# Patient Record
Sex: Female | Born: 1940 | Race: White | Hispanic: No | Smoking: Never smoker
Health system: Southern US, Community
[De-identification: ages and names within clinical notes are randomized; demographics above are authoritative.]

## PROBLEM LIST (undated history)

## (undated) DIAGNOSIS — I499 Cardiac arrhythmia, unspecified: Secondary | ICD-10-CM

## (undated) DIAGNOSIS — E079 Disorder of thyroid, unspecified: Secondary | ICD-10-CM

## (undated) DIAGNOSIS — J45909 Unspecified asthma, uncomplicated: Secondary | ICD-10-CM

## (undated) DIAGNOSIS — R002 Palpitations: Secondary | ICD-10-CM

## (undated) DIAGNOSIS — I10 Essential (primary) hypertension: Secondary | ICD-10-CM

## (undated) DIAGNOSIS — G4733 Obstructive sleep apnea (adult) (pediatric): Secondary | ICD-10-CM

## (undated) DIAGNOSIS — R7302 Impaired glucose tolerance (oral): Secondary | ICD-10-CM

## (undated) DIAGNOSIS — R011 Cardiac murmur, unspecified: Secondary | ICD-10-CM

## (undated) DIAGNOSIS — T7840XA Allergy, unspecified, initial encounter: Secondary | ICD-10-CM

## (undated) DIAGNOSIS — M8580 Other specified disorders of bone density and structure, unspecified site: Secondary | ICD-10-CM

## (undated) DIAGNOSIS — J439 Emphysema, unspecified: Secondary | ICD-10-CM

## (undated) DIAGNOSIS — R42 Dizziness and giddiness: Secondary | ICD-10-CM

## (undated) DIAGNOSIS — Z9889 Other specified postprocedural states: Secondary | ICD-10-CM

## (undated) DIAGNOSIS — G250 Essential tremor: Secondary | ICD-10-CM

## (undated) DIAGNOSIS — J449 Chronic obstructive pulmonary disease, unspecified: Secondary | ICD-10-CM

## (undated) DIAGNOSIS — Z90710 Acquired absence of both cervix and uterus: Secondary | ICD-10-CM

## (undated) HISTORY — DX: Emphysema, unspecified: J43.9

## (undated) HISTORY — DX: Acquired absence of both cervix and uterus: Z90.710

## (undated) HISTORY — DX: Cardiac arrhythmia, unspecified: I49.9

## (undated) HISTORY — DX: Unspecified asthma, uncomplicated: J45.909

## (undated) HISTORY — DX: Dizziness and giddiness: R42

## (undated) HISTORY — DX: Cardiac murmur, unspecified: R01.1

## (undated) HISTORY — DX: Obstructive sleep apnea (adult) (pediatric): G47.33

## (undated) HISTORY — DX: Disorder of thyroid, unspecified: E07.9

## (undated) HISTORY — DX: Allergy, unspecified, initial encounter: T78.40XA

## (undated) HISTORY — DX: Essential tremor: G25.0

## (undated) HISTORY — DX: Chronic obstructive pulmonary disease, unspecified: J44.9

## (undated) HISTORY — DX: Palpitations: R00.2

## (undated) HISTORY — PX: TRIGGER FINGER RELEASE: SHX641

## (undated) HISTORY — DX: Impaired glucose tolerance (oral): R73.02

## (undated) HISTORY — DX: Essential (primary) hypertension: I10

## (undated) HISTORY — PX: OTHER SURGICAL HISTORY: SHX169

## (undated) HISTORY — PX: CATARACT EXTRACTION: SUR2

## (undated) HISTORY — DX: Other specified disorders of bone density and structure, unspecified site: M85.80

---

## 2005-10-29 ENCOUNTER — Emergency Department (HOSPITAL_COMMUNITY): Admission: EM | Admit: 2005-10-29 | Discharge: 2005-10-29 | Payer: Self-pay | Admitting: Emergency Medicine

## 2005-10-30 ENCOUNTER — Ambulatory Visit (HOSPITAL_COMMUNITY): Admission: RE | Admit: 2005-10-30 | Discharge: 2005-10-30 | Payer: Self-pay | Admitting: Internal Medicine

## 2005-11-09 ENCOUNTER — Ambulatory Visit: Payer: Self-pay | Admitting: Gastroenterology

## 2005-11-09 ENCOUNTER — Encounter: Admission: RE | Admit: 2005-11-09 | Discharge: 2005-11-09 | Payer: Self-pay | Admitting: Internal Medicine

## 2005-11-25 ENCOUNTER — Ambulatory Visit (HOSPITAL_COMMUNITY): Admission: RE | Admit: 2005-11-25 | Discharge: 2005-11-25 | Payer: Self-pay | Admitting: Gastroenterology

## 2006-01-21 ENCOUNTER — Ambulatory Visit: Payer: Self-pay | Admitting: Gastroenterology

## 2006-02-01 ENCOUNTER — Ambulatory Visit: Payer: Self-pay | Admitting: Cardiovascular Disease

## 2006-02-04 ENCOUNTER — Ambulatory Visit: Payer: Self-pay | Admitting: Gastroenterology

## 2006-03-21 ENCOUNTER — Ambulatory Visit: Payer: Self-pay | Admitting: Gastroenterology

## 2006-04-29 ENCOUNTER — Ambulatory Visit (HOSPITAL_COMMUNITY): Admission: RE | Admit: 2006-04-29 | Discharge: 2006-04-29 | Payer: Self-pay | Admitting: Gastroenterology

## 2009-04-21 DIAGNOSIS — R7309 Other abnormal glucose: Secondary | ICD-10-CM | POA: Insufficient documentation

## 2009-04-21 DIAGNOSIS — E039 Hypothyroidism, unspecified: Secondary | ICD-10-CM | POA: Insufficient documentation

## 2009-04-21 DIAGNOSIS — H9319 Tinnitus, unspecified ear: Secondary | ICD-10-CM | POA: Insufficient documentation

## 2009-04-21 DIAGNOSIS — G250 Essential tremor: Secondary | ICD-10-CM | POA: Insufficient documentation

## 2009-05-29 DIAGNOSIS — E559 Vitamin D deficiency, unspecified: Secondary | ICD-10-CM | POA: Insufficient documentation

## 2010-06-11 DIAGNOSIS — E785 Hyperlipidemia, unspecified: Secondary | ICD-10-CM | POA: Insufficient documentation

## 2010-11-12 ENCOUNTER — Encounter: Payer: Self-pay | Admitting: Internal Medicine

## 2010-12-02 ENCOUNTER — Encounter: Payer: Self-pay | Admitting: Internal Medicine

## 2010-12-09 ENCOUNTER — Encounter: Payer: Self-pay | Admitting: Internal Medicine

## 2010-12-24 ENCOUNTER — Encounter: Payer: Self-pay | Admitting: Internal Medicine

## 2011-01-21 ENCOUNTER — Ambulatory Visit (INDEPENDENT_AMBULATORY_CARE_PROVIDER_SITE_OTHER): Payer: Medicare Other | Admitting: Internal Medicine

## 2011-01-21 ENCOUNTER — Encounter: Payer: Self-pay | Admitting: Internal Medicine

## 2011-01-21 DIAGNOSIS — R002 Palpitations: Secondary | ICD-10-CM

## 2011-01-21 DIAGNOSIS — I4949 Other premature depolarization: Secondary | ICD-10-CM

## 2011-01-21 DIAGNOSIS — I10 Essential (primary) hypertension: Secondary | ICD-10-CM

## 2011-01-21 DIAGNOSIS — I493 Ventricular premature depolarization: Secondary | ICD-10-CM

## 2011-01-21 MED ORDER — FLECAINIDE ACETATE 50 MG PO TABS: 50.0000 mg | ORAL_TABLET | Freq: Two times a day (BID) | ORAL | Status: DC

## 2011-01-21 NOTE — Patient Instructions (Addendum)
Your physician has requested that you have an exercise tolerance test. For further information please visit [email or web address] Please also follow instruction sheet, as given.--3-4 weeks with DrTaylor       Your physician has recommended you make the following change in your medication:  1) Start Flecainide 50mg  one twice daily 2) Decrease Metoprolol to 50mg  daily (1/2 of a 100mg  tablet)

## 2011-01-21 NOTE — Assessment & Plan Note (Signed)
The patient's symptoms appear to be related to her PVCs. We will try to suppress these.

## 2011-01-21 NOTE — Progress Notes (Signed)
HPI Kirsten [name] is referred by Dr. Clelia Croft and [facility] for evaluation of palpitations and documented PVCs and PACs. The patient is a very pleasant 70 year old woman with a history of hypertension. She has had palpitations for several months. She has never had syncope. She recently worked cardiac monitor which demonstrated symptomatic PACs and PVCs. The patient denies chest pain or shortness of breath. No Known Allergies   Current Outpatient Prescriptions  Medication Sig Dispense Refill  . amLODipine-benazepril (LOTREL) 5-20 MG per capsule Take 1 capsule by mouth daily.        Marland Kitchen aspirin 81 MG tablet Take 81 mg by mouth daily.        . Boswellia-Glucosamine-Vit D (OSTEO BI-FLEX/5-LOXIN ADVANCED) TABS Take 1 tablet by mouth daily.        Marland Kitchen CINNAMON PO Take 1,000 mg by mouth daily.        . cycloSPORINE (RESTASIS) 0.05 % ophthalmic emulsion Place 1 drop into both eyes 2 (two) times daily. Every 2 days       . estrogens, conjugated, (PREMARIN) 0.9 MG tablet Take 0.9 mg by mouth daily. Take daily for 21 days then do not take for 7 days.       . hydrochlorothiazide (MICROZIDE) 12.5 MG capsule Take 12.5 mg by mouth daily.        Marland Kitchen levothyroxine (SYNTHROID, LEVOTHROID) 50 MCG tablet Take 50 mcg by mouth daily.        . metoprolol (LOPRESSOR) 100 MG tablet Take 0.5 tablets (50 mg total) by mouth daily.      . Vitamin D, Ergocalciferol, (DRISDOL) 50000 UNITS CAPS Take 50,000 Units by mouth. Twice a month       . zolpidem (AMBIEN) 10 MG tablet Take 10 mg by mouth at bedtime as needed. 1 tablet a week       . flecainide (TAMBOCOR) 50 MG tablet Take 1 tablet (50 mg total) by mouth 2 (two) times daily.  60 tablet  11  . DISCONTD: flecainide (TAMBOCOR) 50 MG tablet Take 1 tablet (50 mg total) by mouth 2 (two) times daily.  60 tablet  11  . DISCONTD: flecainide (TAMBOCOR) 50 MG tablet Take 1 tablet (50 mg total) by mouth 2 (two) times daily.  60 tablet  11     Past Medical History  Diagnosis Date  . HTN  (hypertension)   . H/O: hysterectomy   . IGT (impaired glucose tolerance)   . Osteopenia   . Palpitations   . Heart murmur   . Tinnitus   . Vertigo   . HTN (hypertension)   . Tremor, essential     ROS:   All systems reviewed and negative except as noted in the HPI.   Past Surgical History  Procedure Date  . Orthopedic procedure      No family history on file.   History   Social History  . Marital Status: Divorced    Spouse Name: N/A    Number of Children: 0  . Years of Education: N/A   Occupational History  . retired Armed forces logistics/support/administrative officer for [employer]    Social History Main Topics  . Smoking status: Never Smoker   . Smokeless tobacco: Not on file  . Alcohol Use: No  . Drug Use: No  . Sexually Active: Not on file   Other Topics Concern  . Not on file   Social History Narrative  . No narrative on file     BP 106/50  Pulse 48  Ht 5' (  1.524 m)  Wt 55.339 kg (122 lb)  BMI 23.83 kg/m2  Physical Exam:  Well appearing woman, NAD HEENT: Unremarkable Neck:  No JVD, no thyromegally Lymphatics:  No adenopathy Back:  No CVA tenderness Lungs:  Clear with no wheezes, rales, or rhonchi. HEART:  Regular rate rhythm, no murmurs, no rubs, no clicks Abd:  soft, positive bowel sounds, no organomegally, no rebound, no guarding Ext:  2 plus pulses, no edema, no cyanosis, no clubbing Skin:  No rashes no nodules Neuro:  CN II through XII intact, motor grossly intact  EKG Sinus bradycardia.  Assess/Plan:

## 2011-01-21 NOTE — Assessment & Plan Note (Signed)
The patient has continued to have symptoms despite medical therapy with beta blockers. These initially helped but over the last few months she has had increasingly frequent palpitations which are quite bothersome to her. After discussing the treatment options with the patient and reviewing her EKG and cardiac monitor I have recommended that we start low dose flecainide. She will reduce her metoprolol from 100 mg daily to 50 mg daily. She will start flecainide 50 mg twice daily. I will have her back for an exercise treadmill test to rule out exercise induced proarrhythmia on flecainide.

## 2011-02-04 ENCOUNTER — Telehealth: Payer: Self-pay | Admitting: Internal Medicine

## 2011-02-04 DIAGNOSIS — I493 Ventricular premature depolarization: Secondary | ICD-10-CM

## 2011-02-04 MED ORDER — FLECAINIDE ACETATE 50 MG PO TABS: 50.0000 mg | ORAL_TABLET | Freq: Two times a day (BID) | ORAL | Status: DC

## 2011-02-04 NOTE — Telephone Encounter (Signed)
New message:  Patient needs a new pres called [facility] for a one year supply of Flecainide. Pt is changing pharmacy.  Please call pt if any questions. [phone]  Cell [phone]

## 2011-03-03 ENCOUNTER — Ambulatory Visit (INDEPENDENT_AMBULATORY_CARE_PROVIDER_SITE_OTHER): Payer: Medicare Other | Admitting: Internal Medicine

## 2011-03-03 DIAGNOSIS — I4949 Other premature depolarization: Secondary | ICD-10-CM | POA: Diagnosis not present

## 2011-03-03 DIAGNOSIS — I493 Ventricular premature depolarization: Secondary | ICD-10-CM

## 2011-03-03 DIAGNOSIS — R002 Palpitations: Secondary | ICD-10-CM

## 2011-03-03 NOTE — Progress Notes (Signed)
Exercise Treadmill Test  Pre-Exercise Testing Evaluation Rhythm: sinus bradycardia  Rate: 45   PR:  18 QRS:  .09  QT:  51 QTc: 44     Test  Exercise Tolerance Test Ordering MD: Lewayne Bunting, MD  Interpreting MD:  Lewayne Bunting, MD  Unique Test No: 1  Treadmill:  1  Indication for ETT: Flecainide  Contraindication to ETT: No   Stress Modality: exercise - treadmill  Cardiac Imaging Performed: non   Protocol: sub max Bruce Max BP:  121/56  Max MPHR (bpm):  151 85% MPR (bpm):  128  MPHR obtained (bpm):  77 % MPHR obtained:  n/a  Reached 85% MPHR (min:sec):  n/a Total Exercise Time (min-sec): 12:00  Workload in METS:  7.0 Borg Scale: 15  Reason ETT Terminated:  patient's desire to stop    ST Segment Analysis At Rest: normal ST segments - no evidence of significant ST depression With Exercise: no evidence of significant ST depression  Other Information Arrhythmia:  No Angina during ETT:  absent (0) Quality of ETT:  diagnostic  ETT Interpretation:  normal - no evidence of ischemia by ST analysis  Comments: Bradycardic heart rate response to exercise.  Recommendations: Continue current meds.

## 2011-06-17 ENCOUNTER — Encounter: Payer: Self-pay | Admitting: Gastroenterology

## 2011-06-17 DIAGNOSIS — R7301 Impaired fasting glucose: Secondary | ICD-10-CM | POA: Diagnosis not present

## 2011-06-17 DIAGNOSIS — M899 Disorder of bone, unspecified: Secondary | ICD-10-CM | POA: Diagnosis not present

## 2011-06-17 DIAGNOSIS — I10 Essential (primary) hypertension: Secondary | ICD-10-CM | POA: Diagnosis not present

## 2011-06-17 DIAGNOSIS — E559 Vitamin D deficiency, unspecified: Secondary | ICD-10-CM | POA: Diagnosis not present

## 2011-06-17 DIAGNOSIS — E785 Hyperlipidemia, unspecified: Secondary | ICD-10-CM | POA: Diagnosis not present

## 2011-06-17 DIAGNOSIS — E039 Hypothyroidism, unspecified: Secondary | ICD-10-CM | POA: Diagnosis not present

## 2011-06-17 DIAGNOSIS — M949 Disorder of cartilage, unspecified: Secondary | ICD-10-CM | POA: Diagnosis not present

## 2011-06-24 DIAGNOSIS — I10 Essential (primary) hypertension: Secondary | ICD-10-CM | POA: Diagnosis not present

## 2011-06-24 DIAGNOSIS — I499 Cardiac arrhythmia, unspecified: Secondary | ICD-10-CM | POA: Insufficient documentation

## 2011-06-24 DIAGNOSIS — E785 Hyperlipidemia, unspecified: Secondary | ICD-10-CM | POA: Diagnosis not present

## 2011-06-24 DIAGNOSIS — Z0000 Encounter for general adult medical examination without abnormal findings: Secondary | ICD-10-CM | POA: Diagnosis not present

## 2011-06-24 DIAGNOSIS — E039 Hypothyroidism, unspecified: Secondary | ICD-10-CM | POA: Diagnosis not present

## 2011-06-25 DIAGNOSIS — Z1212 Encounter for screening for malignant neoplasm of rectum: Secondary | ICD-10-CM | POA: Diagnosis not present

## 2011-07-14 DIAGNOSIS — N72 Inflammatory disease of cervix uteri: Secondary | ICD-10-CM | POA: Diagnosis not present

## 2011-07-26 ENCOUNTER — Ambulatory Visit (INDEPENDENT_AMBULATORY_CARE_PROVIDER_SITE_OTHER): Payer: Medicare Other | Admitting: Gastroenterology

## 2011-07-26 ENCOUNTER — Encounter: Payer: Self-pay | Admitting: Gastroenterology

## 2011-07-26 VITALS — BP 108/60 | HR 56 | Ht 60.0 in | Wt 119.8 lb

## 2011-07-26 DIAGNOSIS — Z1211 Encounter for screening for malignant neoplasm of colon: Secondary | ICD-10-CM

## 2011-07-26 NOTE — Progress Notes (Signed)
HPI: This is a   very pleasant 71 year old woman whom I last saw about 6 years ago for pancreatic imaging abnormality. I performed an endoscopic ultrasound, there is nothing significant appearing on that examination. She had a followup MRI of her pancreas in 2008 and it was completely normal. I suggested no further surveillance.  She had a colonoscopy about 8 years ago, she was told to have this again in 7 years.  It was "perfect."  No polyps or cancer.  No FH of colon cancer.  No changes in bowels no blood in her stool.  Overall she has lost 35 pounds, intentionally with dieting.    Review of systems: Pertinent positive and negative review of systems were noted in the above HPI section. Complete review of systems was performed and was otherwise normal.    Past Medical History  Diagnosis Date  . HTN (hypertension)   . H/O: hysterectomy   . IGT (impaired glucose tolerance)   . Osteopenia   . Palpitations   . Heart murmur   . Tinnitus   . Vertigo   . HTN (hypertension)   . Tremor, essential     Past Surgical History  Procedure Date  . Orthopedic procedure     Current Outpatient Prescriptions  Medication Sig Dispense Refill  . amLODipine-benazepril (LOTREL) 5-20 MG per capsule Take 1 capsule by mouth daily.        Marland Kitchen aspirin 81 MG tablet Take 81 mg by mouth daily.        . Boswellia-Glucosamine-Vit D (OSTEO BI-FLEX/5-LOXIN ADVANCED) TABS Take 1 tablet by mouth daily.        Marland Kitchen CINNAMON PO Take 1,000 mg by mouth daily.        . cycloSPORINE (RESTASIS) 0.05 % ophthalmic emulsion Place 1 drop into both eyes 2 (two) times daily. Every 2 days       . estrogens, conjugated, (PREMARIN) 0.9 MG tablet Take 0.9 mg by mouth daily. Take daily for 21 days then do not take for 7 days.       . flecainide (TAMBOCOR) 50 MG tablet Take 1 tablet (50 mg total) by mouth 2 (two) times daily.  180 tablet  3  . hydrochlorothiazide (MICROZIDE) 12.5 MG capsule Take 12.5 mg by mouth daily.        Marland Kitchen  levothyroxine (SYNTHROID, LEVOTHROID) 50 MCG tablet Take 50 mcg by mouth daily.        . metoprolol (LOPRESSOR) 100 MG tablet Take 0.5 tablets (50 mg total) by mouth daily.      . Vitamin D, Ergocalciferol, (DRISDOL) 50000 UNITS CAPS Take 50,000 Units by mouth. Twice a month       . zolpidem (AMBIEN) 10 MG tablet Take 10 mg by mouth at bedtime as needed. 1 tablet a week         Allergies as of 07/26/2011  . (No Known Allergies)    No family history on file.  History   Social History  . Marital Status: Divorced    Spouse Name: N/A    Number of Children: 0  . Years of Education: N/A   Occupational History  . retired Armed forces logistics/support/administrative officer for [employer]    Social History Main Topics  . Smoking status: Never Smoker   . Smokeless tobacco: Never Used  . Alcohol Use: No  . Drug Use: No  . Sexually Active: Not on file   Other Topics Concern  . Not on file   Social History Narrative  . No  narrative on file       Physical Exam: BP 108/60  Pulse 56  Ht 5' (1.524 m)  Wt 119 lb 12.8 oz (54.341 kg)  BMI 23.40 kg/m2 Constitutional: generally well-appearing Psychiatric: alert and oriented x3 Eyes: extraocular movements intact Mouth: oral pharynx moist, no lesions Neck: supple no lymphadenopathy Cardiovascular: heart regular rate and rhythm Lungs: clear to auscultation bilaterally Abdomen: soft, nontender, nondistended, no obvious ascites, no peritoneal signs, normal bowel sounds Extremities: no lower extremity edema bilaterally Skin: no lesions on visible extremities    Assessment and plan: 71 y.o. female with  routine risk for colon cancer, no new GI symptoms  The last colonoscopy was about 8 years ago, she was told that a repeat 7 year interval. Those national consensus guidelines has since changed and she is probably safe for another 2 years before repeating colonoscopy. She does not require annual fecal occult testing in the interim. This assumes she has no  significant GI symptoms and she does not. We will therefore put her in our reminder system for repeat colonoscopy in the fall of 2015. She does not remember who did her previous colonoscopy or where it was done exactly but she is very certain about the date and the findings it was completely normal.

## 2011-07-26 NOTE — Patient Instructions (Signed)
Recall colonoscopy for routine risk September 2015 as long as you have no symptoms. You do not need annual fecal occult testing for colon cancer screening. A copy of this information will be made available to Dr. Clelia Croft.

## 2011-10-21 DIAGNOSIS — Z1231 Encounter for screening mammogram for malignant neoplasm of breast: Secondary | ICD-10-CM | POA: Diagnosis not present

## 2011-10-27 ENCOUNTER — Ambulatory Visit: Payer: Medicare Other | Admitting: Internal Medicine

## 2011-11-22 ENCOUNTER — Ambulatory Visit (INDEPENDENT_AMBULATORY_CARE_PROVIDER_SITE_OTHER): Payer: Medicare Other | Admitting: Internal Medicine

## 2011-11-22 ENCOUNTER — Encounter: Payer: Self-pay | Admitting: Internal Medicine

## 2011-11-22 VITALS — BP 118/57 | HR 42 | Ht 60.0 in | Wt 119.8 lb

## 2011-11-22 DIAGNOSIS — I493 Ventricular premature depolarization: Secondary | ICD-10-CM

## 2011-11-22 DIAGNOSIS — I4949 Other premature depolarization: Secondary | ICD-10-CM

## 2011-11-22 DIAGNOSIS — R002 Palpitations: Secondary | ICD-10-CM

## 2011-11-22 DIAGNOSIS — I10 Essential (primary) hypertension: Secondary | ICD-10-CM | POA: Diagnosis not present

## 2011-11-22 MED ORDER — FLECAINIDE ACETATE 50 MG PO TABS: 50.0000 mg | ORAL_TABLET | Freq: Two times a day (BID) | ORAL | Status: DC

## 2011-11-22 NOTE — Progress Notes (Signed)
HPI Kirsten [name] returns today for followup. She is a very pleasant 71 year old woman with symptomatic palpitations and documented PACs and PVCs. When I saw her back almost a year ago, I recommended that she start taking flecainide. Her symptoms have markedly improved. She denies chest pain or shortness of breath. She still has an occasional palpitation but nothing like she had previously. No syncope. She does note occasional bizarre dreams. No Known Allergies   Current Outpatient Prescriptions  Medication Sig Dispense Refill  . amLODipine-benazepril (LOTREL) 5-20 MG per capsule Take 1 capsule by mouth daily.        Marland Kitchen aspirin 81 MG tablet Take 81 mg by mouth daily.        . benazepril (LOTENSIN) 20 MG tablet Take 20 mg by mouth daily.      . Boswellia-Glucosamine-Vit D (OSTEO BI-FLEX/5-LOXIN ADVANCED) TABS Take 1 tablet by mouth daily.        Marland Kitchen CINNAMON PO Take 1,000 mg by mouth daily.        . cycloSPORINE (RESTASIS) 0.05 % ophthalmic emulsion Place 1 drop into both eyes 2 (two) times daily. Every 2 days       . estrogens, conjugated, (PREMARIN) 0.9 MG tablet Take 0.9 mg by mouth daily. Take daily for 21 days then do not take for 7 days.       . flecainide (TAMBOCOR) 50 MG tablet Take 1 tablet (50 mg total) by mouth 2 (two) times daily.  180 tablet  3  . hydrochlorothiazide (MICROZIDE) 12.5 MG capsule Take 12.5 mg by mouth daily.        Marland Kitchen levothyroxine (SYNTHROID, LEVOTHROID) 50 MCG tablet Take 50 mcg by mouth daily.        . metoprolol (LOPRESSOR) 100 MG tablet Take 0.5 tablets (50 mg total) by mouth daily.      . Vitamin D, Ergocalciferol, (DRISDOL) 50000 UNITS CAPS Take 50,000 Units by mouth. Twice a month       . zolpidem (AMBIEN) 10 MG tablet Take 10 mg by mouth at bedtime as needed. 1 tablet a week          Past Medical History  Diagnosis Date  . HTN (hypertension)   . H/O: hysterectomy   . IGT (impaired glucose tolerance)   . Osteopenia   . Palpitations   . Heart murmur   .  Tinnitus   . Vertigo   . HTN (hypertension)   . Tremor, essential     ROS:   All systems reviewed and negative except as noted in the HPI.   Past Surgical History  Procedure Date  . Orthopedic procedure      No family history on file.   History   Social History  . Marital Status: Divorced    Spouse Name: N/A    Number of Children: 0  . Years of Education: N/A   Occupational History  . retired Armed forces logistics/support/administrative officer for [employer]    Social History Main Topics  . Smoking status: Never Smoker   . Smokeless tobacco: Never Used  . Alcohol Use: No  . Drug Use: No  . Sexually Active: Not on file   Other Topics Concern  . Not on file   Social History Narrative  . No narrative on file     BP 118/57  Pulse 42  Ht 5' (1.524 m)  Wt 119 lb 12.8 oz (54.341 kg)  BMI 23.40 kg/m2  Physical Exam:  Well appearing 71 year old woman, NAD HEENT: Unremarkable Neck:  No JVD, no thyromegally Lungs:  Clear with no wheezes, rales, or rhonchi. HEART:  Regular bradycardic rhythm, no murmurs, no rubs, no clicks Abd:  soft, positive bowel sounds, no organomegally, no rebound, no guarding Ext:  2 plus pulses, no edema, no cyanosis, no clubbing Skin:  No rashes no nodules Neuro:  CN II through XII intact, motor grossly intact  EKG Sinus bradycardia  Assess/Plan:

## 2011-11-22 NOTE — Patient Instructions (Addendum)
SENT IN A ONE YEAR SUPPLY FOR YOUR FLECANIDE  Your physician wants you to follow-up in: ONE YEAR WITH DR. Ladona Ridgel. You will receive a reminder letter in the mail two months in advance. If you don't receive a letter, please call our office to schedule the follow-up appointment.   Your physician recommends that you continue on your current medications as directed. Please refer to the Current Medication list given to you today.

## 2011-11-22 NOTE — Assessment & Plan Note (Signed)
Her symptoms remain well-controlled. She will continue low-dose flecainide, and low-dose beta blocker. I plan to see her back in one year.

## 2011-11-22 NOTE — Assessment & Plan Note (Signed)
Her blood pressure is well controlled. She will continue her current medical therapy and maintain a low-sodium diet. 

## 2011-12-18 DIAGNOSIS — Z23 Encounter for immunization: Secondary | ICD-10-CM | POA: Diagnosis not present

## 2012-02-05 ENCOUNTER — Other Ambulatory Visit: Payer: Self-pay | Admitting: Internal Medicine

## 2012-06-30 ENCOUNTER — Encounter: Payer: Self-pay | Admitting: Internal Medicine

## 2012-06-30 DIAGNOSIS — R7301 Impaired fasting glucose: Secondary | ICD-10-CM | POA: Diagnosis not present

## 2012-06-30 DIAGNOSIS — E785 Hyperlipidemia, unspecified: Secondary | ICD-10-CM | POA: Diagnosis not present

## 2012-06-30 DIAGNOSIS — E039 Hypothyroidism, unspecified: Secondary | ICD-10-CM | POA: Diagnosis not present

## 2012-06-30 DIAGNOSIS — I10 Essential (primary) hypertension: Secondary | ICD-10-CM | POA: Diagnosis not present

## 2012-06-30 DIAGNOSIS — E559 Vitamin D deficiency, unspecified: Secondary | ICD-10-CM | POA: Diagnosis not present

## 2012-07-06 DIAGNOSIS — I10 Essential (primary) hypertension: Secondary | ICD-10-CM | POA: Diagnosis not present

## 2012-07-06 DIAGNOSIS — I498 Other specified cardiac arrhythmias: Secondary | ICD-10-CM | POA: Diagnosis not present

## 2012-07-06 DIAGNOSIS — Z1331 Encounter for screening for depression: Secondary | ICD-10-CM | POA: Diagnosis not present

## 2012-07-06 DIAGNOSIS — E039 Hypothyroidism, unspecified: Secondary | ICD-10-CM | POA: Diagnosis not present

## 2012-07-06 DIAGNOSIS — R7301 Impaired fasting glucose: Secondary | ICD-10-CM | POA: Diagnosis not present

## 2012-07-06 DIAGNOSIS — M899 Disorder of bone, unspecified: Secondary | ICD-10-CM | POA: Diagnosis not present

## 2012-07-06 DIAGNOSIS — M949 Disorder of cartilage, unspecified: Secondary | ICD-10-CM | POA: Diagnosis not present

## 2012-07-06 DIAGNOSIS — E785 Hyperlipidemia, unspecified: Secondary | ICD-10-CM | POA: Diagnosis not present

## 2012-07-06 DIAGNOSIS — Z0000 Encounter for general adult medical examination without abnormal findings: Secondary | ICD-10-CM | POA: Diagnosis not present

## 2012-07-06 DIAGNOSIS — E559 Vitamin D deficiency, unspecified: Secondary | ICD-10-CM | POA: Diagnosis not present

## 2012-07-12 DIAGNOSIS — Z1212 Encounter for screening for malignant neoplasm of rectum: Secondary | ICD-10-CM | POA: Diagnosis not present

## 2012-07-13 DIAGNOSIS — N8111 Cystocele, midline: Secondary | ICD-10-CM | POA: Diagnosis not present

## 2012-07-13 DIAGNOSIS — N760 Acute vaginitis: Secondary | ICD-10-CM | POA: Diagnosis not present

## 2012-07-13 DIAGNOSIS — Z01419 Encounter for gynecological examination (general) (routine) without abnormal findings: Secondary | ICD-10-CM | POA: Diagnosis not present

## 2012-07-13 DIAGNOSIS — I10 Essential (primary) hypertension: Secondary | ICD-10-CM | POA: Diagnosis not present

## 2012-07-14 ENCOUNTER — Other Ambulatory Visit (HOSPITAL_COMMUNITY): Payer: Self-pay | Admitting: Internal Medicine

## 2012-07-24 DIAGNOSIS — R0609 Other forms of dyspnea: Secondary | ICD-10-CM | POA: Diagnosis not present

## 2012-07-26 ENCOUNTER — Ambulatory Visit (HOSPITAL_COMMUNITY)
Admission: RE | Admit: 2012-07-26 | Discharge: 2012-07-26 | Disposition: A | Discharge status: Home / Self Care | Payer: Medicare Other | Source: Ambulatory Visit | Attending: Internal Medicine | Admitting: Internal Medicine

## 2012-07-26 DIAGNOSIS — R0609 Other forms of dyspnea: Secondary | ICD-10-CM | POA: Insufficient documentation

## 2012-07-26 DIAGNOSIS — R0989 Other specified symptoms and signs involving the circulatory and respiratory systems: Secondary | ICD-10-CM | POA: Insufficient documentation

## 2012-07-26 LAB — PULMONARY FUNCTION TEST

## 2012-07-26 MED ORDER — ALBUTEROL SULFATE (5 MG/ML) 0.5% IN NEBU
2.5000 mg | INHALATION_SOLUTION | Freq: Once | RESPIRATORY_TRACT | Status: AC
  Administered 2012-07-26: 2.5 mg via RESPIRATORY_TRACT

## 2012-08-14 DIAGNOSIS — R0989 Other specified symptoms and signs involving the circulatory and respiratory systems: Secondary | ICD-10-CM | POA: Diagnosis not present

## 2012-08-14 DIAGNOSIS — I498 Other specified cardiac arrhythmias: Secondary | ICD-10-CM | POA: Diagnosis not present

## 2012-08-14 DIAGNOSIS — IMO0002 Reserved for concepts with insufficient information to code with codable children: Secondary | ICD-10-CM | POA: Diagnosis not present

## 2012-08-14 DIAGNOSIS — J449 Chronic obstructive pulmonary disease, unspecified: Secondary | ICD-10-CM | POA: Diagnosis not present

## 2012-08-16 ENCOUNTER — Ambulatory Visit (INDEPENDENT_AMBULATORY_CARE_PROVIDER_SITE_OTHER): Payer: Medicare Other | Admitting: Pulmonary Disease

## 2012-08-16 ENCOUNTER — Institutional Professional Consult (permissible substitution): Payer: Medicare Other | Admitting: Pulmonary Disease

## 2012-08-16 ENCOUNTER — Encounter: Payer: Self-pay | Admitting: Pulmonary Disease

## 2012-08-16 VITALS — BP 98/62 | HR 63 | Temp 98.0°F | Ht 59.0 in | Wt 114.0 lb

## 2012-08-16 DIAGNOSIS — J449 Chronic obstructive pulmonary disease, unspecified: Secondary | ICD-10-CM

## 2012-08-16 DIAGNOSIS — J4489 Other specified chronic obstructive pulmonary disease: Secondary | ICD-10-CM

## 2012-08-16 MED ORDER — MOMETASONE FURO-FORMOTEROL FUM 100-5 MCG/ACT IN AERO: 2.0000 | INHALATION_SPRAY | Freq: Two times a day (BID) | RESPIRATORY_TRACT | Status: DC

## 2012-08-16 NOTE — Progress Notes (Deleted)
  Subjective:    Patient ID: Kirsten [name], female    DOB: 08-26-1940, 72 y.o.   MRN: [id number]  HPI    Review of Systems  Constitutional: Positive for unexpected weight change. Negative for fever, chills, diaphoresis, activity change, appetite change and fatigue.  HENT: Positive for congestion, sneezing and dental problem. Negative for hearing loss, ear pain, nosebleeds, sore throat, facial swelling, rhinorrhea, mouth sores, trouble swallowing, neck pain, neck stiffness, voice change, postnasal drip, sinus pressure, tinnitus and ear discharge.   Eyes: Negative for photophobia, discharge, itching and visual disturbance.  Respiratory: Positive for cough and shortness of breath. Negative for apnea, choking, chest tightness, wheezing and stridor.   Cardiovascular: Negative for chest pain, palpitations and leg swelling.  Gastrointestinal: Negative for nausea, vomiting, abdominal pain, constipation, blood in stool and abdominal distention.  Genitourinary: Negative for dysuria, urgency, frequency, hematuria, flank pain, decreased urine volume and difficulty urinating.  Musculoskeletal: Positive for joint swelling. Negative for myalgias, back pain, arthralgias and gait problem.  Skin: Negative for color change, pallor and rash.  Neurological: Negative for dizziness, tremors, seizures, syncope, speech difficulty, weakness, light-headedness, numbness and headaches.  Hematological: Negative for adenopathy. Does not bruise/bleed easily.  Psychiatric/Behavioral: Negative for confusion, sleep disturbance and agitation. The patient is not nervous/anxious.        Objective:   Physical Exam        Assessment & Plan:

## 2012-08-16 NOTE — Progress Notes (Signed)
Chief Complaint  Patient presents with  . Pulmonary Consult    referred by Dr. Clelia Croft for COPD    History of Present Illness: Kirsten [name] is a 72 y.o. female never smoker for evaluation of dyspnea.  She has noticed trouble with her breathing for some time.  Things have gotten progressively worse for the past one year.  She now feels like she has trouble breathing even at rest, and will feel like she has to double clutch her breath when talking.  She has been getting episodes of cough with yellow to green sputum.  She also has episodes of wheezing.  She denies hemoptysis, and there is no prior history of asthma or allergies.  She never smoked.  Her husband used to smoke 1 pack per day.  She used to work with [employer], and had frequent exposures.  She was required to wear a respirator at times while working.    She gets winded after walking about 150 feet.  Her heart feels like it is pounding when she exerts herself.  She denies leg swelling or skin rash.  There is no history of thrombo-embolic disease.  She denies history of pneumonia.  There is no history of animal exposures.  She has been on ACE inhibitor for several years, and her use of this medication predates her current symptoms by several years.    She recently had a chest xray which was normal.  She also had breathing tests, and was told she has COPD.  She was prescribed spiriva and albuterol, and referred to pulmonary.  She has not started inhaler therapy yet.  Tests: CXR 07/24/12 >> no acute process PFT 07/26/12 >> FEV1 1.38 (81%), post-BD FEV1% 79, TLC 5.72 (113%), RV 3.01 (158%), DLCO 60%, +BD response  Kirsten [name]  has a past medical history of HTN (hypertension); H/O: hysterectomy; IGT (impaired glucose tolerance); Osteopenia; Palpitations; Heart murmur; Tinnitus; Vertigo; HTN (hypertension); and Tremor, essential.  Kirsten [name]  has past surgical history that includes orthopedic procedure.  Prior to Admission medications    Medication Sig Start Date End Date Taking? Authorizing Provider  albuterol (PROAIR HFA) 108 (90 BASE) MCG/ACT inhaler Inhale 2 puffs into the lungs every 6 (six) hours as needed for wheezing.   Yes Historical Provider, MD  amLODipine-benazepril (LOTREL) 5-20 MG per capsule Take 1 capsule by mouth daily.   Yes Historical Provider, MD  aspirin 81 MG tablet Take 81 mg by mouth daily.     Yes Historical Provider, MD  Boswellia-Glucosamine-Vit D (OSTEO BI-FLEX/5-LOXIN ADVANCED) TABS Take 1 tablet by mouth daily.     Yes Historical Provider, MD  CINNAMON PO Take 1,000 mg by mouth daily.     Yes Historical Provider, MD  cycloSPORINE (RESTASIS) 0.05 % ophthalmic emulsion Place 1 drop into both eyes 2 (two) times daily. Every 2 days    Yes Historical Provider, MD  estrogens, conjugated, (PREMARIN) 0.9 MG tablet Take 0.9 mg by mouth daily. Take daily for 21 days then do not take for 7 days.    Yes Historical Provider, MD  flecainide (TAMBOCOR) 50 MG tablet TAKE 1 TABLET TWICE A DAY 02/05/12  Yes Marinus Maw, MD  hydrochlorothiazide (MICROZIDE) 12.5 MG capsule Take 12.5 mg by mouth daily.     Yes Historical Provider, MD  levothyroxine (SYNTHROID, LEVOTHROID) 50 MCG tablet Take 50 mcg by mouth daily.     Yes Historical Provider, MD  tiotropium (SPIRIVA) 18 MCG inhalation capsule Place 18 mcg into inhaler  and inhale daily.   Yes Historical Provider, MD  Vitamin D, Ergocalciferol, (DRISDOL) 50000 UNITS CAPS Take 50,000 Units by mouth. Twice a month    Yes Historical Provider, MD  zolpidem (AMBIEN) 10 MG tablet Take 10 mg by mouth at bedtime as needed. 1 tablet a week    Yes Historical Provider, MD  metoprolol (LOPRESSOR) 100 MG tablet Take 0.5 tablets (50 mg total) by mouth daily. 01/21/11   Marinus Maw, MD    No Known Allergies  Her family history includes Cirrhosis in her mother; Heart disease in her maternal grandmother; Kidney disease in her mother; and Lung cancer in her father.  She  reports  that she has never smoked. She has never used smokeless tobacco. She reports that she does not drink alcohol or use illicit drugs.  Review of Systems  Constitutional: Positive for unexpected weight change. Negative for fever, chills, diaphoresis, activity change, appetite change and fatigue.  HENT: Positive for congestion, sneezing and dental problem. Negative for hearing loss, ear pain, nosebleeds, sore throat, facial swelling, rhinorrhea, mouth sores, trouble swallowing, neck pain, neck stiffness, voice change, postnasal drip, sinus pressure, tinnitus and ear discharge.   Eyes: Negative for photophobia, discharge, itching and visual disturbance.  Respiratory: Positive for cough and shortness of breath. Negative for apnea, choking, chest tightness, wheezing and stridor.   Cardiovascular: Negative for chest pain, palpitations and leg swelling.  Gastrointestinal: Negative for nausea, vomiting, abdominal pain, constipation, blood in stool and abdominal distention.  Genitourinary: Negative for dysuria, urgency, frequency, hematuria, flank pain, decreased urine volume and difficulty urinating.  Musculoskeletal: Positive for joint swelling. Negative for myalgias, back pain, arthralgias and gait problem.  Skin: Negative for color change, pallor and rash.  Neurological: Negative for dizziness, tremors, seizures, syncope, speech difficulty, weakness, light-headedness, numbness and headaches.  Hematological: Negative for adenopathy. Does not bruise/bleed easily.  Psychiatric/Behavioral: Negative for confusion, sleep disturbance and agitation. The patient is not nervous/anxious.    Physical Exam:  General - No distress, thin ENT - No sinus tenderness, no oral exudate, no LAN, no thyromegaly, TM clear, pupils equal/reactive Cardiac - s1s2 regular, no murmur, pulses symmetric Chest - No wheeze/rales/dullness, good air entry, normal respiratory excursion Back - No focal tenderness Abd - Soft, non-tender,  no organomegaly, + bowel sounds Ext - No edema Neuro - Normal strength, cranial nerves intact Skin - No rashes Psych - Normal mood, and behavior   Assessment/Plan:  Kirsten Helling, MD [place] Pager:  [phone]

## 2012-08-16 NOTE — Patient Instructions (Signed)
Spiriva one puff daily Dulera two puffs twice per day, and rinse mouth after each use Proair two puffs as needed Follow up in 4 weeks

## 2012-08-16 NOTE — Assessment & Plan Note (Signed)
She has progressive symptoms of dyspnea.  She has prior history of second hand tobacco exposure, and possible occupational exposure.  PFT shows airflow obstruction, air trapping, response to bronchodilator therapy, and mild diffusion defect.  I have reviewed her test results with her.  I have advised her to start spiriva as prescribed by Dr. Clelia Croft.  Will also give her trial of dulera (samples give).  She can use proair as needed.  Reviewed proper indications and use of her inhalers.  Depending on her symptoms status will determine if further interventions are needed.

## 2012-08-29 ENCOUNTER — Encounter: Payer: Self-pay | Admitting: Internal Medicine

## 2012-08-29 ENCOUNTER — Encounter (INDEPENDENT_AMBULATORY_CARE_PROVIDER_SITE_OTHER): Payer: Medicare Other

## 2012-08-29 ENCOUNTER — Ambulatory Visit (INDEPENDENT_AMBULATORY_CARE_PROVIDER_SITE_OTHER): Payer: Medicare Other | Admitting: Internal Medicine

## 2012-08-29 ENCOUNTER — Encounter: Payer: Self-pay | Admitting: *Deleted

## 2012-08-29 VITALS — BP 116/67 | HR 69 | Ht 60.0 in | Wt 112.2 lb

## 2012-08-29 DIAGNOSIS — I10 Essential (primary) hypertension: Secondary | ICD-10-CM | POA: Diagnosis not present

## 2012-08-29 DIAGNOSIS — R002 Palpitations: Secondary | ICD-10-CM

## 2012-08-29 MED ORDER — VERAPAMIL HCL ER 120 MG PO TBCR: 120.0000 mg | EXTENDED_RELEASE_TABLET | Freq: Every day | ORAL | Status: DC

## 2012-08-29 NOTE — Assessment & Plan Note (Signed)
The etiology of her palpitations is unclear. I recommended that she wear a 48 hour cardiac monitor to better understand her mechanism. She will initiate verapamil therapy.

## 2012-08-29 NOTE — Patient Instructions (Addendum)
Your physician has recommended that you wear a holter monitor. Holter monitors are medical devices that record the heart's electrical activity. Doctors most often use these monitors to diagnose arrhythmias. Arrhythmias are problems with the speed or rhythm of the heartbeat. The monitor is a small, portable device. You can wear one while you do your normal daily activities. This is usually used to diagnose what is causing palpitations/syncope (passing out). You will wear for 48 hours.  Your physician has recommended you make the following change in your medication: stop taking Lotrel and start taking Verapamil 120 mg daily  Your physician recommends that you schedule a follow-up appointment in: 6 to 8 weeks

## 2012-08-29 NOTE — Assessment & Plan Note (Signed)
Her blood pressure is well controlled. I've asked the patient to discontinue taking Lotrel and start taking verapamil. We'll need to keep in on her blood pressure.

## 2012-08-29 NOTE — Progress Notes (Signed)
HPI Kirsten [name] returns today for followup. She is a history of paroxysmal atrial arrhythmias and has been on flecainide therapy. In addition, the patient has had worsening palpitations, which have increased with the initiation of bronchodilator therapy. In the interim, she denies syncope or chest pain. She does have daily palpitations of uncertain etiology. She has worsening shortness of breath. She was recently seen by a pulmonologist and diagnosed with COPD and asthma. Bronchodilators were recommended that she has had difficulty with the secondary to palpitations. No Known Allergies   Current Outpatient Prescriptions  Medication Sig Dispense Refill  . albuterol (PROAIR HFA) 108 (90 BASE) MCG/ACT inhaler Inhale 2 puffs into the lungs every 6 (six) hours as needed for wheezing.      Marland Kitchen aspirin 81 MG tablet Take 81 mg by mouth daily.        . Boswellia-Glucosamine-Vit D (OSTEO BI-FLEX/5-LOXIN ADVANCED) TABS Take 1 tablet by mouth daily.        Marland Kitchen CINNAMON PO Take 1,000 mg by mouth daily.        . cycloSPORINE (RESTASIS) 0.05 % ophthalmic emulsion Place 1 drop into both eyes 2 (two) times daily. Every 2 days       . estrogens, conjugated, (PREMARIN) 0.9 MG tablet Take 0.9 mg by mouth daily. Take daily for 21 days then do not take for 7 days.       . flecainide (TAMBOCOR) 50 MG tablet TAKE 1 TABLET TWICE A DAY  180 tablet  2  . hydrochlorothiazide (MICROZIDE) 12.5 MG capsule Take 12.5 mg by mouth daily.        Marland Kitchen levothyroxine (SYNTHROID, LEVOTHROID) 50 MCG tablet Take 50 mcg by mouth daily.        . mometasone-formoterol (DULERA) 100-5 MCG/ACT AERO Inhale 2 puffs into the lungs 2 (two) times daily.  1 Inhaler  5  . tiotropium (SPIRIVA) 18 MCG inhalation capsule Place 18 mcg into inhaler and inhale daily.      . Vitamin D, Ergocalciferol, (DRISDOL) 50000 UNITS CAPS Take 50,000 Units by mouth. Twice a month       . zolpidem (AMBIEN) 10 MG tablet Take 10 mg by mouth at bedtime as needed. 1 tablet a week        . verapamil (CALAN-SR) 120 MG CR tablet Take 1 tablet (120 mg total) by mouth at bedtime.  30 tablet  2   No current facility-administered medications for this visit.     Past Medical History  Diagnosis Date  . HTN (hypertension)   . H/O: hysterectomy   . IGT (impaired glucose tolerance)   . Osteopenia   . Palpitations   . Heart murmur   . Tinnitus   . Vertigo   . HTN (hypertension)   . Tremor, essential     ROS:   All systems reviewed and negative except as noted in the HPI.   Past Surgical History  Procedure Laterality Date  . Orthopedic procedure       Family History  Problem Relation Age of Onset  . Kidney disease Mother   . Cirrhosis Mother   . Lung cancer Father   . Heart disease Maternal Grandmother      History   Social History  . Marital Status: Divorced    Spouse Name: N/A    Number of Children: 0  . Years of Education: N/A   Occupational History  . retired Armed forces logistics/support/administrative officer for [employer]    Social History Main Topics  . Smoking status:  Never Smoker   . Smokeless tobacco: Never Used  . Alcohol Use: No  . Drug Use: No  . Sexually Active: Not on file   Other Topics Concern  . Not on file   Social History Narrative  . No narrative on file     BP 116/67  Pulse 69  Ht 5' (1.524 m)  Wt 112 lb 3.2 oz (50.894 kg)  BMI 21.91 kg/m2  Physical Exam:  Well appearing 72 year old woman, NAD HEENT: Unremarkable Neck:  7 cm JVD, no thyromegally Lungs:  Scattered wheezes bilaterally with no increased work of breathing. HEART:  Regular rate rhythm, no murmurs, no rubs, no clicks Abd:  soft, positive bowel sounds, no organomegally, no rebound, no guarding Ext:  2 plus pulses, no edema, no cyanosis, no clubbing Skin:  No rashes no nodules Neuro:  CN II through XII intact, motor grossly intact  EKG - Normal sinus rhythm   Assess/Plan:

## 2012-08-29 NOTE — Progress Notes (Signed)
Patient ID: Kirsten [name], female   DOB: [birth year], 72 y.o.   MRN: [id number] EVO 48 Hour Holter Monitor applied to patient.

## 2012-09-11 ENCOUNTER — Encounter: Payer: Self-pay | Admitting: Pulmonary Disease

## 2012-09-11 ENCOUNTER — Institutional Professional Consult (permissible substitution): Payer: Medicare Other | Admitting: Pulmonary Disease

## 2012-09-11 ENCOUNTER — Ambulatory Visit (INDEPENDENT_AMBULATORY_CARE_PROVIDER_SITE_OTHER): Payer: Medicare Other | Admitting: Pulmonary Disease

## 2012-09-11 VITALS — BP 140/80 | HR 69 | Temp 98.0°F | Ht 59.0 in | Wt 115.4 lb

## 2012-09-11 DIAGNOSIS — J449 Chronic obstructive pulmonary disease, unspecified: Secondary | ICD-10-CM

## 2012-09-11 MED ORDER — BECLOMETHASONE DIPROPIONATE 80 MCG/ACT IN AERS: 1.0000 | INHALATION_SPRAY | Freq: Two times a day (BID) | RESPIRATORY_TRACT | Status: DC

## 2012-09-11 NOTE — Assessment & Plan Note (Signed)
She has some improvement in symptoms with inhaler therapy.  Will continue spiriva for now.  Will switch from dulera to Qvar >> hopefully removal of LABA will improve her palpitations.  She is to continue prn proair >> discussed indications for using rescue inhaler.  May need to consider changing proair to xopenex if her palpitations persist.

## 2012-09-11 NOTE — Patient Instructions (Signed)
Stop dulera Qvar two puffs twice per day, and rinse mouth after each use Continue spiriva one puff daily Proair two puffs up to four times per day as needed Follow up in 2 months

## 2012-09-11 NOTE — Progress Notes (Signed)
Chief Complaint  Patient presents with  . Follow-up    Pt states she feels some improvement in SOB. Pt states SOB is worse when she walks up any kind if slope, but otherwise improved.     History of Present Illness: Kirsten [name] is a 72 y.o. 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  Since her last visit she was seen by cardiology for palpitations.  She feels this is related to use of dulera.  She was started on verapamil.  She feels her breathing has improved otherwise.  She uses spiriva in the morning, and dulera at mid-day and in the evening.  She has noticed more trouble with her sleep if she uses dulera too close to bed time.  She has noticed intermittent episodes of cough and wheezing >> these got better after using her dulera.  She was uncertain about when she needed to use proair, and has not used it.   TESTS: CXR 07/24/12 >> no acute process PFT 07/26/12 >> FEV1 1.38 (81%), post-BD FEV1% 79, TLC 5.72 (113%), RV 3.01 (158%), DLCO 60%, +BD response  Kirsten [name]  has a past medical history of HTN (hypertension); H/O: hysterectomy; IGT (impaired glucose tolerance); Osteopenia; Palpitations; Heart murmur; Tinnitus; Vertigo; HTN (hypertension); and Tremor, essential.  Kirsten [name]  has past surgical history that includes orthopedic procedure.  Prior to Admission medications   Medication Sig Start Date End Date Taking? Authorizing Provider  albuterol (PROAIR HFA) 108 (90 BASE) MCG/ACT inhaler Inhale 2 puffs into the lungs every 6 (six) hours as needed for wheezing.   Yes Historical Provider, MD  aspirin 81 MG tablet Take 81 mg by mouth daily.     Yes Historical Provider, MD  Boswellia-Glucosamine-Vit D (OSTEO BI-FLEX/5-LOXIN ADVANCED) TABS Take 1 tablet by mouth daily.     Yes Historical Provider, MD  CINNAMON PO Take 1,000 mg by mouth daily.     Yes Historical Provider, MD  cycloSPORINE (RESTASIS) 0.05 % ophthalmic emulsion Place 1 drop into both eyes 2 (two) times daily. Every 2 days    Yes  Historical Provider, MD  estrogens, conjugated, (PREMARIN) 0.9 MG tablet Take 0.9 mg by mouth daily. Take daily for 21 days then do not take for 7 days.    Yes Historical Provider, MD  flecainide (TAMBOCOR) 50 MG tablet TAKE 1 TABLET TWICE A DAY 02/05/12  Yes Kirsten Maw, MD  hydrochlorothiazide (MICROZIDE) 12.5 MG capsule Take 12.5 mg by mouth daily.     Yes Historical Provider, MD  levothyroxine (SYNTHROID, LEVOTHROID) 50 MCG tablet Take 50 mcg by mouth daily.     Yes Historical Provider, MD  mometasone-formoterol (DULERA) 100-5 MCG/ACT AERO Inhale 2 puffs into the lungs 2 (two) times daily. 08/16/12  Yes Kirsten Helling, MD  tiotropium (SPIRIVA) 18 MCG inhalation capsule Place 18 mcg into inhaler and inhale daily.   Yes Historical Provider, MD  verapamil (CALAN-SR) 120 MG CR tablet Take 1 tablet (120 mg total) by mouth at bedtime. 08/29/12  Yes Kirsten Maw, MD  Vitamin D, Ergocalciferol, (DRISDOL) 50000 UNITS CAPS Take 50,000 Units by mouth. Twice a month    Yes Historical Provider, MD  zolpidem (AMBIEN) 10 MG tablet Take 10 mg by mouth at bedtime as needed. 1 tablet a week    Yes Historical Provider, MD    No Known Allergies   Physical Exam:  General - No distress ENT - No sinus tenderness, no oral exudate, no LAN Cardiac - s1s2 regular, no murmur Chest - No  wheeze/rales/dullness Back - No focal tenderness Abd - Soft, non-tender Ext - No edema Neuro - Normal strength Skin - No rashes Psych - normal mood, and behavior   Assessment/Plan:  Kirsten Helling, MD [facility]/Sleep Pager:  [phone]

## 2012-09-13 ENCOUNTER — Encounter: Payer: Self-pay | Admitting: Internal Medicine

## 2012-09-29 ENCOUNTER — Telehealth: Payer: Self-pay | Admitting: Pulmonary Disease

## 2012-09-29 MED ORDER — TIOTROPIUM BROMIDE MONOHYDRATE 18 MCG IN CAPS: 18.0000 ug | ORAL_CAPSULE | Freq: Every day | RESPIRATORY_TRACT | Status: DC

## 2012-09-29 NOTE — Telephone Encounter (Signed)
Spoke with the pt She is asking if she is supposed to continue spiriva, and if so needs new rx I advised per last ov AVS, she is to continue taking med I have sent rx to her pharm for 90 day supply per her request and left sample as well with coupon  Nothing further needed per pt

## 2012-10-09 DIAGNOSIS — IMO0002 Reserved for concepts with insufficient information to code with codable children: Secondary | ICD-10-CM | POA: Diagnosis not present

## 2012-10-09 DIAGNOSIS — G250 Essential tremor: Secondary | ICD-10-CM | POA: Diagnosis not present

## 2012-10-09 DIAGNOSIS — J449 Chronic obstructive pulmonary disease, unspecified: Secondary | ICD-10-CM | POA: Diagnosis not present

## 2012-10-09 DIAGNOSIS — E785 Hyperlipidemia, unspecified: Secondary | ICD-10-CM | POA: Diagnosis not present

## 2012-10-09 DIAGNOSIS — R002 Palpitations: Secondary | ICD-10-CM | POA: Diagnosis not present

## 2012-10-09 DIAGNOSIS — I10 Essential (primary) hypertension: Secondary | ICD-10-CM | POA: Diagnosis not present

## 2012-10-11 ENCOUNTER — Encounter: Payer: Self-pay | Admitting: Internal Medicine

## 2012-10-11 ENCOUNTER — Ambulatory Visit (INDEPENDENT_AMBULATORY_CARE_PROVIDER_SITE_OTHER): Payer: Medicare Other | Admitting: Internal Medicine

## 2012-10-11 VITALS — BP 124/60 | HR 60 | Ht 60.0 in | Wt 111.1 lb

## 2012-10-11 DIAGNOSIS — I493 Ventricular premature depolarization: Secondary | ICD-10-CM

## 2012-10-11 DIAGNOSIS — I4949 Other premature depolarization: Secondary | ICD-10-CM | POA: Diagnosis not present

## 2012-10-11 DIAGNOSIS — I10 Essential (primary) hypertension: Secondary | ICD-10-CM | POA: Diagnosis not present

## 2012-10-11 MED ORDER — FLECAINIDE ACETATE 50 MG PO TABS: ORAL_TABLET | ORAL | Status: DC

## 2012-10-11 NOTE — Assessment & Plan Note (Signed)
Her blood pressure today is well controlled. She will continue her current medical therapy. 

## 2012-10-11 NOTE — Assessment & Plan Note (Signed)
Her symptoms are still not well-controlled. We discussed treatment options, and I recommended that the patient increase her dose of flecainide to 50 mg 3 times a day.

## 2012-10-11 NOTE — Patient Instructions (Signed)
Your physician wants you to follow-up in: 6 months with Dr Court Joy will receive a reminder letter in the mail two months in advance. If you don't receive a letter, please call our office to schedule the follow-up appointment.  Your physician has recommended you make the following change in your medication:  1) Increase Flecainide to three times daily as needed

## 2012-10-11 NOTE — Progress Notes (Signed)
HPI [name] returns today for followup. She is a very pleasant 72 year old woman with palpitations and COPD. She is very symptomatic and recently were cardiac monitor which demonstrated PACs which correlated with her symptoms. She has been on 50 mg twice daily of flecainide which initially improved her symptoms. She notes that her flecainide dosing has been stable and she denies noncompliance. She has had no syncope. Her dyspnea has improved with control of her COPD. No Known Allergies   Current Outpatient Prescriptions  Medication Sig Dispense Refill  . albuterol (PROAIR HFA) 108 (90 BASE) MCG/ACT inhaler Inhale 2 puffs into the lungs every 6 (six) hours as needed for wheezing.      Marland Kitchen aspirin 81 MG tablet Take 81 mg by mouth daily.        . beclomethasone (QVAR) 80 MCG/ACT inhaler Inhale 1 puff into the lungs 2 (two) times daily.  1 Inhaler  6  . beclomethasone (QVAR) 80 MCG/ACT inhaler Inhale 1 puff into the lungs 2 (two) times daily.  3 Inhaler  3  . Boswellia-Glucosamine-Vit D (OSTEO BI-FLEX/5-LOXIN ADVANCED) TABS Take 1 tablet by mouth daily.        Marland Kitchen CINNAMON PO Take 1,000 mg by mouth daily.        . cycloSPORINE (RESTASIS) 0.05 % ophthalmic emulsion Place 1 drop into both eyes 2 (two) times daily. Every 2 days       . Diphenhyd-Hydrocort-Nystatin (FIRST-DUKES MOUTHWASH) SUSP       . estrogens, conjugated, (PREMARIN) 0.9 MG tablet Take 0.9 mg by mouth daily. Take daily for 21 days then do not take for 7 days.       . flecainide (TAMBOCOR) 50 MG tablet Take three times daily as directed  270 tablet  3  . hydrochlorothiazide (MICROZIDE) 12.5 MG capsule Take 12.5 mg by mouth daily.        Marland Kitchen levothyroxine (SYNTHROID, LEVOTHROID) 50 MCG tablet Take 50 mcg by mouth daily.        Marland Kitchen tiotropium (SPIRIVA) 18 MCG inhalation capsule Place 1 capsule (18 mcg total) into inhaler and inhale daily.  20 capsule  0  . verapamil (CALAN-SR) 120 MG CR tablet Take 1 tablet (120 mg total) by mouth at bedtime.   30 tablet  2  . Vitamin D, Ergocalciferol, (DRISDOL) 50000 UNITS CAPS Take 50,000 Units by mouth. Twice a month       . zolpidem (AMBIEN) 10 MG tablet Take 10 mg by mouth at bedtime as needed. 1 tablet a week        No current facility-administered medications for this visit.     Past Medical History  Diagnosis Date  . HTN (hypertension)   . H/O: hysterectomy   . IGT (impaired glucose tolerance)   . Osteopenia   . Palpitations   . Heart murmur   . Tinnitus   . Vertigo   . HTN (hypertension)   . Tremor, essential     ROS:   All systems reviewed and negative except as noted in the HPI.   Past Surgical History  Procedure Laterality Date  . Orthopedic procedure       Family History  Problem Relation Age of Onset  . Kidney disease Mother   . Cirrhosis Mother   . Lung cancer Father   . Heart disease Maternal Grandmother      History   Social History  . Marital Status: Divorced    Spouse Name: N/A    Number of Children: 0  .  Years of Education: N/A   Occupational History  . retired Armed forces logistics/support/administrative officer for [employer]    Social History Main Topics  . Smoking status: Never Smoker   . Smokeless tobacco: Never Used  . Alcohol Use: No  . Drug Use: No  . Sexual Activity: Not on file   Other Topics Concern  . Not on file   Social History Narrative  . No narrative on file     BP 124/60  Pulse 60  Ht 5' (1.524 m)  Wt 111 lb 1.9 oz (50.404 kg)  BMI 21.7 kg/m2  SpO2 95%  Physical Exam:  Well appearing 72 year old woman, NAD HEENT: Unremarkable Neck:  6 cm JVD, no thyromegally Back:  No CVA tenderness Lungs:  Clear with no wheezes, rales, or rhonchi. HEART:  Regular rate rhythm, no murmurs, no rubs, no clicks Abd:  soft, positive bowel sounds, no organomegally, no rebound, no guarding Ext:  2 plus pulses, no edema, no cyanosis, no clubbing Skin:  No rashes no nodules Neuro:  CN II through XII intact, motor grossly intact  Assess/Plan:

## 2012-10-17 ENCOUNTER — Other Ambulatory Visit: Payer: Self-pay

## 2012-10-17 DIAGNOSIS — Z1231 Encounter for screening mammogram for malignant neoplasm of breast: Secondary | ICD-10-CM | POA: Diagnosis not present

## 2012-10-17 DIAGNOSIS — I10 Essential (primary) hypertension: Secondary | ICD-10-CM

## 2012-10-17 MED ORDER — VERAPAMIL HCL ER 120 MG PO TBCR: 120.0000 mg | EXTENDED_RELEASE_TABLET | Freq: Every day | ORAL | Status: DC

## 2012-10-19 ENCOUNTER — Telehealth: Payer: Self-pay | Admitting: Internal Medicine

## 2012-10-19 NOTE — Telephone Encounter (Signed)
New problem    Pt Flecainide was increase to 3 x daily and pt is having racing heart. Pt need to know what does she need to do. Please call pt to further advise.

## 2012-10-20 NOTE — Telephone Encounter (Signed)
Discussed with Dr Ladona Ridgel okay to decrease the Flecainide to 50mg  bid and call back with how she fels.  Tried to call the patient and had to leave a message

## 2012-10-25 NOTE — Telephone Encounter (Signed)
Spoke with patient and she is feeling much better on Flecainide twice daily.  She will call us back if needed

## 2012-11-01 ENCOUNTER — Ambulatory Visit (INDEPENDENT_AMBULATORY_CARE_PROVIDER_SITE_OTHER): Payer: Medicare Other | Admitting: Pulmonary Disease

## 2012-11-01 ENCOUNTER — Encounter: Payer: Self-pay | Admitting: Pulmonary Disease

## 2012-11-01 VITALS — BP 102/62 | HR 64 | Ht 59.0 in | Wt 113.0 lb

## 2012-11-01 DIAGNOSIS — J449 Chronic obstructive pulmonary disease, unspecified: Secondary | ICD-10-CM

## 2012-11-01 NOTE — Progress Notes (Signed)
Chief Complaint  Patient presents with  . COPD    Breathing is unchanged. Reports DOE, coughing. Denies chest tightness or wheezing at this time.    History of Present Illness: Kirsten [name] is a 72 y.o. 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  Her breathing has been doing okay.  She is using Qvar one puff bid.  She has not needed to use proair.  She gets occasional cough and wheeze, and her inhalers help.  She sometimes get winded with activity, but quickly recovers at rest.  She is not having fever, sputum, skin rash, or gland swelling.  TESTS: CXR 07/24/12 >> no acute process PFT 07/26/12 >> FEV1 1.38 (81%), post-BD FEV1% 79, TLC 5.72 (113%), RV 3.01 (158%), DLCO 60%, +BD response  Kambre A Quinonez  has a past medical history of HTN (hypertension); H/O: hysterectomy; IGT (impaired glucose tolerance); Osteopenia; Palpitations; Heart murmur; Tinnitus; Vertigo; HTN (hypertension); and Tremor, essential.  Samanthia A Hackleman  has past surgical history that includes orthopedic procedure.  Prior to Admission medications   Medication Sig Start Date End Date Taking? Authorizing Provider  albuterol (PROAIR HFA) 108 (90 BASE) MCG/ACT inhaler Inhale 2 puffs into the lungs every 6 (six) hours as needed for wheezing.   Yes Historical Provider, MD  aspirin 81 MG tablet Take 81 mg by mouth daily.     Yes Historical Provider, MD  Boswellia-Glucosamine-Vit D (OSTEO BI-FLEX/5-LOXIN ADVANCED) TABS Take 1 tablet by mouth daily.     Yes Historical Provider, MD  CINNAMON PO Take 1,000 mg by mouth daily.     Yes Historical Provider, MD  cycloSPORINE (RESTASIS) 0.05 % ophthalmic emulsion Place 1 drop into both eyes 2 (two) times daily. Every 2 days    Yes Historical Provider, MD  estrogens, conjugated, (PREMARIN) 0.9 MG tablet Take 0.9 mg by mouth daily. Take daily for 21 days then do not take for 7 days.    Yes Historical Provider, MD  flecainide (TAMBOCOR) 50 MG tablet TAKE 1 TABLET TWICE A DAY 02/05/12  Yes Marinus Maw, MD   hydrochlorothiazide (MICROZIDE) 12.5 MG capsule Take 12.5 mg by mouth daily.     Yes Historical Provider, MD  levothyroxine (SYNTHROID, LEVOTHROID) 50 MCG tablet Take 50 mcg by mouth daily.     Yes Historical Provider, MD  mometasone-formoterol (DULERA) 100-5 MCG/ACT AERO Inhale 2 puffs into the lungs 2 (two) times daily. 08/16/12  Yes [name], MD  tiotropium (SPIRIVA) 18 MCG inhalation capsule Place 18 mcg into inhaler and inhale daily.   Yes Historical Provider, MD  verapamil (CALAN-SR) 120 MG CR tablet Take 1 tablet (120 mg total) by mouth at bedtime. 08/29/12  Yes Marinus Maw, MD  Vitamin D, Ergocalciferol, (DRISDOL) 50000 UNITS CAPS Take 50,000 Units by mouth. Twice a month    Yes Historical Provider, MD  zolpidem (AMBIEN) 10 MG tablet Take 10 mg by mouth at bedtime as needed. 1 tablet a week    Yes Historical Provider, MD    No Known Allergies   Physical Exam:  General - No distress ENT - No sinus tenderness, no oral exudate, no LAN Cardiac - s1s2 regular, no murmur Chest - No wheeze/rales/dullness Back - No focal tenderness Abd - Soft, non-tender Ext - No edema Neuro - Normal strength Skin - No rashes Psych - normal mood, and behavior   Assessment/Plan:  [name], MD [facility]/Sleep Pager:  [phone]

## 2012-11-01 NOTE — Patient Instructions (Signed)
Flu shot today Can try decreasing frequency of using Qvar Follow up in 6 months

## 2012-11-01 NOTE — Assessment & Plan Note (Signed)
Stable on current regimen.  Advised she could try decreasing frequency of Qvar.    She got flu shot today.

## 2012-11-03 ENCOUNTER — Telehealth: Payer: Self-pay | Admitting: Pulmonary Disease

## 2012-11-03 NOTE — Telephone Encounter (Signed)
Called and spoke with pt and she stated that she accidentally swallowed a capsule of the spiriva.  Pt stated that she is very concerned about taking this medication.  Pt is aware that she will be ok, but wanted me to get the reassurance from one of the Doctors here in the office.  VS is out of the office today.  RA please advise. Thanks  No Known Allergies

## 2012-11-03 NOTE — Telephone Encounter (Signed)
Called and spoke with pt and she was given recs from RA.  Pt is aware that she should be ok and if any problems to seek medical help.  Pt voiced her understanding and nothing further is needed.

## 2012-11-03 NOTE — Telephone Encounter (Signed)
Should be ok

## 2012-11-23 ENCOUNTER — Other Ambulatory Visit: Payer: Self-pay | Admitting: *Deleted

## 2012-11-23 DIAGNOSIS — I10 Essential (primary) hypertension: Secondary | ICD-10-CM

## 2012-11-23 MED ORDER — VERAPAMIL HCL ER 120 MG PO TBCR: 120.0000 mg | EXTENDED_RELEASE_TABLET | Freq: Every day | ORAL | Status: DC

## 2013-02-01 DIAGNOSIS — I10 Essential (primary) hypertension: Secondary | ICD-10-CM | POA: Diagnosis not present

## 2013-02-01 DIAGNOSIS — M899 Disorder of bone, unspecified: Secondary | ICD-10-CM | POA: Diagnosis not present

## 2013-02-01 DIAGNOSIS — R7309 Other abnormal glucose: Secondary | ICD-10-CM | POA: Diagnosis not present

## 2013-02-01 DIAGNOSIS — Z23 Encounter for immunization: Secondary | ICD-10-CM | POA: Diagnosis not present

## 2013-02-01 DIAGNOSIS — E039 Hypothyroidism, unspecified: Secondary | ICD-10-CM | POA: Diagnosis not present

## 2013-02-01 DIAGNOSIS — E785 Hyperlipidemia, unspecified: Secondary | ICD-10-CM | POA: Diagnosis not present

## 2013-02-01 DIAGNOSIS — IMO0002 Reserved for concepts with insufficient information to code with codable children: Secondary | ICD-10-CM | POA: Diagnosis not present

## 2013-02-01 DIAGNOSIS — J449 Chronic obstructive pulmonary disease, unspecified: Secondary | ICD-10-CM | POA: Diagnosis not present

## 2013-02-08 DIAGNOSIS — H2589 Other age-related cataract: Secondary | ICD-10-CM | POA: Diagnosis not present

## 2013-02-27 ENCOUNTER — Other Ambulatory Visit: Payer: Self-pay | Admitting: *Deleted

## 2013-02-27 MED ORDER — BECLOMETHASONE DIPROPIONATE 80 MCG/ACT IN AERS: 1.0000 | INHALATION_SPRAY | Freq: Two times a day (BID) | RESPIRATORY_TRACT | Status: DC

## 2013-03-06 DIAGNOSIS — IMO0002 Reserved for concepts with insufficient information to code with codable children: Secondary | ICD-10-CM | POA: Diagnosis not present

## 2013-03-06 DIAGNOSIS — J449 Chronic obstructive pulmonary disease, unspecified: Secondary | ICD-10-CM | POA: Diagnosis not present

## 2013-03-06 DIAGNOSIS — E739 Lactose intolerance, unspecified: Secondary | ICD-10-CM | POA: Diagnosis not present

## 2013-03-06 DIAGNOSIS — I10 Essential (primary) hypertension: Secondary | ICD-10-CM | POA: Diagnosis not present

## 2013-03-06 DIAGNOSIS — I498 Other specified cardiac arrhythmias: Secondary | ICD-10-CM | POA: Diagnosis not present

## 2013-03-06 DIAGNOSIS — Z01818 Encounter for other preprocedural examination: Secondary | ICD-10-CM | POA: Diagnosis not present

## 2013-03-06 DIAGNOSIS — E785 Hyperlipidemia, unspecified: Secondary | ICD-10-CM | POA: Diagnosis not present

## 2013-03-21 DIAGNOSIS — H2589 Other age-related cataract: Secondary | ICD-10-CM | POA: Diagnosis not present

## 2013-03-26 DIAGNOSIS — H2510 Age-related nuclear cataract, unspecified eye: Secondary | ICD-10-CM | POA: Diagnosis not present

## 2013-03-26 DIAGNOSIS — J449 Chronic obstructive pulmonary disease, unspecified: Secondary | ICD-10-CM | POA: Diagnosis not present

## 2013-03-26 DIAGNOSIS — I10 Essential (primary) hypertension: Secondary | ICD-10-CM | POA: Diagnosis not present

## 2013-03-26 DIAGNOSIS — H259 Unspecified age-related cataract: Secondary | ICD-10-CM | POA: Diagnosis not present

## 2013-03-26 DIAGNOSIS — H269 Unspecified cataract: Secondary | ICD-10-CM | POA: Diagnosis not present

## 2013-04-04 DIAGNOSIS — H259 Unspecified age-related cataract: Secondary | ICD-10-CM | POA: Diagnosis not present

## 2013-04-04 DIAGNOSIS — I10 Essential (primary) hypertension: Secondary | ICD-10-CM | POA: Diagnosis not present

## 2013-04-04 DIAGNOSIS — I498 Other specified cardiac arrhythmias: Secondary | ICD-10-CM | POA: Diagnosis not present

## 2013-04-04 DIAGNOSIS — H269 Unspecified cataract: Secondary | ICD-10-CM | POA: Diagnosis not present

## 2013-04-04 DIAGNOSIS — J449 Chronic obstructive pulmonary disease, unspecified: Secondary | ICD-10-CM | POA: Diagnosis not present

## 2013-04-04 DIAGNOSIS — H2510 Age-related nuclear cataract, unspecified eye: Secondary | ICD-10-CM | POA: Diagnosis not present

## 2013-04-10 ENCOUNTER — Ambulatory Visit: Payer: Medicare Other | Admitting: Internal Medicine

## 2013-04-20 ENCOUNTER — Other Ambulatory Visit: Payer: Self-pay | Admitting: Pulmonary Disease

## 2013-04-20 ENCOUNTER — Telehealth: Payer: Self-pay | Admitting: Pulmonary Disease

## 2013-04-20 MED ORDER — TIOTROPIUM BROMIDE MONOHYDRATE 18 MCG IN CAPS: 18.0000 ug | ORAL_CAPSULE | Freq: Every day | RESPIRATORY_TRACT | Status: DC

## 2013-04-20 NOTE — Telephone Encounter (Signed)
PT aware rx has been sent to [facility]. Nothing further needed

## 2013-05-04 ENCOUNTER — Ambulatory Visit: Payer: Medicare Other | Admitting: Pulmonary Disease

## 2013-05-11 ENCOUNTER — Other Ambulatory Visit: Payer: Self-pay | Admitting: Internal Medicine

## 2013-05-24 ENCOUNTER — Encounter: Payer: Self-pay | Admitting: Internal Medicine

## 2013-05-24 ENCOUNTER — Ambulatory Visit (INDEPENDENT_AMBULATORY_CARE_PROVIDER_SITE_OTHER): Payer: Medicare Other | Admitting: Internal Medicine

## 2013-05-24 VITALS — BP 134/64 | HR 59 | Ht 60.0 in | Wt 109.0 lb

## 2013-05-24 DIAGNOSIS — I493 Ventricular premature depolarization: Secondary | ICD-10-CM

## 2013-05-24 DIAGNOSIS — I10 Essential (primary) hypertension: Secondary | ICD-10-CM

## 2013-05-24 DIAGNOSIS — I4949 Other premature depolarization: Secondary | ICD-10-CM

## 2013-05-24 DIAGNOSIS — R002 Palpitations: Secondary | ICD-10-CM

## 2013-05-24 MED ORDER — VERAPAMIL HCL ER 120 MG PO TBCR: EXTENDED_RELEASE_TABLET | ORAL | Status: DC

## 2013-05-24 MED ORDER — FLECAINIDE ACETATE 50 MG PO TABS: ORAL_TABLET | ORAL | Status: DC

## 2013-05-24 NOTE — Assessment & Plan Note (Signed)
Her symptoms are well controlled. She will continue her current meds. Including flecainide.

## 2013-05-24 NOTE — Progress Notes (Signed)
HPI Kirsten [name] returns today for followup. She is a very pleasant 73 year old woman with palpitations and COPD. She is very symptomatic and recently were cardiac monitor which demonstrated PACs which correlated with her symptoms. She has been on 50 mg twice daily of flecainide which initially improved her symptoms. She notes that her flecainide dosing has been stable and she denies noncompliance. She has had no syncope. Her dyspnea has improved with control of her COPD. No Known Allergies   Current Outpatient Prescriptions  Medication Sig Dispense Refill  . albuterol (PROAIR HFA) 108 (90 BASE) MCG/ACT inhaler Inhale 2 puffs into the lungs every 6 (six) hours as needed for wheezing.      Marland Kitchen aspirin 81 MG tablet Take 81 mg by mouth daily.        . beclomethasone (QVAR) 80 MCG/ACT inhaler Inhale 1 puff into the lungs 2 (two) times daily.  3 Inhaler  3  . Biotin 5000 MCG TABS Take by mouth. 10,000 mcg      . Boswellia-Glucosamine-Vit D (OSTEO BI-FLEX/5-LOXIN ADVANCED) TABS Take 1 tablet by mouth daily.        Marland Kitchen CINNAMON PO Take 1,000 mg by mouth daily.        . Diphenhyd-Hydrocort-Nystatin (FIRST-DUKES MOUTHWASH) SUSP       . estrogens, conjugated, (PREMARIN) 0.9 MG tablet Take 0.9 mg by mouth daily. Take daily for 21 days then do not take for 7 days.       . flecainide (TAMBOCOR) 50 MG tablet Take 100 mg by mouth daily.      . hydrochlorothiazide (MICROZIDE) 12.5 MG capsule Take 12.5 mg by mouth daily.        Marland Kitchen levothyroxine (SYNTHROID, LEVOTHROID) 50 MCG tablet Take 50 mcg by mouth daily. 0.05 mg      . Misc Natural Products (OSTEO BI-FLEX JOINT SHIELD PO) Take by mouth.      . SPIRIVA HANDIHALER 18 MCG inhalation capsule PLACE 1 CAPSULE INTO INHALER AND INHALE DAILY.  90 capsule  1  . tiotropium (SPIRIVA) 18 MCG inhalation capsule Place 1 capsule (18 mcg total) into inhaler and inhale daily.  90 capsule  0  . verapamil (CALAN-SR) 120 MG CR tablet TAKE 1 TABLET (120 MG TOTAL) BY MOUTH AT BEDTIME.   90 tablet  0  . Vitamin D, Ergocalciferol, (DRISDOL) 50000 UNITS CAPS Take 50,000 Units by mouth. Twice a month       . zolpidem (AMBIEN) 10 MG tablet Take 10 mg by mouth at bedtime as needed. 1 tablet a week        No current facility-administered medications for this visit.     Past Medical History  Diagnosis Date  . HTN (hypertension)   . H/O: hysterectomy   . IGT (impaired glucose tolerance)   . Osteopenia   . Palpitations   . Heart murmur   . Tinnitus   . Vertigo   . HTN (hypertension)   . Tremor, essential     ROS:   All systems reviewed and negative except as noted in the HPI.   Past Surgical History  Procedure Laterality Date  . Orthopedic procedure       Family History  Problem Relation Age of Onset  . Kidney disease Mother   . Cirrhosis Mother   . Lung cancer Father   . Heart disease Maternal Grandmother      History   Social History  . Marital Status: Divorced    Spouse Name: N/A    Number of  Children: 0  . Years of Education: N/A   Occupational History  . 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 Smoking status: Never Smoker   . Smokeless tobacco: Never Used  . Alcohol Use: No  . Drug Use: No  . Sexual Activity: Not on file   Other Topics Concern  . Not on file   Social History Narrative  . No narrative on file     BP 134/64  Pulse 59  Ht 5' (1.524 m)  Wt 109 lb (49.442 kg)  BMI 21.29 kg/m2  Physical Exam:  Well appearing 73 year old woman, NAD HEENT: Unremarkable Neck:  6 cm JVD, no thyromegally Back:  No CVA tenderness Lungs:  Clear with no wheezes, rales, or rhonchi. HEART:  Regular rate rhythm, no murmurs, no rubs, no clicks Abd:  soft, positive bowel sounds, no organomegally, no rebound, no guarding Ext:  2 plus pulses, no edema, no cyanosis, no clubbing Skin:  No rashes no nodules Neuro:  CN II through XII intact, motor grossly intact  Assess/Plan:

## 2013-05-24 NOTE — Assessment & Plan Note (Signed)
Her blood pressure is well controlled. Continue current meds and maintain a low sodium diet.

## 2013-05-24 NOTE — Patient Instructions (Signed)
Your physician wants you to follow-up in: 12 months with Dr. Taylor. You will receive a reminder letter in the mail two months in advance. If you don't receive a letter, please call our office to schedule the follow-up appointment.    

## 2013-05-31 ENCOUNTER — Encounter: Payer: Self-pay | Admitting: Pulmonary Disease

## 2013-05-31 ENCOUNTER — Ambulatory Visit (INDEPENDENT_AMBULATORY_CARE_PROVIDER_SITE_OTHER): Payer: Medicare Other | Admitting: Pulmonary Disease

## 2013-05-31 VITALS — BP 114/66 | HR 62 | Ht 59.75 in | Wt 111.0 lb

## 2013-05-31 DIAGNOSIS — J4489 Other specified chronic obstructive pulmonary disease: Secondary | ICD-10-CM

## 2013-05-31 DIAGNOSIS — J449 Chronic obstructive pulmonary disease, unspecified: Secondary | ICD-10-CM

## 2013-05-31 NOTE — Patient Instructions (Signed)
Continue spiriva one puff daily Try using Qvar twice per day  Follow up in 6 months

## 2013-05-31 NOTE — Progress Notes (Signed)
Chief Complaint  Patient presents with  . Follow-up    6 month rov.  Pt c/o occasional SOB with exertion, prod cough with yellow/gray mucous.  Denies any wheezing, chest tightness.  CAT score 18    History of Present Illness: Kirsten [name] is a 73 y.o. 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  She still has cough on daily basis, and brings up yellow to green sputum.  She gets wheezing episodes at night.  She denies fever, hemoptysis, sinus congestion, post-nasal drip, or GERD.  She had an asthma attack in October 2014 >> treated herself with albuterol; none since.    She does okay with activity on level ground, but gets winded when going up stairs and hills.  She is only using Qvar one puff in the morning.  She uses spiriva every day.  She feels albuterol helps when she uses it.  TESTS: CXR 07/24/12 >> no acute process PFT 07/26/12 >> FEV1 1.38 (81%), post-BD FEV1% 79, TLC 5.72 (113%), RV 3.01 (158%), DLCO 60%, +BD response  Kirsten [name]  has a past medical history of HTN (hypertension); H/O: hysterectomy; IGT (impaired glucose tolerance); Osteopenia; Palpitations; Heart murmur; Tinnitus; Vertigo; HTN (hypertension); and Tremor, essential.  Kirsten [name]  has past surgical history that includes orthopedic procedure.  Prior to Admission medications   Medication Sig Start Date End Date Taking? Authorizing Provider  albuterol (PROAIR HFA) 108 (90 BASE) MCG/ACT inhaler Inhale 2 puffs into the lungs every 6 (six) hours as needed for wheezing.   Yes Historical Provider, MD  aspirin 81 MG tablet Take 81 mg by mouth daily.     Yes Historical Provider, MD  Boswellia-Glucosamine-Vit D (OSTEO BI-FLEX/5-LOXIN ADVANCED) TABS Take 1 tablet by mouth daily.     Yes Historical Provider, MD  CINNAMON PO Take 1,000 mg by mouth daily.     Yes Historical Provider, MD  cycloSPORINE (RESTASIS) 0.05 % ophthalmic emulsion Place 1 drop into both eyes 2 (two) times daily. Every 2 days    Yes Historical Provider, MD  estrogens,  conjugated, (PREMARIN) 0.9 MG tablet Take 0.9 mg by mouth daily. Take daily for 21 days then do not take for 7 days.    Yes Historical Provider, MD  flecainide (TAMBOCOR) 50 MG tablet TAKE 1 TABLET TWICE A DAY 02/05/12  Yes Evans Lance, MD  hydrochlorothiazide (MICROZIDE) 12.5 MG capsule Take 12.5 mg by mouth daily.     Yes Historical Provider, MD  levothyroxine (SYNTHROID, LEVOTHROID) 50 MCG tablet Take 50 mcg by mouth daily.     Yes Historical Provider, MD  mometasone-formoterol (DULERA) 100-5 MCG/ACT AERO Inhale 2 puffs into the lungs 2 (two) times daily. 08/16/12  Yes [name], MD  tiotropium (SPIRIVA) 18 MCG inhalation capsule Place 18 mcg into inhaler and inhale daily.   Yes Historical Provider, MD  verapamil (CALAN-SR) 120 MG CR tablet Take 1 tablet (120 mg total) by mouth at bedtime. 08/29/12  Yes Evans Lance, MD  Vitamin D, Ergocalciferol, (DRISDOL) 50000 UNITS CAPS Take 50,000 Units by mouth. Twice a month    Yes Historical Provider, MD  zolpidem (AMBIEN) 10 MG tablet Take 10 mg by mouth at bedtime as needed. 1 tablet a week    Yes Historical Provider, MD    No Known Allergies   Physical Exam:  General - No distress ENT - No sinus tenderness, no oral exudate, no LAN Cardiac - s1s2 regular, no murmur Chest - No wheeze/rales/dullness Back - No focal tenderness Abd - Soft,  non-tender Ext - No edema Neuro - Normal strength Skin - No rashes Psych - normal mood, and behavior   Assessment/Plan:  [name], MD [facility]Critical [facility]/Sleep Pager:  [phone]

## 2013-06-01 NOTE — Assessment & Plan Note (Signed)
Advised her to increase use of Qvar to bid.  She is to continue spiriva daily for now >> she is concerned about expense of her medications >> advised she could try doing spiriva every other day after her symptoms get better with increased use of Qvar.  She is to continue prn proair.

## 2013-06-27 DIAGNOSIS — R21 Rash and other nonspecific skin eruption: Secondary | ICD-10-CM | POA: Diagnosis not present

## 2013-07-17 DIAGNOSIS — M899 Disorder of bone, unspecified: Secondary | ICD-10-CM | POA: Diagnosis not present

## 2013-07-17 DIAGNOSIS — E559 Vitamin D deficiency, unspecified: Secondary | ICD-10-CM | POA: Diagnosis not present

## 2013-07-17 DIAGNOSIS — R7301 Impaired fasting glucose: Secondary | ICD-10-CM | POA: Diagnosis not present

## 2013-07-17 DIAGNOSIS — E039 Hypothyroidism, unspecified: Secondary | ICD-10-CM | POA: Diagnosis not present

## 2013-07-17 DIAGNOSIS — M949 Disorder of cartilage, unspecified: Secondary | ICD-10-CM | POA: Diagnosis not present

## 2013-07-17 DIAGNOSIS — I10 Essential (primary) hypertension: Secondary | ICD-10-CM | POA: Diagnosis not present

## 2013-07-17 DIAGNOSIS — E785 Hyperlipidemia, unspecified: Secondary | ICD-10-CM | POA: Diagnosis not present

## 2013-07-24 DIAGNOSIS — E039 Hypothyroidism, unspecified: Secondary | ICD-10-CM | POA: Diagnosis not present

## 2013-07-24 DIAGNOSIS — I10 Essential (primary) hypertension: Secondary | ICD-10-CM | POA: Diagnosis not present

## 2013-07-24 DIAGNOSIS — J449 Chronic obstructive pulmonary disease, unspecified: Secondary | ICD-10-CM | POA: Diagnosis not present

## 2013-07-24 DIAGNOSIS — M949 Disorder of cartilage, unspecified: Secondary | ICD-10-CM | POA: Diagnosis not present

## 2013-07-24 DIAGNOSIS — M899 Disorder of bone, unspecified: Secondary | ICD-10-CM | POA: Diagnosis not present

## 2013-07-24 DIAGNOSIS — I498 Other specified cardiac arrhythmias: Secondary | ICD-10-CM | POA: Diagnosis not present

## 2013-07-24 DIAGNOSIS — E785 Hyperlipidemia, unspecified: Secondary | ICD-10-CM | POA: Diagnosis not present

## 2013-07-24 DIAGNOSIS — E739 Lactose intolerance, unspecified: Secondary | ICD-10-CM | POA: Diagnosis not present

## 2013-07-24 DIAGNOSIS — Z1212 Encounter for screening for malignant neoplasm of rectum: Secondary | ICD-10-CM | POA: Diagnosis not present

## 2013-07-24 DIAGNOSIS — Z0000 Encounter for general adult medical examination without abnormal findings: Secondary | ICD-10-CM | POA: Diagnosis not present

## 2013-08-21 DIAGNOSIS — Z01419 Encounter for gynecological examination (general) (routine) without abnormal findings: Secondary | ICD-10-CM | POA: Diagnosis not present

## 2013-08-29 ENCOUNTER — Encounter: Payer: Self-pay | Admitting: Gastroenterology

## 2013-08-31 DIAGNOSIS — L219 Seborrheic dermatitis, unspecified: Secondary | ICD-10-CM | POA: Diagnosis not present

## 2013-10-19 ENCOUNTER — Encounter: Payer: Self-pay | Admitting: Gastroenterology

## 2013-11-23 ENCOUNTER — Encounter: Payer: Medicare Other | Admitting: Gastroenterology

## 2013-12-06 DIAGNOSIS — H43812 Vitreous degeneration, left eye: Secondary | ICD-10-CM | POA: Diagnosis not present

## 2013-12-06 DIAGNOSIS — H35349 Macular cyst, hole, or pseudohole, unspecified eye: Secondary | ICD-10-CM | POA: Diagnosis not present

## 2013-12-20 ENCOUNTER — Encounter: Payer: Self-pay | Admitting: Pulmonary Disease

## 2013-12-20 ENCOUNTER — Ambulatory Visit (INDEPENDENT_AMBULATORY_CARE_PROVIDER_SITE_OTHER): Payer: Medicare Other | Admitting: Pulmonary Disease

## 2013-12-20 VITALS — BP 128/62 | HR 60 | Temp 97.8°F | Ht 60.0 in | Wt 117.0 lb

## 2013-12-20 DIAGNOSIS — Z23 Encounter for immunization: Secondary | ICD-10-CM | POA: Diagnosis not present

## 2013-12-20 DIAGNOSIS — L658 Other specified nonscarring hair loss: Secondary | ICD-10-CM

## 2013-12-20 DIAGNOSIS — J449 Chronic obstructive pulmonary disease, unspecified: Secondary | ICD-10-CM

## 2013-12-20 DIAGNOSIS — L659 Nonscarring hair loss, unspecified: Secondary | ICD-10-CM

## 2013-12-20 NOTE — Patient Instructions (Signed)
Flu shot today Follow up in 6 months 

## 2013-12-20 NOTE — Assessment & Plan Note (Signed)
Advised her to d/w her PCP. 

## 2013-12-20 NOTE — Assessment & Plan Note (Signed)
Stable on Qvar and spiriva.

## 2013-12-20 NOTE — Progress Notes (Signed)
Chief Complaint  Patient presents with  . Follow-up    No change since last OV. Pt notes that with exertional activites on incline cause increased SOB.    History of Present Illness: TANDRA ROSADO is a 73 y.o. 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  Her breathing has been okay.  She gets occasional cough and wheeze.  She is not having chest pain, sputum, or sinus congestion.  She gets winded walking up hills/stairs, but no problem on level ground.  She has notice balding in the back of her head >> this happened suddenly.  TESTS: CXR 07/24/12 >> no acute process PFT 07/26/12 >> FEV1 1.38 (81%), post-BD FEV1% 79, TLC 5.72 (113%), RV 3.01 (158%), DLCO 60%, +BD response  PMHx, PSHx, Medications, Allergies, Fhx, Shx reviewed.  Physical Exam:  General - No distress ENT - No sinus tenderness, no oral exudate, no LAN Cardiac - s1s2 regular, no murmur Chest - No wheeze/rales/dullness Back - No focal tenderness Abd - Soft, non-tender Ext - No edema Neuro - Normal strength Skin - No rashes Psych - normal mood, and behavior   Assessment/Plan:  [name], MD [facility]Critical [facility]/Sleep Pager:  [phone]

## 2013-12-27 ENCOUNTER — Ambulatory Visit (AMBULATORY_SURGERY_CENTER): Payer: Self-pay | Admitting: *Deleted

## 2013-12-27 ENCOUNTER — Encounter: Payer: Self-pay | Admitting: *Deleted

## 2013-12-27 VITALS — Ht 58.5 in | Wt 113.4 lb

## 2013-12-27 DIAGNOSIS — Z1211 Encounter for screening for malignant neoplasm of colon: Secondary | ICD-10-CM

## 2013-12-27 MED ORDER — SOD PHOS MONO-SOD PHOS DIBASIC 1.102-0.398 G PO TABS: ORAL_TABLET | ORAL | Status: DC

## 2013-12-27 NOTE — Progress Notes (Signed)
Patient ID: Joselyn Arrow, female   DOB: 16-Dec-1940, 73 y.o.   MRN: [id number]  Received: Today    Milus Banister, MD  Laverna Peace, RN            It's ok, as long as she signed the waiver. Thanks       Previous Messages     ----- Message -----   From: Laverna Peace, RN   Sent: 12/27/2013 10:23 AM    To: Milus Banister, MD   Dr. Ardis Hughs,   Just an FYI-this pt had her PV today and her procedure is on 01-09-14. She had an OV with you in 2013. She states that she discussed with you using Osmoprep instead of Moviprep. She signed the Osmoprep consent form. We haven't used it in so long; I just wanted to make sure you were ok with that. I will call her with Moviprep instructions if you want, but she was insistent that she "cannot drink all of the prep."   Thanks,  Cyril Mourning

## 2013-12-27 NOTE — Progress Notes (Signed)
No egg or soy allergy  Pt has COPD but doesn't wear home oxygen; instructed to bring in her inhalers the day of procedure  No diet medications taken  No trouble with anesthesia or intubation  Pt states she is wanting to use Osmoprep- states she informed Dr. Ardis Hughs of wanting the pills with her 2013 visit.  Osmoprep consent form filled out and instructions given.  Dr. Ardis Hughs made aware

## 2013-12-31 ENCOUNTER — Encounter: Payer: Self-pay | Admitting: Gastroenterology

## 2014-01-09 ENCOUNTER — Encounter: Payer: Self-pay | Admitting: Gastroenterology

## 2014-01-09 ENCOUNTER — Ambulatory Visit (AMBULATORY_SURGERY_CENTER): Payer: Medicare Other | Admitting: Gastroenterology

## 2014-01-09 VITALS — BP 124/44 | HR 69 | Temp 95.8°F | Resp 51 | Ht 58.5 in | Wt 113.0 lb

## 2014-01-09 DIAGNOSIS — R002 Palpitations: Secondary | ICD-10-CM | POA: Diagnosis not present

## 2014-01-09 DIAGNOSIS — K5730 Diverticulosis of large intestine without perforation or abscess without bleeding: Secondary | ICD-10-CM

## 2014-01-09 DIAGNOSIS — Z1211 Encounter for screening for malignant neoplasm of colon: Secondary | ICD-10-CM | POA: Diagnosis not present

## 2014-01-09 DIAGNOSIS — J449 Chronic obstructive pulmonary disease, unspecified: Secondary | ICD-10-CM | POA: Diagnosis not present

## 2014-01-09 DIAGNOSIS — I10 Essential (primary) hypertension: Secondary | ICD-10-CM | POA: Diagnosis not present

## 2014-01-09 DIAGNOSIS — R42 Dizziness and giddiness: Secondary | ICD-10-CM | POA: Diagnosis not present

## 2014-01-09 MED ORDER — SODIUM CHLORIDE 0.9 % IV SOLN: 500.0000 mL | INTRAVENOUS | Status: DC

## 2014-01-09 NOTE — Patient Instructions (Signed)
Discharge instructions given to patient. Handout on diverticulosis and a high fiber diet. Resume previous medications. YOU HAD AN ENDOSCOPIC PROCEDURE TODAY AT [facility]: Refer to the procedure report that was given to you for any specific questions about what was found during the examination.  If the procedure report does not answer your questions, please call your gastroenterologist to clarify.  If you requested that your care partner not be given the details of your procedure findings, then the procedure report has been included in a sealed envelope for you to review at your convenience later.  YOU SHOULD EXPECT: Some feelings of bloating in the abdomen. Passage of more gas than usual.  Walking can help get rid of the air that was put into your GI tract during the procedure and reduce the bloating. If you had a lower endoscopy (such as a colonoscopy or flexible sigmoidoscopy) you may notice spotting of blood in your stool or on the toilet paper. If you underwent a bowel prep for your procedure, then you may not have a normal bowel movement for a few days.  DIET: Your first meal following the procedure should be a light meal and then it is ok to progress to your normal diet.  A half-sandwich or bowl of soup is an example of a good first meal.  Heavy or fried foods are harder to digest and may make you feel nauseous or bloated.  Likewise meals heavy in dairy and vegetables can cause extra gas to form and this can also increase the bloating.  Drink plenty of fluids but you should avoid alcoholic beverages for 24 hours.  ACTIVITY: Your care partner should take you home directly after the procedure.  You should plan to take it easy, moving slowly for the rest of the day.  You can resume normal activity the day after the procedure however you should NOT DRIVE or use heavy machinery for 24 hours (because of the sedation medicines used during the test).    SYMPTOMS TO REPORT IMMEDIATELY: A  gastroenterologist can be reached at any hour.  During normal business hours, 8:30 AM to 5:00 PM Monday through Friday, call [phone].  After hours and on weekends, please call the GI answering service at [phone] who will take a message and have the physician on call contact you.   Following lower endoscopy (colonoscopy or flexible sigmoidoscopy):  Excessive amounts of blood in the stool  Significant tenderness or worsening of abdominal pains  Swelling of the abdomen that is new, acute  Fever of 100F or higher FOLLOW UP: If any biopsies were taken you will be contacted by phone or by letter within the next 1-3 weeks.  Call your gastroenterologist if you have not heard about the biopsies in 3 weeks.  Our staff will call the home number listed on your records the next business day following your procedure to check on you and address any questions or concerns that you may have at that time regarding the information given to you following your procedure. This is a courtesy call and so if there is no answer at the home number and we have not heard from you through the emergency physician on call, we will assume that you have returned to your regular daily activities without incident.  SIGNATURES/CONFIDENTIALITY: You and/or your care partner have signed paperwork which will be entered into your electronic medical record.  These signatures attest to the fact that that the information above on your After Visit  Summary has been reviewed and is understood.  Full responsibility of the confidentiality of this discharge information lies with you and/or your care-partner. 

## 2014-01-09 NOTE — Progress Notes (Signed)
Report to PACU, RN, vss, BBS= Clear.  

## 2014-01-09 NOTE — Op Note (Signed)
[place]  [address]. [place], [id number]   COLONOSCOPY PROCEDURE REPORT  PATIENT: Kirsten [name], Kirsten [name]  MR#: [id number] BIRTHDATE: 09-21-1940 , 89  yrs. old GENDER: female ENDOSCOPIST: Milus Banister, MD REFERRED BY:W.  Lutricia Feil, M.D. PROCEDURE DATE:  01/09/2014 PROCEDURE:   Colonoscopy, screening First Screening Colonoscopy - Avg.  risk and is 50 yrs.  old or older - No.  Prior Negative Screening - Now for repeat screening. 10 or more years since last screening  History of Adenoma - Now for follow-up colonoscopy & has been > or = to 3 yrs.  N/A  Polyps Removed Today? Yes. ASA CLASS:   Class II INDICATIONS:average risk for colon cancer. MEDICATIONS: Monitored anesthesia care and Propofol 140 mg IV  DESCRIPTION OF PROCEDURE:   After the risks benefits and alternatives of the procedure were thoroughly explained, informed consent was obtained.  The digital rectal exam revealed no abnormalities of the rectum.   The LB [id number]  endoscope was introduced through the anus and advanced to the cecum, which was identified by both the appendix and ileocecal valve. No adverse events experienced.   The quality of the prep was excellent.  The instrument was then slowly withdrawn as the colon was fully examined.   COLON FINDINGS: There was mild diverticulosis noted in the left colon.   The examination was otherwise normal.  Retroflexed views revealed no abnormalities. The time to cecum=5 minutes 27 seconds. Withdrawal time=7 minutes 31 seconds.  The scope was withdrawn and the procedure completed. COMPLICATIONS: There were no immediate complications.  ENDOSCOPIC IMPRESSION: 1.   Mild diverticulosis was noted in the left colon 2.   The examination was otherwise normal; no polyps or cancers  RECOMMENDATIONS: You will not need another colonoscopy for colon cancer screening or polyp surveillance.  These types of tests usually stop around the age 79.  eSigned:   Milus Banister, MD 01/09/2014 11:09 AM

## 2014-01-10 ENCOUNTER — Telehealth: Payer: Self-pay | Admitting: *Deleted

## 2014-01-10 NOTE — Telephone Encounter (Signed)
  Follow up Call-  Call back number 01/09/2014  Post procedure Call Back phone  # [phone] hm  Permission to leave phone message Yes     Patient questions:  Do you have a fever, pain , or abdominal swelling? No. Pain Score  0 *  Have you tolerated food without any problems? Yes.    Have you been able to return to your normal activities? Yes.    Do you have any questions about your discharge instructions: Diet   No. Medications  No. Follow up visit  No.  Do you have questions or concerns about your Care? No.  Actions: * If pain score is 4 or above: No action needed, pain <4.

## 2014-01-14 DIAGNOSIS — Z1231 Encounter for screening mammogram for malignant neoplasm of breast: Secondary | ICD-10-CM | POA: Diagnosis not present

## 2014-07-11 ENCOUNTER — Ambulatory Visit (INDEPENDENT_AMBULATORY_CARE_PROVIDER_SITE_OTHER): Payer: Medicare Other | Admitting: Pulmonary Disease

## 2014-07-11 ENCOUNTER — Encounter: Payer: Self-pay | Admitting: Pulmonary Disease

## 2014-07-11 VITALS — BP 128/60 | HR 52 | Ht 59.0 in | Wt 114.6 lb

## 2014-07-11 DIAGNOSIS — R002 Palpitations: Secondary | ICD-10-CM

## 2014-07-11 DIAGNOSIS — R0600 Dyspnea, unspecified: Secondary | ICD-10-CM | POA: Diagnosis not present

## 2014-07-11 DIAGNOSIS — J449 Chronic obstructive pulmonary disease, unspecified: Secondary | ICD-10-CM | POA: Diagnosis not present

## 2014-07-11 NOTE — Progress Notes (Signed)
Chief Complaint  Patient presents with  . Follow-up    Pt reports SOB at times, difficulty "catching" her breath. No other complaints.     History of Present Illness: Kirsten [name] is a 74 y.o. female never smoker with chronic obstructive asthma.  She has been getting episodes in which she feels like she can't catch her breath.  She feels like she can't get air into her lungs.  These episodes last about 1 to 2 minutes.  These can happen when she is walking or talking on the phone.  She has not tried using proair during her episodes.  She walks on a treadmill on a regular basis.  She has trouble walking up hills and feels like she gets out of breath >> recovers after about a minute.  She is not having cough, wheeze, sputum, or chest pain.  TESTS: CXR 07/24/12 >> no acute process PFT 07/26/12 >> FEV1 1.38 (81%), post-BD FEV1% 79, TLC 5.72 (113%), RV 3.01 (158%), DLCO 60%, +BD response  PMHx >> HTN, Osteopenia, Vertigo, Hypothyroidism, Palpitations  PSHx, Medications, Allergies, Fhx, Shx reviewed.  Physical Exam: Blood pressure 128/60, pulse 52, height 4\' 11"  (1.499 m), weight 114 lb 9.6 oz (51.982 kg), SpO2 95 %. Body mass index is 23.13 kg/(m^2).  General - No distress ENT - No sinus tenderness, no oral exudate, no LAN Cardiac - s1s2 regular, no murmur Chest - No wheeze/rales/dullness Back - No focal tenderness Abd - Soft, non-tender Ext - No edema Neuro - Normal strength Skin - No rashes Psych - normal mood, and behavior   Assessment/Plan:  Dyspnea. Most likely from deconditioning. Plan: - discussed trying to vary her exercise routine - advised her to try using proair when she has one of her episodes - if her symptoms persist, then consider CXR and PFT  Chronic obstructive asthma. Plan: - continue spiriva, Qvar, and prn proair  Palpitations. I don't think her current symptoms are related to cardiac disease. Plan: - she says she will schedule f/u with  Lovena Le.   [name], MD [facility]/Sleep Pager:  [phone]

## 2014-07-11 NOTE — Patient Instructions (Signed)
Try using proair when you have trouble with your breathing Follow up in 6 months

## 2014-07-19 DIAGNOSIS — I10 Essential (primary) hypertension: Secondary | ICD-10-CM | POA: Diagnosis not present

## 2014-07-19 DIAGNOSIS — Z0000 Encounter for general adult medical examination without abnormal findings: Secondary | ICD-10-CM | POA: Diagnosis not present

## 2014-07-19 DIAGNOSIS — R7302 Impaired glucose tolerance (oral): Secondary | ICD-10-CM | POA: Diagnosis not present

## 2014-07-19 DIAGNOSIS — M859 Disorder of bone density and structure, unspecified: Secondary | ICD-10-CM | POA: Diagnosis not present

## 2014-07-19 DIAGNOSIS — E039 Hypothyroidism, unspecified: Secondary | ICD-10-CM | POA: Diagnosis not present

## 2014-07-19 DIAGNOSIS — E785 Hyperlipidemia, unspecified: Secondary | ICD-10-CM | POA: Diagnosis not present

## 2014-07-29 DIAGNOSIS — I10 Essential (primary) hypertension: Secondary | ICD-10-CM | POA: Diagnosis not present

## 2014-07-29 DIAGNOSIS — J449 Chronic obstructive pulmonary disease, unspecified: Secondary | ICD-10-CM | POA: Diagnosis not present

## 2014-07-29 DIAGNOSIS — Z0000 Encounter for general adult medical examination without abnormal findings: Secondary | ICD-10-CM | POA: Diagnosis not present

## 2014-07-29 DIAGNOSIS — E785 Hyperlipidemia, unspecified: Secondary | ICD-10-CM | POA: Diagnosis not present

## 2014-07-29 DIAGNOSIS — M8580 Other specified disorders of bone density and structure, unspecified site: Secondary | ICD-10-CM | POA: Diagnosis not present

## 2014-07-29 DIAGNOSIS — I499 Cardiac arrhythmia, unspecified: Secondary | ICD-10-CM | POA: Diagnosis not present

## 2014-07-29 DIAGNOSIS — L659 Nonscarring hair loss, unspecified: Secondary | ICD-10-CM | POA: Diagnosis not present

## 2014-07-29 DIAGNOSIS — R7302 Impaired glucose tolerance (oral): Secondary | ICD-10-CM | POA: Diagnosis not present

## 2014-07-29 DIAGNOSIS — E039 Hypothyroidism, unspecified: Secondary | ICD-10-CM | POA: Diagnosis not present

## 2014-07-29 DIAGNOSIS — L639 Alopecia areata, unspecified: Secondary | ICD-10-CM | POA: Insufficient documentation

## 2014-07-29 DIAGNOSIS — Z1389 Encounter for screening for other disorder: Secondary | ICD-10-CM | POA: Diagnosis not present

## 2014-07-29 DIAGNOSIS — Z6822 Body mass index (BMI) 22.0-22.9, adult: Secondary | ICD-10-CM | POA: Diagnosis not present

## 2014-07-29 DIAGNOSIS — R002 Palpitations: Secondary | ICD-10-CM | POA: Diagnosis not present

## 2014-07-30 DIAGNOSIS — Z1212 Encounter for screening for malignant neoplasm of rectum: Secondary | ICD-10-CM | POA: Diagnosis not present

## 2014-09-03 DIAGNOSIS — L659 Nonscarring hair loss, unspecified: Secondary | ICD-10-CM | POA: Diagnosis not present

## 2014-09-03 DIAGNOSIS — L710 Perioral dermatitis: Secondary | ICD-10-CM | POA: Diagnosis not present

## 2014-10-21 DIAGNOSIS — L308 Other specified dermatitis: Secondary | ICD-10-CM | POA: Diagnosis not present

## 2014-10-21 DIAGNOSIS — L659 Nonscarring hair loss, unspecified: Secondary | ICD-10-CM | POA: Diagnosis not present

## 2014-11-08 DIAGNOSIS — D509 Iron deficiency anemia, unspecified: Secondary | ICD-10-CM | POA: Diagnosis not present

## 2014-11-11 ENCOUNTER — Telehealth: Payer: Self-pay | Admitting: Internal Medicine

## 2014-11-11 NOTE — Telephone Encounter (Signed)
Was due to see Dr Lovena Le in April of 2016.  Melissa will call to schedule appointment

## 2014-11-11 NOTE — Telephone Encounter (Signed)
New Message      Pt calling wanting to know when she is suppose to f/u w/ Dr. Lovena Le. Please call back and advise.

## 2015-01-06 DIAGNOSIS — S61216A Laceration without foreign body of right little finger without damage to nail, initial encounter: Secondary | ICD-10-CM | POA: Diagnosis not present

## 2015-01-18 DIAGNOSIS — Z4802 Encounter for removal of sutures: Secondary | ICD-10-CM | POA: Diagnosis not present

## 2015-02-27 ENCOUNTER — Ambulatory Visit: Payer: PRIVATE HEALTH INSURANCE | Admitting: Internal Medicine

## 2015-03-19 ENCOUNTER — Ambulatory Visit: Payer: PRIVATE HEALTH INSURANCE | Admitting: Internal Medicine

## 2015-04-09 ENCOUNTER — Ambulatory Visit: Payer: PRIVATE HEALTH INSURANCE | Admitting: Pulmonary Disease

## 2015-04-10 ENCOUNTER — Ambulatory Visit: Payer: PRIVATE HEALTH INSURANCE | Admitting: Internal Medicine

## 2015-04-22 DIAGNOSIS — L659 Nonscarring hair loss, unspecified: Secondary | ICD-10-CM | POA: Diagnosis not present

## 2015-04-22 DIAGNOSIS — L309 Dermatitis, unspecified: Secondary | ICD-10-CM | POA: Diagnosis not present

## 2015-07-23 DIAGNOSIS — E038 Other specified hypothyroidism: Secondary | ICD-10-CM | POA: Diagnosis not present

## 2015-07-23 DIAGNOSIS — M859 Disorder of bone density and structure, unspecified: Secondary | ICD-10-CM | POA: Diagnosis not present

## 2015-07-23 DIAGNOSIS — E559 Vitamin D deficiency, unspecified: Secondary | ICD-10-CM | POA: Diagnosis not present

## 2015-07-23 DIAGNOSIS — I10 Essential (primary) hypertension: Secondary | ICD-10-CM | POA: Diagnosis not present

## 2015-07-23 DIAGNOSIS — R7302 Impaired glucose tolerance (oral): Secondary | ICD-10-CM | POA: Diagnosis not present

## 2015-07-23 DIAGNOSIS — E784 Other hyperlipidemia: Secondary | ICD-10-CM | POA: Diagnosis not present

## 2015-07-25 ENCOUNTER — Ambulatory Visit (INDEPENDENT_AMBULATORY_CARE_PROVIDER_SITE_OTHER): Payer: Medicare Other | Admitting: Internal Medicine

## 2015-07-25 ENCOUNTER — Encounter: Payer: Self-pay | Admitting: Internal Medicine

## 2015-07-25 VITALS — BP 128/70 | HR 56 | Ht 60.0 in | Wt 123.2 lb

## 2015-07-25 DIAGNOSIS — I493 Ventricular premature depolarization: Secondary | ICD-10-CM | POA: Diagnosis not present

## 2015-07-25 DIAGNOSIS — R0602 Shortness of breath: Secondary | ICD-10-CM | POA: Diagnosis not present

## 2015-07-25 NOTE — Progress Notes (Signed)
HPI Kirsten [name] returns today for followup. She is a very pleasant 75 year old woman with palpitations and COPD. She is very symptomatic and recently were cardiac monitor which demonstrated PACs which correlated with her symptoms. She has been on 50 mg twice daily of flecainide which initially improved her symptoms. She notes that her flecainide dosing has been stable and she denies noncompliance. She has had no syncope. Her dyspnea has improved with control of her COPD. She has been caring for her mother from [place]. She takes her to HD in [place]. No Known Allergies   Current Outpatient Prescriptions  Medication Sig Dispense Refill  . albuterol (PROAIR HFA) 108 (90 BASE) MCG/ACT inhaler Inhale 2 puffs into the lungs every 6 (six) hours as needed for wheezing.    Marland Kitchen aspirin 81 MG tablet Take 81 mg by mouth daily.      . beclomethasone (QVAR) 80 MCG/ACT inhaler Inhale 1 puff into the lungs 2 (two) times daily. 3 Inhaler 3  . Biotin 5000 MCG TABS Take 10,000 mcg by mouth daily. \    . CINNAMON PO Take 1,000 mg by mouth daily.      Marland Kitchen estrogens, conjugated, (PREMARIN) 0.9 MG tablet Take 0.9 mg by mouth as directed. Take daily for 21 days then do not take for 7 days.    . flecainide (TAMBOCOR) 50 MG tablet Take one by mouth twice daily and an additional if needed for heart rate    . hydrochlorothiazide (HYDRODIURIL) 12.5 MG tablet Take 12.5 mg by mouth daily.     . iron polysaccharides (NIFEREX) 150 MG capsule Take 150 mg by mouth daily.    Marland Kitchen levothyroxine (SYNTHROID, LEVOTHROID) 50 MCG tablet Take 50 mcg by mouth daily.     . Magnesium 400 MG CAPS Take 1 tablet by mouth daily.    . Misc Natural Products (OSTEO BI-FLEX JOINT SHIELD PO) Take 1 capsule by mouth daily.     Marland Kitchen SPIRIVA HANDIHALER 18 MCG inhalation capsule PLACE 1 CAPSULE INTO INHALER AND INHALE DAILY. 90 capsule 1  . verapamil (CALAN-SR) 120 MG CR tablet TAKE 1 TABLET (120 MG TOTAL) BY MOUTH AT BEDTIME. 90 tablet 3  . Vitamin D,  Ergocalciferol, (DRISDOL) 50000 UNITS CAPS Take 50,000 Units by mouth. Twice a month     . zolpidem (AMBIEN) 10 MG tablet Take 10 mg by mouth at bedtime as needed for sleep.      No current facility-administered medications for this visit.     Past Medical History  Diagnosis Date  . HTN (hypertension)   . H/O: hysterectomy   . IGT (impaired glucose tolerance)   . Osteopenia   . Palpitations   . Heart murmur   . Tinnitus   . Vertigo   . HTN (hypertension)   . Tremor, essential   . Allergy     occasional seasonal  . Asthma   . COPD (chronic obstructive pulmonary disease) ([facility])   . Emphysema of lung ([facility])   . Thyroid disease     hypothyroid  . Irregular heart rate     take Flecinade    ROS:   All systems reviewed and negative except as noted in the HPI.   Past Surgical History  Procedure Laterality Date  . Orthopedic procedure      both feet  . Cataract extraction      both eyes- February 2015  . Trigger finger release      right hand     Family History  Problem  Relation Age of Onset  . Kidney disease Mother   . Cirrhosis Mother   . Lung cancer Father   . Heart disease Maternal Grandmother   . Colon cancer Neg Hx   . Esophageal cancer Neg Hx   . Rectal cancer Neg Hx   . Stomach cancer Neg Hx   . Pancreatic cancer Neg Hx      Social History   Social History  . Marital Status: Divorced    Spouse Name: N/A  . Number of Children: 0  . Years of Education: N/A   Occupational History  . retired Estate agent for [employer] History Main Topics  . Smoking status: Never Smoker   . Smokeless tobacco: Never Used  . Alcohol Use: No  . Drug Use: No  . Sexual Activity: Not on file   Other Topics Concern  . Not on file   Social History Narrative     BP 128/70 mmHg  Pulse 56  Ht 5' (1.524 m)  Wt 123 lb 3.2 oz (55.883 kg)  BMI 24.06 kg/m2  Physical Exam:  Well appearing 75 year old woman, NAD HEENT: Unremarkable Neck:  6 cm  JVD, no thyromegally Back:  No CVA tenderness Lungs:  Clear with no wheezes, rales, or rhonchi. HEART:  Regular rate rhythm, no murmurs, no rubs, no clicks Abd:  soft, positive bowel sounds, no organomegally, no rebound, no guarding Ext:  2 plus pulses, no edema, no cyanosis, no clubbing Skin:  No rashes no nodules Neuro:  CN II through XII intact, motor grossly intact  ECG - sinus bradycardia  Assess/Plan: 1. Palpitations - her symptoms are well controlled. She will continue her twice daily flecainide. I have asked her to take an additional flecainide if her palpitations worsen. 2. Sob - she c/o worsening sob. Unclear if this is worsening lung function or the development of LV dysfunction. I have asked her to obtain a 2D echo. If EF is good and if no pulmonary HTN, would consider PFT's. 3. COPD - she will continue her bronchodilators.  Mikle Bosworth.D.

## 2015-07-25 NOTE — Patient Instructions (Signed)
Medication Instructions:  Your physician recommends that you continue on your current medications as directed. Please refer to the Current Medication list given to you today.   Labwork: None ordered   Testing/Procedures: Your physician has requested that you have an echocardiogram. Echocardiography is a painless test that uses sound waves to create images of your heart. It provides your doctor with information about the size and shape of your heart and how well your heart's chambers and valves are working. This procedure takes approximately one hour. There are no restrictions for this procedure.---will call with results after MD reviews    Follow-Up: Your physician wants you to follow-up in: 6 months with Dr Knox Saliva will receive a reminder letter in the mail two months in advance. If you don't receive a letter, please call our office to schedule the follow-up appointment.   Any Other Special Instructions Will Be Listed Below (If Applicable).     If you need a refill on your cardiac medications before your next appointment, please call your pharmacy.

## 2015-07-30 DIAGNOSIS — L639 Alopecia areata, unspecified: Secondary | ICD-10-CM | POA: Diagnosis not present

## 2015-07-30 DIAGNOSIS — J449 Chronic obstructive pulmonary disease, unspecified: Secondary | ICD-10-CM | POA: Diagnosis not present

## 2015-07-30 DIAGNOSIS — R7302 Impaired glucose tolerance (oral): Secondary | ICD-10-CM | POA: Diagnosis not present

## 2015-07-30 DIAGNOSIS — G250 Essential tremor: Secondary | ICD-10-CM | POA: Diagnosis not present

## 2015-07-30 DIAGNOSIS — Z1389 Encounter for screening for other disorder: Secondary | ICD-10-CM | POA: Diagnosis not present

## 2015-07-30 DIAGNOSIS — D509 Iron deficiency anemia, unspecified: Secondary | ICD-10-CM | POA: Diagnosis not present

## 2015-07-30 DIAGNOSIS — D508 Other iron deficiency anemias: Secondary | ICD-10-CM | POA: Diagnosis not present

## 2015-07-30 DIAGNOSIS — I499 Cardiac arrhythmia, unspecified: Secondary | ICD-10-CM | POA: Diagnosis not present

## 2015-07-30 DIAGNOSIS — I10 Essential (primary) hypertension: Secondary | ICD-10-CM | POA: Diagnosis not present

## 2015-07-30 DIAGNOSIS — Z6824 Body mass index (BMI) 24.0-24.9, adult: Secondary | ICD-10-CM | POA: Diagnosis not present

## 2015-07-30 DIAGNOSIS — Z0000 Encounter for general adult medical examination without abnormal findings: Secondary | ICD-10-CM | POA: Diagnosis not present

## 2015-07-30 DIAGNOSIS — E038 Other specified hypothyroidism: Secondary | ICD-10-CM | POA: Diagnosis not present

## 2015-07-30 DIAGNOSIS — E784 Other hyperlipidemia: Secondary | ICD-10-CM | POA: Diagnosis not present

## 2015-07-31 DIAGNOSIS — Z1212 Encounter for screening for malignant neoplasm of rectum: Secondary | ICD-10-CM | POA: Diagnosis not present

## 2015-08-04 ENCOUNTER — Ambulatory Visit (INDEPENDENT_AMBULATORY_CARE_PROVIDER_SITE_OTHER): Payer: Medicare Other | Admitting: Pulmonary Disease

## 2015-08-04 ENCOUNTER — Encounter: Payer: Self-pay | Admitting: Pulmonary Disease

## 2015-08-04 ENCOUNTER — Ambulatory Visit (INDEPENDENT_AMBULATORY_CARE_PROVIDER_SITE_OTHER)
Admission: RE | Admit: 2015-08-04 | Discharge: 2015-08-04 | Disposition: A | Discharge status: Home / Self Care | Payer: Medicare Other | Source: Ambulatory Visit | Attending: Pulmonary Disease | Admitting: Pulmonary Disease

## 2015-08-04 VITALS — BP 110/56 | HR 88 | Ht 59.0 in | Wt 125.6 lb

## 2015-08-04 DIAGNOSIS — J449 Chronic obstructive pulmonary disease, unspecified: Secondary | ICD-10-CM

## 2015-08-04 DIAGNOSIS — J45909 Unspecified asthma, uncomplicated: Secondary | ICD-10-CM | POA: Diagnosis not present

## 2015-08-04 DIAGNOSIS — R0600 Dyspnea, unspecified: Secondary | ICD-10-CM | POA: Diagnosis not present

## 2015-08-04 DIAGNOSIS — R0602 Shortness of breath: Secondary | ICD-10-CM | POA: Diagnosis not present

## 2015-08-04 MED ORDER — FLUTICASONE FUROATE-VILANTEROL 100-25 MCG/INH IN AEPB: 1.0000 | INHALATION_SPRAY | Freq: Every day | RESPIRATORY_TRACT | Status: DC

## 2015-08-04 NOTE — Patient Instructions (Signed)
Chest xray today Will schedule pulmonary function test Breo one puff daily >> rinse mouth after each use Stop Qvar  Follow up in 6 weeks with Dr. Halford Chessman or Nurse Practitioner

## 2015-08-04 NOTE — Progress Notes (Signed)
Current Outpatient Prescriptions on File Prior to Visit  Medication Sig  . albuterol (PROAIR HFA) 108 (90 BASE) MCG/ACT inhaler Inhale 2 puffs into the lungs every 6 (six) hours as needed for wheezing.  Marland Kitchen aspirin 81 MG tablet Take 81 mg by mouth daily.    . Biotin 5000 MCG TABS Take 10,000 mcg by mouth daily. \  . CINNAMON PO Take 1,000 mg by mouth daily.    Marland Kitchen estrogens, conjugated, (PREMARIN) 0.9 MG tablet Take 0.9 mg by mouth as directed. Take daily for 21 days then do not take for 7 days.  . flecainide (TAMBOCOR) 50 MG tablet Take one by mouth twice daily and an additional if needed for heart rate  . hydrochlorothiazide (HYDRODIURIL) 12.5 MG tablet Take 12.5 mg by mouth daily.   . iron polysaccharides (NIFEREX) 150 MG capsule Take 150 mg by mouth daily.  Marland Kitchen levothyroxine (SYNTHROID, LEVOTHROID) 50 MCG tablet Take 50 mcg by mouth daily.   . Magnesium 400 MG CAPS Take 1 tablet by mouth daily.  . Misc Natural Products (OSTEO BI-FLEX JOINT SHIELD PO) Take 1 capsule by mouth daily.   Marland Kitchen SPIRIVA HANDIHALER 18 MCG inhalation capsule PLACE 1 CAPSULE INTO INHALER AND INHALE DAILY.  . verapamil (CALAN-SR) 120 MG CR tablet TAKE 1 TABLET (120 MG TOTAL) BY MOUTH AT BEDTIME.  Marland Kitchen Vitamin D, Ergocalciferol, (DRISDOL) 50000 UNITS CAPS Take 50,000 Units by mouth. Twice a month   . zolpidem (AMBIEN) 10 MG tablet Take 10 mg by mouth at bedtime as needed for sleep.    No current facility-administered medications on file prior to visit.    Chief Complaint  Patient presents with  . Follow-up    Pt c/o increased SOB at times. Notes some chest tightness with the SOB. Uses Albuterol with SOB and gets good relief. Using Spiriva daily and Qvar PRN.     Tests CXR 07/24/12 >> no acute process PFT 07/26/12 >> FEV1 1.38 (81%), post-BD FEV1% 79, TLC 5.72 (113%), RV 3.01 (158%), DLCO 60%, +BD response  Past medical history HTN, Osteopenia, Vertigo, Hypothyroidism, Palpitations, Alopecia  Past surgical history, Family  history, Social history, Allergies reviewed.  Vital signs BP 110/56 mmHg  Pulse 88  Ht 4\' 11"  (1.499 m)  Wt 125 lb 9.6 oz (56.972 kg)  BMI 25.35 kg/m2  SpO2 94%   History of Present Illness: Kirsten [name] is a 75 y.o. female never smoker with chronic obstructive asthma.  She continue to have trouble with her breathing.  This happens randomly.  She gets winded after walking about 1/2 block.  She has episodes of cough and wheeze >> these happen more at night.  She feels like her chest gets squeezed and she can't take a deep breath.  She feels proair helps.  Physical Exam:  General - No distress ENT - No sinus tenderness, no oral exudate, no LAN Cardiac - s1s2 regular, no murmur Chest - No wheeze/rales/dullness Back - No focal tenderness Abd - Soft, non-tender Ext - No edema Neuro - Normal strength Skin - No rashes Psych - normal mood, and behavior  Discussion: She has persistent symptoms of dyspnea.  Her symptom description fits with COPD/asthma.  Assessment/Plan:  Dyspnea. - will get chest xray and arrange for PFTs - f/u Echo from cardiology - if symptoms persist, then consider CT chest with contrast and CPST  Chronic obstructive asthma. - will change from Qvar to [facility] >> need to monitor for symptoms of palpitations - continue spiriva, and prn proair -  will need to discuss administering Prevnar at next visit   Patient Instructions  Chest xray today Will schedule pulmonary function test Breo one puff daily >> rinse mouth after each use Stop Qvar  Follow up in 6 weeks with Dr. Halford Chessman or Nurse Practitioner     Kirsten Mires, MD [place] [facility] Pager:  [phone] 08/04/2015, 9:19 AM

## 2015-08-05 ENCOUNTER — Telehealth: Payer: Self-pay | Admitting: Pulmonary Disease

## 2015-08-05 NOTE — Telephone Encounter (Signed)
Dg Chest 2 View  08/04/2015  CLINICAL DATA:  Shortness of breath and wheezing EXAM: CHEST  2 VIEW COMPARISON:  October 29, 2005 FINDINGS: There is no edema or consolidation. The heart size and pulmonary vascularity are normal. No pneumothorax. No adenopathy. No bone lesions. IMPRESSION: No edema or consolidation. Electronically Signed   By: Lowella Grip III M.D.   On: 08/04/2015 09:49    Will have my nurse inform pt that CXR was normal.

## 2015-08-06 NOTE — Telephone Encounter (Signed)
LM for pt x 1  

## 2015-08-06 NOTE — Telephone Encounter (Signed)
Spoke with pt and advised of results. Nothing further needed.  

## 2015-08-06 NOTE — Telephone Encounter (Signed)
Pt returning call about test results please call [phone]

## 2015-08-29 ENCOUNTER — Other Ambulatory Visit: Payer: Self-pay

## 2015-08-29 ENCOUNTER — Ambulatory Visit (HOSPITAL_COMMUNITY): Payer: Medicare Other | Attending: Cardiovascular Disease

## 2015-08-29 DIAGNOSIS — J449 Chronic obstructive pulmonary disease, unspecified: Secondary | ICD-10-CM | POA: Diagnosis not present

## 2015-08-29 DIAGNOSIS — I340 Nonrheumatic mitral (valve) insufficiency: Secondary | ICD-10-CM | POA: Insufficient documentation

## 2015-08-29 DIAGNOSIS — R0602 Shortness of breath: Secondary | ICD-10-CM

## 2015-08-29 DIAGNOSIS — I10 Essential (primary) hypertension: Secondary | ICD-10-CM | POA: Diagnosis not present

## 2015-08-29 DIAGNOSIS — J439 Emphysema, unspecified: Secondary | ICD-10-CM | POA: Insufficient documentation

## 2015-08-29 DIAGNOSIS — R0600 Dyspnea, unspecified: Secondary | ICD-10-CM | POA: Diagnosis present

## 2015-09-09 ENCOUNTER — Telehealth: Payer: Self-pay | Admitting: Internal Medicine

## 2015-09-09 NOTE — Telephone Encounter (Signed)
Patient aware.

## 2015-09-09 NOTE — Telephone Encounter (Signed)
New message ° ° ° ° ° °Calling for echo results °

## 2015-09-18 ENCOUNTER — Encounter (INDEPENDENT_AMBULATORY_CARE_PROVIDER_SITE_OTHER): Payer: Medicare Other | Admitting: Pulmonary Disease

## 2015-09-18 ENCOUNTER — Ambulatory Visit: Payer: PRIVATE HEALTH INSURANCE | Admitting: Acute Care

## 2015-09-18 DIAGNOSIS — R0600 Dyspnea, unspecified: Secondary | ICD-10-CM

## 2015-09-18 DIAGNOSIS — J449 Chronic obstructive pulmonary disease, unspecified: Secondary | ICD-10-CM

## 2015-09-18 LAB — PULMONARY FUNCTION TEST
DL/VA % PRED: 74 %
DL/VA: 2.9 ml/min/mmHg/L
DLCO COR: 10.38 ml/min/mmHg
DLCO UNC % PRED: 63 %
DLCO cor % pred: 64 %
DLCO unc: 10.25 ml/min/mmHg
FEF 25-75 POST: 2 L/s
FEF 25-75 PRE: 1.22 L/s
FEF2575-%CHANGE-POST: 64 %
FEF2575-%PRED-POST: 144 %
FEF2575-%PRED-PRE: 88 %
FEV1-%Change-Post: 9 %
FEV1-%PRED-POST: 85 %
FEV1-%Pred-Pre: 78 %
FEV1-PRE: 1.25 L
FEV1-Post: 1.37 L
FEV1FVC-%CHANGE-POST: 5 %
FEV1FVC-%Pred-Pre: 107 %
FEV6-%CHANGE-POST: 3 %
FEV6-%Pred-Post: 78 %
FEV6-%Pred-Pre: 75 %
FEV6-PRE: 1.55 L
FEV6-Post: 1.61 L
FEV6FVC-%Change-Post: 0 %
FEV6FVC-%PRED-PRE: 106 %
FEV6FVC-%Pred-Post: 106 %
FVC-%Change-Post: 3 %
FVC-%Pred-Post: 74 %
FVC-%Pred-Pre: 71 %
FVC-Post: 1.61 L
FVC-Pre: 1.55 L
POST FEV1/FVC RATIO: 85 %
PRE FEV1/FVC RATIO: 81 %
Post FEV6/FVC ratio: 100 %
Pre FEV6/FVC Ratio: 100 %
RV % pred: 112 %
RV: 2.21 L
TLC % pred: 90 %
TLC: 3.76 L

## 2015-09-30 ENCOUNTER — Encounter: Payer: Self-pay | Admitting: Adult Health

## 2015-09-30 ENCOUNTER — Ambulatory Visit (INDEPENDENT_AMBULATORY_CARE_PROVIDER_SITE_OTHER): Payer: Medicare Other | Admitting: Adult Health

## 2015-09-30 DIAGNOSIS — J4530 Mild persistent asthma, uncomplicated: Secondary | ICD-10-CM

## 2015-09-30 DIAGNOSIS — J45909 Unspecified asthma, uncomplicated: Secondary | ICD-10-CM | POA: Insufficient documentation

## 2015-09-30 NOTE — Progress Notes (Signed)
Subjective:    Patient ID: Kirsten [name], female    DOB: 08/25/1940, 75 y.o.   MRN: [id number]  HPI 75 yo female never smoker with chronic asthma   09/30/2015 Follow up : Asthma  Pt returns for 2 month follow up and PFT .  Last visit. Patient was changed from Qvar to [facility]. Having more trouble with breathing and getting winded with walking. Also been having more wheezing and dry cough . Patient says her breathing is really unchanged. She does not feel that BREO has made much difference. Would like to go back on Qvar. Says that BREO makes her feel jittery.  She had pulmonary function test done on July 27 that showed an FEV1 at 85%, ratio 85, FVC 74%, 9% bronchodilator change. Mid flow showed significant reversibility. DLCO 63%. This is similar to her primary function test in 2014. Chest x-ray last visit showed no acute process. She has been seen by cardiology with an echo done on 08/29/2015 that showed EF at 60-65%,. Says she had a stress test couple of years ago and was told it was neg.  She does wonder if her symptoms are caused by flecainide, however, she says it is hard to determine  as she had shortness of breath. Prior to starting flecainide Patient says she's been seen in dermatology for Diffuse alopecia .  Patient denies any chest pain, orthopnea, PND, or increased leg swelling Walk test in the office with no significant desaturations. She says she's had recent lab work done with her primary care physician and told it was normal. She was previously on Spiriva however, says that she has not been using this on a regular basis.  TEST  CXR 07/24/12 >> no acute process PFT 07/26/12 >> FEV1 1.38 (81%), post-BD FEV1% 79, TLC 5.72 (113%), RV 3.01 (158%), DLCO 60%, +BD response   Past Medical History:  Diagnosis Date  . Allergy    occasional seasonal  . Asthma   . COPD (chronic obstructive pulmonary disease) ([place])   . Emphysema of lung ([place])   . H/O: hysterectomy   . Heart murmur   . HTN  (hypertension)   . HTN (hypertension)   . IGT (impaired glucose tolerance)   . Irregular heart rate    take Flecinade  . Osteopenia   . Palpitations   . Thyroid disease    hypothyroid  . Tinnitus   . Tremor, essential   . Vertigo    Current Outpatient Prescriptions on File Prior to Visit  Medication Sig Dispense Refill  . albuterol (PROAIR HFA) 108 (90 BASE) MCG/ACT inhaler Inhale 2 puffs into the lungs every 6 (six) hours as needed for wheezing.    Marland Kitchen aspirin 81 MG tablet Take 81 mg by mouth daily.      . Biotin 5000 MCG TABS Take 10,000 mcg by mouth daily. \    . CINNAMON PO Take 1,000 mg by mouth daily.      Marland Kitchen estrogens, conjugated, (PREMARIN) 0.9 MG tablet Take 0.9 mg by mouth as directed. Take daily for 21 days then do not take for 7 days.    . flecainide (TAMBOCOR) 50 MG tablet Take one by mouth twice daily and an additional if needed for heart rate    . fluticasone furoate-vilanterol (BREO ELLIPTA) 100-25 MCG/INH AEPB Inhale 1 puff into the lungs daily. 1 each 5  . Glucosamine-Chondroitin (OSTEO BI-FLEX REGULAR STRENGTH PO) Take 1 tablet by mouth daily.    . hydrochlorothiazide (HYDRODIURIL) 12.5 MG tablet  Take 12.5 mg by mouth daily.     . iron polysaccharides (NIFEREX) 150 MG capsule Take 150 mg by mouth daily.    Marland Kitchen levothyroxine (SYNTHROID, LEVOTHROID) 50 MCG tablet Take 50 mcg by mouth daily.     . Magnesium 400 MG CAPS Take 1 tablet by mouth daily.    . Misc Natural Products (OSTEO BI-FLEX JOINT SHIELD PO) Take 1 capsule by mouth daily.     . Sodium Fluoride (CLINPRO 5000) 1.1 % PSTE Place onto teeth.    . SPIRIVA HANDIHALER 18 MCG inhalation capsule PLACE 1 CAPSULE INTO INHALER AND INHALE DAILY. 90 capsule 1  . verapamil (CALAN-SR) 120 MG CR tablet TAKE 1 TABLET (120 MG TOTAL) BY MOUTH AT BEDTIME. 90 tablet 3  . Vitamin D, Ergocalciferol, (DRISDOL) 50000 UNITS CAPS Take 50,000 Units by mouth. Twice a month     . zolpidem (AMBIEN) 10 MG tablet Take 10 mg by mouth at bedtime  as needed for sleep.      No current facility-administered medications on file prior to visit.       Review of Systems Constitutional:   No  weight loss, night sweats,  Fevers, chills, fatigue, or  lassitude.  HEENT:   No headaches,  Difficulty swallowing,  Tooth/dental problems, or  Sore throat,                No sneezing, itching, ear ache, nasal congestion, post nasal drip,   CV:  No chest pain,  Orthopnea, PND, swelling in lower extremities, anasarca, dizziness, palpitations, syncope.   GI  No heartburn, indigestion, abdominal pain, nausea, vomiting, diarrhea, change in bowel habits, loss of appetite, bloody stools.   Resp: No shortness of breath with exertion or at rest.  No excess mucus, no productive cough,  No non-productive cough,  No coughing up of blood.  No change in color of mucus.  No wheezing.  No chest wall deformity  Skin: no rash or lesions.  GU: no dysuria, change in color of urine, no urgency or frequency.  No flank pain, no hematuria   MS:  No joint pain or swelling.  No decreased range of motion.  No back pain.  Psych:  No change in mood or affect. No depression or anxiety.  No memory loss.         Objective:   Physical Exam . Vitals:   09/30/15 0911  BP: 136/66  Pulse: (!) 50  Temp: 97.5 F (36.4 C)  TempSrc: Oral  SpO2: 93%  Weight: 126 lb (57.2 kg)  Height: 5' (1.524 m)   GEN: A/Ox3; pleasant , NAD,Elderly   HEENT:  [place]/AT,  EACs-clear, TMs-wnl, NOSE-clear, THROAT-clear, no lesions, no postnasal drip or exudate noted.   NECK:  Supple w/ fair ROM; no JVD; normal carotid impulses w/o bruits; no thyromegaly or nodules palpated; no lymphadenopathy.    RESP  Clear  P & A; w/o, wheezes/ rales/ or rhonchi. no accessory muscle use, no dullness to percussion  CARD:  RRR, no m/r/g  , no peripheral edema, pulses intact, no cyanosis or clubbing.  GI:   Soft & nt; nml bowel sounds; no organomegaly or masses detected.   Musco: Warm bil, no  deformities or joint swelling noted.   Neuro: alert, no focal deficits noted.    Skin: Warm, no lesions or rashes  Tammy Parrett NP-C  [facility]  09/30/2015        Assessment & Plan:

## 2015-09-30 NOTE — Assessment & Plan Note (Addendum)
Chronic asthma in never smoker -PFT show no significant airflow obstruction. Mid flows show reversibility.  Diffucing defect is similar to 2014. No desats with ambulation .  Recent echo  DOE seems out of proportion to her lung function .  Intolerant to [employer] . Will change back to QVAR-at increased dose. (Twice daily  )  . Add back Spiriva . Can take every other day as before.  If dyspnea persists would consider return to cards for further evaluation .   Plan  May stop BREO  Restart QVAR 2 puffs Twice daily  (this is increased dose)  May restart Spiriva 1 puff every other day.  follow up Dr. Halford Chessman  In 3-4 months and As needed   Please contact office for sooner follow up if symptoms do not improve or worsen or seek emergency care

## 2015-09-30 NOTE — Patient Instructions (Addendum)
May stop BREO  Restart QVAR 2 puffs Twice daily  (this is increased dose)  May restart Spiriva 1 puff every other day.  follow up Dr. Halford Chessman  In 3-4 months and As needed   Please contact office for sooner follow up if symptoms do not improve or worsen or seek emergency care

## 2015-10-01 NOTE — Progress Notes (Signed)
Reviewed and agree with assessment/plan.  [name], MD [facility]/Critical Care 10/01/2015, 10:01 AM Pager:  [phone]

## 2015-11-11 DIAGNOSIS — L659 Nonscarring hair loss, unspecified: Secondary | ICD-10-CM | POA: Diagnosis not present

## 2015-11-14 DIAGNOSIS — Z01419 Encounter for gynecological examination (general) (routine) without abnormal findings: Secondary | ICD-10-CM | POA: Diagnosis not present

## 2015-11-14 DIAGNOSIS — L639 Alopecia areata, unspecified: Secondary | ICD-10-CM | POA: Diagnosis not present

## 2015-11-14 DIAGNOSIS — Z1231 Encounter for screening mammogram for malignant neoplasm of breast: Secondary | ICD-10-CM | POA: Diagnosis not present

## 2015-11-14 DIAGNOSIS — Z13820 Encounter for screening for osteoporosis: Secondary | ICD-10-CM | POA: Diagnosis not present

## 2016-01-26 ENCOUNTER — Ambulatory Visit: Payer: PRIVATE HEALTH INSURANCE | Admitting: Pulmonary Disease

## 2016-02-09 DIAGNOSIS — M8588 Other specified disorders of bone density and structure, other site: Secondary | ICD-10-CM | POA: Diagnosis not present

## 2016-02-09 DIAGNOSIS — Z13820 Encounter for screening for osteoporosis: Secondary | ICD-10-CM | POA: Diagnosis not present

## 2016-02-09 DIAGNOSIS — Z1231 Encounter for screening mammogram for malignant neoplasm of breast: Secondary | ICD-10-CM | POA: Diagnosis not present

## 2016-03-16 ENCOUNTER — Ambulatory Visit: Payer: PRIVATE HEALTH INSURANCE | Admitting: Pulmonary Disease

## 2016-03-23 ENCOUNTER — Ambulatory Visit: Payer: PRIVATE HEALTH INSURANCE | Admitting: Internal Medicine

## 2016-03-29 ENCOUNTER — Ambulatory Visit: Payer: Medicare Other | Admitting: Internal Medicine

## 2016-04-13 ENCOUNTER — Encounter: Payer: Self-pay | Admitting: Pulmonary Disease

## 2016-04-13 ENCOUNTER — Ambulatory Visit (INDEPENDENT_AMBULATORY_CARE_PROVIDER_SITE_OTHER): Payer: Medicare Other | Admitting: Pulmonary Disease

## 2016-04-13 VITALS — BP 110/64 | HR 56 | Ht 60.0 in | Wt 129.6 lb

## 2016-04-13 DIAGNOSIS — J449 Chronic obstructive pulmonary disease, unspecified: Secondary | ICD-10-CM

## 2016-04-13 NOTE — Patient Instructions (Signed)
Spiriva one puff daily Qvar two puffs twice per day  Call when you are ready to get refills sent into [facility]  Follow up in 1 year

## 2016-04-13 NOTE — Progress Notes (Signed)
Current Outpatient Prescriptions on File Prior to Visit  Medication Sig  . albuterol (PROAIR HFA) 108 (90 BASE) MCG/ACT inhaler Inhale 2 puffs into the lungs every 6 (six) hours as needed for wheezing.  Marland Kitchen aspirin 81 MG tablet Take 81 mg by mouth daily.    . beclomethasone (QVAR) 80 MCG/ACT inhaler Inhale 2 puffs into the lungs 2 (two) times daily.  . Biotin 5000 MCG TABS Take 10,000 mcg by mouth daily. \  . CINNAMON PO Take 1,000 mg by mouth daily.    Marland Kitchen estrogens, conjugated, (PREMARIN) 0.9 MG tablet Take 0.9 mg by mouth as directed. Take daily for 21 days then do not take for 7 days.  . flecainide (TAMBOCOR) 50 MG tablet Take one by mouth twice daily and an additional if needed for heart rate  . Glucosamine-Chondroitin (OSTEO BI-FLEX REGULAR STRENGTH PO) Take 1 tablet by mouth daily.  . hydrochlorothiazide (HYDRODIURIL) 12.5 MG tablet Take 12.5 mg by mouth daily.   . iron polysaccharides (NIFEREX) 150 MG capsule Take 150 mg by mouth daily.  Marland Kitchen levothyroxine (SYNTHROID, LEVOTHROID) 50 MCG tablet Take 50 mcg by mouth daily.   . Magnesium 400 MG CAPS Take 1 tablet by mouth daily.  . Misc Natural Products (OSTEO BI-FLEX JOINT SHIELD PO) Take 1 capsule by mouth daily.   . Sodium Fluoride (CLINPRO 5000) 1.1 % PSTE Place onto teeth.  . SPIRIVA HANDIHALER 18 MCG inhalation capsule PLACE 1 CAPSULE INTO INHALER AND INHALE DAILY.  . verapamil (CALAN-SR) 120 MG CR tablet TAKE 1 TABLET (120 MG TOTAL) BY MOUTH AT BEDTIME.  Marland Kitchen Vitamin D, Ergocalciferol, (DRISDOL) 50000 UNITS CAPS Take 50,000 Units by mouth. Twice a month   . zolpidem (AMBIEN) 10 MG tablet Take 10 mg by mouth at bedtime as needed for sleep.    No current facility-administered medications on file prior to visit.     Chief Complaint  Patient presents with  . Follow-up    Pt reports "spells" of increased SOB but she manages to work through them with rest and pursed lip breathing.     Pulmonary tests CXR 07/24/12 >> no acute process PFT  07/26/12 >> FEV1 1.38 (81%), post-BD FEV1% 79, TLC 5.72 (113%), RV 3.01 (158%), DLCO 60%, +BD response PFT 09/18/15 >> FEV1 1.37 (85%), FEV1% 85, TLC 3.76 (90%), DLCO 63%, +BD, increased RV/TLC ratio  Cardiac tests Echo 08/29/15 >> EF 60 to 65%, mild MR  Past medical history HTN, Osteopenia, Vertigo, Hypothyroidism, Palpitations, Alopecia  Past surgical history, Family history, Social history, Allergies reviewed.  Vital signs BP 110/64 (BP Location: Left Arm, Cuff Size: Normal)   Pulse (!) 56   Ht 5' (1.524 m)   Wt 129 lb 9.6 oz (58.8 kg)   SpO2 96%   BMI 25.31 kg/m    History of Present Illness: Kirsten [name] is a 76 y.o. female never smoker with chronic obstructive asthma.  Her breathing has been stable.  She notices more trouble when she visits her mother in [place].  She does better in [place].  She is not having much cough, wheeze, or chest tightness.  She is using spiriva daily for the most part.  She uses qvar bid.  She hasn't needed to use albuterol much.  She denies sinus congestion, sore throat, swelling, or chest pain.  She has noticed rash under her lip.  Physical Exam:  General - pleasant Eyes - pupils reactive ENT - no sinus tenderness, no oral exudate, no LAN Cardiac - regular, no murmur  Chest - no wheeze, rales Back - no tenderness Abd - soft, non tender Ext - no edema Neuro - mild tremor Skin - scaling under lip Psych - normal mood  Assessment/Plan:   Chronic obstructive asthma. - continue Qvar and spiriva - she will call when she is ready for refills to be sent in - she is intolerant of LABA's due to palpitations and tremors  Eczema. - she will f/u with her dermatologist   Patient Instructions  Spiriva one puff daily Qvar two puffs twice per day  Call when you are ready to get refills sent into [facility]  Follow up in 1 year   [name], MD [place] Pager:  [phone] 04/13/2016, 11:01 AM

## 2016-04-23 ENCOUNTER — Encounter: Payer: Self-pay | Admitting: Internal Medicine

## 2016-04-23 ENCOUNTER — Ambulatory Visit (INDEPENDENT_AMBULATORY_CARE_PROVIDER_SITE_OTHER): Payer: Medicare Other | Admitting: Internal Medicine

## 2016-04-23 ENCOUNTER — Encounter (INDEPENDENT_AMBULATORY_CARE_PROVIDER_SITE_OTHER): Payer: Self-pay

## 2016-04-23 VITALS — BP 132/74 | HR 53 | Ht 60.0 in | Wt 131.0 lb

## 2016-04-23 DIAGNOSIS — I493 Ventricular premature depolarization: Secondary | ICD-10-CM

## 2016-04-23 NOTE — Patient Instructions (Signed)

## 2016-04-23 NOTE — Progress Notes (Signed)
HPI Kirsten [name] returns today for followup. She is a very pleasant 76 year old woman with palpitations and COPD. She has done well since I saw her last and her dyspnea is improved. She has been on 50 mg twice daily of flecainide which initially improved her symptoms. She notes that her flecainide dosing has been stable and she denies noncompliance. She has had no syncope. Her dyspnea has improved with control of her COPD. She has been caring for her mother from [place]. She takes her to HD in [place]. No Known Allergies   Current Outpatient Prescriptions  Medication Sig Dispense Refill  . albuterol (PROAIR HFA) 108 (90 BASE) MCG/ACT inhaler Inhale 2 puffs into the lungs every 6 (six) hours as needed for wheezing.    Marland Kitchen aspirin 81 MG tablet Take 81 mg by mouth daily.      . beclomethasone (QVAR) 80 MCG/ACT inhaler Inhale 2 puffs into the lungs 2 (two) times daily.    . Biotin 5000 MCG TABS Take 5,000 mcg by mouth daily. \    . CINNAMON PO Take 1,000 mg by mouth daily.      Marland Kitchen estrogens, conjugated, (PREMARIN) 0.9 MG tablet Take 0.9 mg by mouth as directed. Take daily for 21 days then do not take for 7 days.    . flecainide (TAMBOCOR) 50 MG tablet Take one by mouth twice daily and an additional if needed for heart rate    . Glucosamine-Chondroitin (OSTEO BI-FLEX REGULAR STRENGTH PO) Take 1 tablet by mouth daily.    . hydrochlorothiazide (HYDRODIURIL) 12.5 MG tablet Take 12.5 mg by mouth daily.     . iron polysaccharides (NIFEREX) 150 MG capsule Take 150 mg by mouth daily.    Marland Kitchen levothyroxine (SYNTHROID, LEVOTHROID) 50 MCG tablet Take 50 mcg by mouth daily.     . Magnesium 400 MG CAPS Take 1 tablet by mouth daily.    . Misc Natural Products (OSTEO BI-FLEX JOINT SHIELD PO) Take 1 capsule by mouth daily.     . Sodium Fluoride (CLINPRO 5000) 1.1 % PSTE Place onto teeth as directed.     Marland Kitchen SPIRIVA HANDIHALER 18 MCG inhalation capsule PLACE 1 CAPSULE INTO INHALER AND INHALE DAILY. 90 capsule 1  . verapamil  (CALAN-SR) 120 MG CR tablet TAKE 1 TABLET (120 MG TOTAL) BY MOUTH AT BEDTIME. 90 tablet 3  . Vitamin D, Ergocalciferol, (DRISDOL) 50000 UNITS CAPS Take 50,000 Units by mouth. Twice a month     . zolpidem (AMBIEN) 10 MG tablet Take 10 mg by mouth at bedtime as needed for sleep.      No current facility-administered medications for this visit.      Past Medical History:  Diagnosis Date  . Allergy    occasional seasonal  . Asthma   . COPD (chronic obstructive pulmonary disease) ([place])   . Emphysema of lung ([place])   . H/O: hysterectomy   . Heart murmur   . HTN (hypertension)   . HTN (hypertension)   . IGT (impaired glucose tolerance)   . Irregular heart rate    take Flecinade  . Osteopenia   . Palpitations   . Thyroid disease    hypothyroid  . Tinnitus   . Tremor, essential   . Vertigo     ROS:   All systems reviewed and negative except as noted in the HPI.   Past Surgical History:  Procedure Laterality Date  . CATARACT EXTRACTION     both eyes- February 2015  . orthopedic procedure  both feet  . TRIGGER FINGER RELEASE     right hand     Family History  Problem Relation Age of Onset  . Kidney disease Mother   . Cirrhosis Mother   . Lung cancer Father   . Heart disease Maternal Grandmother   . Colon cancer Neg Hx   . Esophageal cancer Neg Hx   . Rectal cancer Neg Hx   . Stomach cancer Neg Hx   . Pancreatic cancer Neg Hx      Social History   Social History  . Marital status: Divorced    Spouse name: N/A  . Number of children: 0  . Years of education: N/A   Occupational History  . retired Estate agent for [employer] History Main Topics  . Smoking status: Never Smoker  . Smokeless tobacco: Never Used  . Alcohol use No  . Drug use: No  . Sexual activity: Not on file   Other Topics Concern  . Not on file   Social History Narrative  . No narrative on file     BP 132/74   Pulse (!) 53   Ht 5' (1.524 m)   Wt 131 lb  (59.4 kg)   BMI 25.58 kg/m   Physical Exam:  Well appearing 76 year old woman, NAD HEENT: Unremarkable Neck:  6 cm JVD, no thyromegally Back:  No CVA tenderness Lungs:  Clear with no wheezes, rales, or rhonchi. HEART:  Regular rate rhythm, no murmurs, no rubs, no clicks Abd:  soft, positive bowel sounds, no organomegally, no rebound, no guarding Ext:  2 plus pulses, no edema, no cyanosis, no clubbing Skin:  No rashes no nodules Neuro:  CN II through XII intact, motor grossly intact  ECG - sinus bradycardia  Assess/Plan: 1. Palpitations - her symptoms are well controlled. She will continue her twice daily flecainide. I have asked her to take an additional flecainide if her palpitations worsen. 2. Sob - she is improved. She will continue her current meds. 3. COPD - she will continue her bronchodilators.  Mikle Bosworth.D.

## 2016-07-21 DIAGNOSIS — L659 Nonscarring hair loss, unspecified: Secondary | ICD-10-CM | POA: Diagnosis not present

## 2016-10-21 DIAGNOSIS — E784 Other hyperlipidemia: Secondary | ICD-10-CM | POA: Diagnosis not present

## 2016-10-21 DIAGNOSIS — E559 Vitamin D deficiency, unspecified: Secondary | ICD-10-CM | POA: Diagnosis not present

## 2016-10-21 DIAGNOSIS — N390 Urinary tract infection, site not specified: Secondary | ICD-10-CM | POA: Diagnosis not present

## 2016-10-21 DIAGNOSIS — R7302 Impaired glucose tolerance (oral): Secondary | ICD-10-CM | POA: Diagnosis not present

## 2016-10-21 DIAGNOSIS — E038 Other specified hypothyroidism: Secondary | ICD-10-CM | POA: Diagnosis not present

## 2016-10-21 DIAGNOSIS — R8299 Other abnormal findings in urine: Secondary | ICD-10-CM | POA: Diagnosis not present

## 2016-10-21 DIAGNOSIS — I10 Essential (primary) hypertension: Secondary | ICD-10-CM | POA: Diagnosis not present

## 2016-10-26 DIAGNOSIS — E784 Other hyperlipidemia: Secondary | ICD-10-CM | POA: Diagnosis not present

## 2016-10-26 DIAGNOSIS — G250 Essential tremor: Secondary | ICD-10-CM | POA: Diagnosis not present

## 2016-10-26 DIAGNOSIS — Z0000 Encounter for general adult medical examination without abnormal findings: Secondary | ICD-10-CM | POA: Diagnosis not present

## 2016-10-26 DIAGNOSIS — E038 Other specified hypothyroidism: Secondary | ICD-10-CM | POA: Diagnosis not present

## 2016-10-26 DIAGNOSIS — R7302 Impaired glucose tolerance (oral): Secondary | ICD-10-CM | POA: Diagnosis not present

## 2016-10-26 DIAGNOSIS — Z6826 Body mass index (BMI) 26.0-26.9, adult: Secondary | ICD-10-CM | POA: Diagnosis not present

## 2016-10-26 DIAGNOSIS — Z23 Encounter for immunization: Secondary | ICD-10-CM | POA: Diagnosis not present

## 2016-10-26 DIAGNOSIS — M8580 Other specified disorders of bone density and structure, unspecified site: Secondary | ICD-10-CM | POA: Diagnosis not present

## 2016-10-26 DIAGNOSIS — I10 Essential (primary) hypertension: Secondary | ICD-10-CM | POA: Diagnosis not present

## 2016-10-26 DIAGNOSIS — J449 Chronic obstructive pulmonary disease, unspecified: Secondary | ICD-10-CM | POA: Diagnosis not present

## 2016-10-26 DIAGNOSIS — Z1389 Encounter for screening for other disorder: Secondary | ICD-10-CM | POA: Diagnosis not present

## 2016-10-27 DIAGNOSIS — Z1212 Encounter for screening for malignant neoplasm of rectum: Secondary | ICD-10-CM | POA: Diagnosis not present

## 2016-11-10 DIAGNOSIS — L638 Other alopecia areata: Secondary | ICD-10-CM | POA: Diagnosis not present

## 2016-11-10 DIAGNOSIS — D225 Melanocytic nevi of trunk: Secondary | ICD-10-CM | POA: Diagnosis not present

## 2016-11-10 DIAGNOSIS — L814 Other melanin hyperpigmentation: Secondary | ICD-10-CM | POA: Diagnosis not present

## 2016-11-10 DIAGNOSIS — L821 Other seborrheic keratosis: Secondary | ICD-10-CM | POA: Diagnosis not present

## 2017-01-12 DIAGNOSIS — Z1231 Encounter for screening mammogram for malignant neoplasm of breast: Secondary | ICD-10-CM | POA: Diagnosis not present

## 2017-02-10 DIAGNOSIS — I10 Essential (primary) hypertension: Secondary | ICD-10-CM | POA: Diagnosis not present

## 2017-02-10 DIAGNOSIS — E038 Other specified hypothyroidism: Secondary | ICD-10-CM | POA: Diagnosis not present

## 2017-02-10 DIAGNOSIS — E7849 Other hyperlipidemia: Secondary | ICD-10-CM | POA: Diagnosis not present

## 2017-02-16 ENCOUNTER — Other Ambulatory Visit: Payer: Self-pay | Admitting: Internal Medicine

## 2017-02-16 DIAGNOSIS — Z139 Encounter for screening, unspecified: Secondary | ICD-10-CM

## 2017-04-11 ENCOUNTER — Ambulatory Visit
Admission: RE | Admit: 2017-04-11 | Discharge: 2017-04-11 | Disposition: A | Discharge status: Home / Self Care | Payer: Medicare Other | Source: Ambulatory Visit | Attending: Internal Medicine | Admitting: Internal Medicine

## 2017-04-11 ENCOUNTER — Other Ambulatory Visit: Payer: Self-pay | Admitting: Internal Medicine

## 2017-04-11 DIAGNOSIS — Z139 Encounter for screening, unspecified: Secondary | ICD-10-CM

## 2017-04-11 DIAGNOSIS — N63 Unspecified lump in unspecified breast: Secondary | ICD-10-CM

## 2017-04-29 ENCOUNTER — Ambulatory Visit
Admission: RE | Admit: 2017-04-29 | Discharge: 2017-04-29 | Disposition: A | Discharge status: Home / Self Care | Payer: Medicare Other | Source: Ambulatory Visit | Attending: Internal Medicine | Admitting: Internal Medicine

## 2017-04-29 DIAGNOSIS — R922 Inconclusive mammogram: Secondary | ICD-10-CM | POA: Diagnosis not present

## 2017-04-29 DIAGNOSIS — N63 Unspecified lump in unspecified breast: Secondary | ICD-10-CM

## 2017-04-29 DIAGNOSIS — N644 Mastodynia: Secondary | ICD-10-CM | POA: Diagnosis not present

## 2017-08-01 ENCOUNTER — Ambulatory Visit: Payer: Medicare Other | Admitting: Pulmonary Disease

## 2017-08-15 ENCOUNTER — Ambulatory Visit: Payer: Medicare Other | Admitting: Internal Medicine

## 2017-08-23 DIAGNOSIS — H43811 Vitreous degeneration, right eye: Secondary | ICD-10-CM | POA: Diagnosis not present

## 2017-09-21 ENCOUNTER — Ambulatory Visit (INDEPENDENT_AMBULATORY_CARE_PROVIDER_SITE_OTHER)
Admission: RE | Admit: 2017-09-21 | Discharge: 2017-09-21 | Disposition: A | Discharge status: Home / Self Care | Payer: Medicare Other | Source: Ambulatory Visit | Attending: Pulmonary Disease | Admitting: Pulmonary Disease

## 2017-09-21 ENCOUNTER — Ambulatory Visit (INDEPENDENT_AMBULATORY_CARE_PROVIDER_SITE_OTHER): Payer: Medicare Other | Admitting: Pulmonary Disease

## 2017-09-21 ENCOUNTER — Encounter: Payer: Self-pay | Admitting: Pulmonary Disease

## 2017-09-21 VITALS — BP 122/60 | HR 53 | Ht 60.0 in | Wt 132.0 lb

## 2017-09-21 DIAGNOSIS — R05 Cough: Secondary | ICD-10-CM | POA: Diagnosis not present

## 2017-09-21 DIAGNOSIS — R0609 Other forms of dyspnea: Secondary | ICD-10-CM | POA: Diagnosis not present

## 2017-09-21 DIAGNOSIS — J449 Chronic obstructive pulmonary disease, unspecified: Secondary | ICD-10-CM | POA: Diagnosis not present

## 2017-09-21 DIAGNOSIS — R0602 Shortness of breath: Secondary | ICD-10-CM | POA: Diagnosis not present

## 2017-09-21 NOTE — Progress Notes (Signed)
[facility]  Chief Complaint  Patient presents with  . Follow-up    1 year ROV     Constitutional: BP 122/60 (BP Location: Left Arm, Cuff Size: Normal)   Pulse (!) 53   Ht 5' (1.524 m)   Wt 132 lb (59.9 kg)   SpO2 90%   BMI 25.78 kg/m   History of Present Illness: Kirsten [name] is a 77 y.o. female never smoker with chronic obstructive asthma.  Her mother passed away last September 30, 2022.  She wasn't able to keep her appointment then.  She has intermittent cough.  She doesn't usually bring up sputum.  She gets a trickle in her throat.  She isn't having wheeze or sinus congestion.  She gets winded more easily with activity and feels like her heart races.  She is not having abdominal pain, gland swelling, or leg swelling.  She isn't having leg pains.  She feels that Qvar and spiriva help.  She has been using spiriva daily, but hasn't been using qvar as consistently.  She hasn't been using albuterol much.  SpO2 on room air today on finger probe was 90%, but she was wearing nail polish.  Using forehead probe SpO2 was 97%.  CAT COPD score 16.  Comprehensive Respiratory Exam:  Appearance - well kempt  ENMT - nasal mucosa moist, turbinates clear, midline nasal septum, no dental lesions, no gingival bleeding, no oral exudates, no tonsillar hypertrophy Neck - no masses, trachea midline, no thyromegaly, no elevation in JVP Respiratory - normal appearance of chest wall, normal respiratory effort w/o accessory muscle use, no dullness on percussion, no wheezing or rales CV - s1s2 regular rate and rhythm, 2/6 murmurs, no peripheral edema, no varicosities, radial pulses symmetric GI - soft, non tender, no masses, no hepatosplenomegaly Lymph - no adenopathy noted in neck and axillary areas MSK - normal muscle strength and tone, normal gait, mild tremor Ext - no cyanosis, clubbing, or joint inflammation noted Skin - no rashes, lesions, or ulcers Neuro - oriented to  person, place, and time Psych - normal mood and affect  Discussion: She has persistent cough and progressive dyspnea on exertion.  She hasn't been using Qvar consistently.  She has history of mild mitral regurgitation.  Previously seen by Dr. Lovena Le with cardiology for palpitations.  Assessment/Plan:  Dyspnea on exertion. - chest xray today - will arrange for echocardiogram - will then determine if she needs CT chest, repeat PFTs, and/or cardiology assessment - she says she will be getting lab tests with PCP later this month  Chronic obstructive asthma. - discussed roles for her different inhalers - explained she should be using Qvar twice per day, and spiriva once per day - continue prn albuterol   Patient Instructions  Chest xray today  Will arrange for Echocardiogram  Follow up in 6 months    Kirsten Mires, MD [place] 09/21/2017, 9:27 AM  Flow Sheet  Pulmonary tests: CXR 07/24/12 >> no acute process PFT 07/26/12 >> FEV1 1.38 (81%), post-BD FEV1% 79, TLC 5.72 (113%), RV 3.01 (158%), DLCO 60%, +BD response PFT September 30, 2015 >> FEV1 1.37 (85%), FEV1% 85, TLC 3.76 (90%), DLCO 63%, +BD, increased RV/TLC ratio  Cardiac tests Echo 08/29/15 >> EF 60 to 65%, mild MR  Past Medical History: She  has a past medical history of Allergy, Asthma, COPD (chronic obstructive pulmonary disease) ([place]), Emphysema of lung ([place]), H/O: hysterectomy, Heart murmur, HTN (hypertension), HTN (hypertension), IGT (impaired glucose tolerance), Irregular heart  rate, Osteopenia, Palpitations, Thyroid disease, Tinnitus, Tremor, essential, and Vertigo.  Past Surgical History: She  has a past surgical history that includes orthopedic procedure; Cataract extraction; and Trigger finger release.  Family History: Her family history includes Cirrhosis in her mother; Heart disease in her maternal grandmother; Kidney disease in her mother; Lung cancer in her father.  Social History: She   reports that she has never smoked. She has never used smokeless tobacco. She reports that she does not drink alcohol or use drugs.  Medications: Allergies as of 09/21/2017   No Known Allergies     Medication List        Accurate as of 09/21/17  9:27 AM. Always use your most recent med list.          aspirin 81 MG tablet Take 81 mg by mouth daily.   beclomethasone 80 MCG/ACT inhaler Commonly known as:  QVAR Inhale 2 puffs into the lungs 2 (two) times daily.   Biotin 5000 MCG Tabs Take 5,000 mcg by mouth daily. \   CINNAMON PO Take 1,000 mg by mouth daily.   CLINPRO 5000 1.1 % Pste Generic drug:  Sodium Fluoride Place onto teeth as directed.   estrogens (conjugated) 0.9 MG tablet Commonly known as:  PREMARIN Take 0.9 mg by mouth as directed. Take daily for 21 days then do not take for 7 days.   flecainide 50 MG tablet Commonly known as:  TAMBOCOR Take one by mouth twice daily and an additional if needed for heart rate   hydrochlorothiazide 12.5 MG tablet Commonly known as:  HYDRODIURIL Take 12.5 mg by mouth daily.   levothyroxine 50 MCG tablet Commonly known as:  SYNTHROID, LEVOTHROID Take 50 mcg by mouth daily.   OSTEO BI-FLEX JOINT SHIELD PO Take 1 capsule by mouth daily.   OSTEO BI-FLEX REGULAR STRENGTH PO Take 1 tablet by mouth daily.   PROAIR HFA 108 (90 Base) MCG/ACT inhaler Generic drug:  albuterol Inhale 2 puffs into the lungs every 6 (six) hours as needed for wheezing.   SPIRIVA HANDIHALER 18 MCG inhalation capsule Generic drug:  tiotropium PLACE 1 CAPSULE INTO INHALER AND INHALE DAILY.   verapamil 120 MG CR tablet Commonly known as:  CALAN-SR TAKE 1 TABLET (120 MG TOTAL) BY MOUTH AT BEDTIME.   Vitamin D (Ergocalciferol) 50000 units Caps capsule Commonly known as:  DRISDOL Take 50,000 Units by mouth. Twice a month   zolpidem 10 MG tablet Commonly known as:  AMBIEN Take 10 mg by mouth at bedtime as needed for sleep.

## 2017-09-21 NOTE — Patient Instructions (Signed)
Chest xray today  Will arrange for Echocardiogram  Follow up in 6 months

## 2017-09-22 ENCOUNTER — Telehealth: Payer: Self-pay | Admitting: Pulmonary Disease

## 2017-09-22 NOTE — Telephone Encounter (Signed)
Dg Chest 2 View  Result Date: 09/21/2017 CLINICAL DATA:  Chronic dry cough and shortness of breath. EXAM: CHEST - 2 VIEW COMPARISON:  08/04/2015 FINDINGS: Heart size is normal. Chronic aortic atherosclerosis. The pulmonary vascularity is normal. The lungs are clear. Lung volumes are normal. No effusions. No significant bone finding. IMPRESSION: No active disease. Aortic atherosclerosis. Normal lung volumes. No sign of chronic lung disease. Electronically Signed   By: Nelson Chimes M.D.   On: 09/21/2017 10:19    Please let her know that lungs were clear on CXR.  Will call back after review of her echocardiogram.

## 2017-09-22 NOTE — Telephone Encounter (Signed)
Called and spoke with patient regarding results.  Informed the patient of results and recommendations today. Pt verbalized understanding and denied any questions or concerns at this time.  Nothing further needed.  

## 2017-10-17 ENCOUNTER — Other Ambulatory Visit: Payer: Self-pay

## 2017-10-17 ENCOUNTER — Ambulatory Visit (INDEPENDENT_AMBULATORY_CARE_PROVIDER_SITE_OTHER): Payer: Medicare Other | Admitting: Internal Medicine

## 2017-10-17 ENCOUNTER — Ambulatory Visit (HOSPITAL_COMMUNITY): Payer: Medicare Other | Attending: Cardiovascular Disease

## 2017-10-17 ENCOUNTER — Encounter: Payer: Self-pay | Admitting: Internal Medicine

## 2017-10-17 VITALS — BP 130/72 | HR 52 | Ht 60.0 in | Wt 133.8 lb

## 2017-10-17 DIAGNOSIS — J449 Chronic obstructive pulmonary disease, unspecified: Secondary | ICD-10-CM | POA: Diagnosis not present

## 2017-10-17 DIAGNOSIS — I5030 Unspecified diastolic (congestive) heart failure: Secondary | ICD-10-CM | POA: Insufficient documentation

## 2017-10-17 DIAGNOSIS — R002 Palpitations: Secondary | ICD-10-CM

## 2017-10-17 DIAGNOSIS — I493 Ventricular premature depolarization: Secondary | ICD-10-CM

## 2017-10-17 DIAGNOSIS — I110 Hypertensive heart disease with heart failure: Secondary | ICD-10-CM | POA: Insufficient documentation

## 2017-10-17 DIAGNOSIS — I083 Combined rheumatic disorders of mitral, aortic and tricuspid valves: Secondary | ICD-10-CM | POA: Diagnosis not present

## 2017-10-17 DIAGNOSIS — R0609 Other forms of dyspnea: Secondary | ICD-10-CM | POA: Diagnosis not present

## 2017-10-17 MED ORDER — VERAPAMIL HCL ER 120 MG PO TBCR: EXTENDED_RELEASE_TABLET | ORAL | 3 refills | Status: DC

## 2017-10-17 MED ORDER — FLECAINIDE ACETATE 50 MG PO TABS: 50.0000 mg | ORAL_TABLET | Freq: Two times a day (BID) | ORAL | 3 refills | Status: DC

## 2017-10-17 NOTE — Progress Notes (Signed)
HPI Kirsten [name] returns today for followup. She is a pleasant 77 yo woman with palpitations who has been well controlled with flecainide. She denies chest pain or sob. She recently got her mother's estate settled.  No Known Allergies   Current Outpatient Medications  Medication Sig Dispense Refill  . albuterol (PROAIR HFA) 108 (90 BASE) MCG/ACT inhaler Inhale 2 puffs into the lungs every 6 (six) hours as needed for wheezing.    Marland Kitchen aspirin 81 MG tablet Take 81 mg by mouth daily.      . beclomethasone (QVAR) 80 MCG/ACT inhaler Inhale 2 puffs into the lungs 2 (two) times daily.    . Biotin 5000 MCG TABS Take 5,000 mcg by mouth daily. \    . CINNAMON PO Take 1,000 mg by mouth daily.      Marland Kitchen estrogens, conjugated, (PREMARIN) 0.9 MG tablet Take 0.9 mg by mouth as directed. Take daily for 21 days then do not take for 7 days.    . flecainide (TAMBOCOR) 50 MG tablet Take 1 tablet (50 mg total) by mouth 2 (two) times daily. Take one by mouth twice daily and an additional if needed for heart rate 180 tablet 3  . Glucosamine-Chondroitin (OSTEO BI-FLEX REGULAR STRENGTH PO) Take 1 tablet by mouth daily.    . hydrochlorothiazide (HYDRODIURIL) 12.5 MG tablet Take 12.5 mg by mouth daily.     . Misc Natural Products (OSTEO BI-FLEX JOINT SHIELD PO) Take 1 capsule by mouth daily.     . rosuvastatin (CRESTOR) 10 MG tablet Take 1 tablet by mouth daily.    . Sodium Fluoride (CLINPRO 5000) 1.1 % PSTE Place onto teeth as directed.     Marland Kitchen SPIRIVA HANDIHALER 18 MCG inhalation capsule PLACE 1 CAPSULE INTO INHALER AND INHALE DAILY. 90 capsule 1  . SYNTHROID 75 MCG tablet Take 1 tablet by mouth daily.    . verapamil (CALAN-SR) 120 MG CR tablet TAKE 1 TABLET (120 MG TOTAL) BY MOUTH AT BEDTIME. 90 tablet 3  . Vitamin D, Ergocalciferol, (DRISDOL) 50000 UNITS CAPS Take 50,000 Units by mouth. Twice a month     . zolpidem (AMBIEN) 10 MG tablet Take 10 mg by mouth at bedtime as needed for sleep.      No current  facility-administered medications for this visit.      Past Medical History:  Diagnosis Date  . Allergy    occasional seasonal  . Asthma   . COPD (chronic obstructive pulmonary disease) ([place])   . Emphysema of lung ([place])   . H/O: hysterectomy   . Heart murmur   . HTN (hypertension)   . HTN (hypertension)   . IGT (impaired glucose tolerance)   . Irregular heart rate    take Flecinade  . Osteopenia   . Palpitations   . Thyroid disease    hypothyroid  . Tinnitus   . Tremor, essential   . Vertigo     ROS:   All systems reviewed and negative except as noted in the HPI.   Past Surgical History:  Procedure Laterality Date  . CATARACT EXTRACTION     both eyes- February 2015  . orthopedic procedure     both feet  . TRIGGER FINGER RELEASE     right hand     Family History  Problem Relation Age of Onset  . Kidney disease Mother   . Cirrhosis Mother   . Lung cancer Father   . Heart disease Maternal Grandmother   . Colon  cancer Neg Hx   . Esophageal cancer Neg Hx   . Rectal cancer Neg Hx   . Stomach cancer Neg Hx   . Pancreatic cancer Neg Hx      Social History   Socioeconomic History  . Marital status: Divorced    Spouse name: Not on file  . Number of children: 0  . Years of education: Not on file  . Highest education level: Not on file  Occupational History  . Occupation: retired Estate agent for [employer]  . Financial resource strain: Not on file  . Food insecurity:    Worry: Not on file    Inability: Not on file  . Transportation needs:    Medical: Not on file    Non-medical: Not on file  Tobacco Use  . Smoking status: Never Smoker  . Smokeless tobacco: Never Used  Substance and Sexual Activity  . Alcohol use: No  . Drug use: No  . Sexual activity: Not on file  Lifestyle  . Physical activity:    Days per week: Not on file    Minutes per session: Not on file  . Stress: Not on file  Relationships  . Social  connections:    Talks on phone: Not on file    Gets together: Not on file    Attends religious service: Not on file    Active member of club or organization: Not on file    Attends meetings of clubs or organizations: Not on file    Relationship status: Not on file  . Intimate partner violence:    Fear of current or ex partner: Not on file    Emotionally abused: Not on file    Physically abused: Not on file    Forced sexual activity: Not on file  Other Topics Concern  . Not on file  Social History Narrative  . Not on file     BP 130/72   Pulse (!) 52   Ht 5' (1.524 m)   Wt 133 lb 12.8 oz (60.7 kg)   SpO2 94%   BMI 26.13 kg/m   Physical Exam:  Well appearing NAD HEENT: Unremarkable Neck:  No JVD, no thyromegally Lymphatics:  No adenopathy Back:  No CVA tenderness Lungs:  Clear HEART:  Regular rate rhythm, no murmurs, no rubs, no clicks Abd:  soft, positive bowel sounds, no organomegally, no rebound, no guarding Ext:  2 plus pulses, no edema, no cyanosis, no clubbing Skin:  No rashes no nodules Neuro:  CN II through XII intact, motor grossly intact  EKG - sinus bradycardia  Assess/Plan: 1. Palpitations - these are mostly well controlled. She will continue her flecainide. For break through symptoms I have encouraged her to take an extra flecainide or two. 2. COPD - she is well compensated today. She is walking 2 miles a day.  3. Weight gain - now that she is not spending time in [place] with her mother, she has gained weight.  Mikle [place].D.

## 2017-10-17 NOTE — Patient Instructions (Signed)

## 2017-10-25 ENCOUNTER — Telehealth: Payer: Self-pay | Admitting: Pulmonary Disease

## 2017-10-25 DIAGNOSIS — I272 Pulmonary hypertension, unspecified: Secondary | ICD-10-CM

## 2017-10-25 NOTE — Telephone Encounter (Signed)
Echo 10/17/17 >> EF 55 to 60%, grade 1 DD, mild AR, trivial MR, PAS 42 mmHg   Results d/w pt.  She has elevated PA pressures.  CXR from 09/21/17 was unremarkable.  Will repeat PFT and arrange for ONO on room air.

## 2017-10-27 DIAGNOSIS — E038 Other specified hypothyroidism: Secondary | ICD-10-CM | POA: Diagnosis not present

## 2017-10-27 DIAGNOSIS — R82998 Other abnormal findings in urine: Secondary | ICD-10-CM | POA: Diagnosis not present

## 2017-10-27 DIAGNOSIS — I10 Essential (primary) hypertension: Secondary | ICD-10-CM | POA: Diagnosis not present

## 2017-10-27 DIAGNOSIS — R7302 Impaired glucose tolerance (oral): Secondary | ICD-10-CM | POA: Diagnosis not present

## 2017-10-27 DIAGNOSIS — E7849 Other hyperlipidemia: Secondary | ICD-10-CM | POA: Diagnosis not present

## 2017-10-27 DIAGNOSIS — M859 Disorder of bone density and structure, unspecified: Secondary | ICD-10-CM | POA: Diagnosis not present

## 2017-11-01 DIAGNOSIS — R0902 Hypoxemia: Secondary | ICD-10-CM | POA: Diagnosis not present

## 2017-11-01 DIAGNOSIS — J449 Chronic obstructive pulmonary disease, unspecified: Secondary | ICD-10-CM | POA: Diagnosis not present

## 2017-11-03 DIAGNOSIS — E663 Overweight: Secondary | ICD-10-CM | POA: Insufficient documentation

## 2017-11-03 DIAGNOSIS — J449 Chronic obstructive pulmonary disease, unspecified: Secondary | ICD-10-CM | POA: Diagnosis not present

## 2017-11-03 DIAGNOSIS — I10 Essential (primary) hypertension: Secondary | ICD-10-CM | POA: Diagnosis not present

## 2017-11-03 DIAGNOSIS — Z1389 Encounter for screening for other disorder: Secondary | ICD-10-CM | POA: Diagnosis not present

## 2017-11-03 DIAGNOSIS — Z23 Encounter for immunization: Secondary | ICD-10-CM | POA: Diagnosis not present

## 2017-11-03 DIAGNOSIS — Z0000 Encounter for general adult medical examination without abnormal findings: Secondary | ICD-10-CM | POA: Diagnosis not present

## 2017-11-03 DIAGNOSIS — E038 Other specified hypothyroidism: Secondary | ICD-10-CM | POA: Diagnosis not present

## 2017-11-03 DIAGNOSIS — R7302 Impaired glucose tolerance (oral): Secondary | ICD-10-CM | POA: Diagnosis not present

## 2017-11-03 DIAGNOSIS — Z6826 Body mass index (BMI) 26.0-26.9, adult: Secondary | ICD-10-CM | POA: Diagnosis not present

## 2017-11-03 DIAGNOSIS — M859 Disorder of bone density and structure, unspecified: Secondary | ICD-10-CM | POA: Diagnosis not present

## 2017-11-03 DIAGNOSIS — E7849 Other hyperlipidemia: Secondary | ICD-10-CM | POA: Diagnosis not present

## 2017-11-03 DIAGNOSIS — E559 Vitamin D deficiency, unspecified: Secondary | ICD-10-CM | POA: Diagnosis not present

## 2017-11-03 DIAGNOSIS — G250 Essential tremor: Secondary | ICD-10-CM | POA: Diagnosis not present

## 2017-11-03 DIAGNOSIS — I498 Other specified cardiac arrhythmias: Secondary | ICD-10-CM | POA: Diagnosis not present

## 2017-11-04 ENCOUNTER — Ambulatory Visit (INDEPENDENT_AMBULATORY_CARE_PROVIDER_SITE_OTHER): Payer: Medicare Other | Admitting: Pulmonary Disease

## 2017-11-04 DIAGNOSIS — I272 Pulmonary hypertension, unspecified: Secondary | ICD-10-CM

## 2017-11-04 DIAGNOSIS — Z1212 Encounter for screening for malignant neoplasm of rectum: Secondary | ICD-10-CM | POA: Diagnosis not present

## 2017-11-04 LAB — PULMONARY FUNCTION TEST
DL/VA % PRED: 105 %
DL/VA: 4.12 ml/min/mmHg/L
DLCO unc % pred: 70 %
DLCO unc: 11.39 ml/min/mmHg
FEF 25-75 PRE: 1.3 L/s
FEF2575-%Pred-Pre: 101 %
FEV1-%Pred-Pre: 84 %
FEV1-PRE: 1.3 L
FEV1FVC-%Pred-Pre: 106 %
FEV6-%PRED-PRE: 83 %
FEV6-PRE: 1.62 L
FEV6FVC-%PRED-PRE: 106 %
FVC-%Pred-Pre: 78 %
FVC-PRE: 1.62 L
PRE FEV1/FVC RATIO: 80 %
PRE FEV6/FVC RATIO: 100 %
RV % pred: 139 %
RV: 2.8 L
TLC % pred: 106 %
TLC: 4.41 L

## 2017-11-04 NOTE — Progress Notes (Signed)
Patient completed PFT today. Pt refused to to complete the post spiro part of the PFT today. Patient has shaking hands and feeling inside more than normal today. She refuses to make that worse today. Therefore she did not complete the post spiro portion of the test.   I did offer pt the Xopenex treatment instead of the albuterol, pt still refused.

## 2017-11-18 ENCOUNTER — Telehealth: Payer: Self-pay | Admitting: Pulmonary Disease

## 2017-11-18 DIAGNOSIS — R0602 Shortness of breath: Secondary | ICD-10-CM

## 2017-11-18 DIAGNOSIS — G4734 Idiopathic sleep related nonobstructive alveolar hypoventilation: Secondary | ICD-10-CM

## 2017-11-18 NOTE — Telephone Encounter (Signed)
ONO was received from [place]. This has been placed in Dr. Juanetta Gosling look at folder.

## 2017-11-18 NOTE — Telephone Encounter (Signed)
Spoke with pt. She is requesting her PFT and ONO results. States that she had her ONO done 2 weeks ago. Called [facility] and they are going to refax the ONO to triage.  VS - please advise on PFT results.

## 2017-11-23 NOTE — Telephone Encounter (Signed)
Dr. Halford Chessman is back in the office on 11/28/17, this will be addressed at that time.

## 2017-11-29 NOTE — Telephone Encounter (Signed)
Called and spoke with pt letting her know the results of the ONO and stated to her that based on the results, VS wanted her to have a HRCT and a HST followed by an OV with him to reassess and discuss plan in more details.  Pt expressed understanding. Orders have been placed for the HRCT as well as HST and I have placed a note that pt needs to be scheduled for a f/u appt.  Nothing further needed.

## 2017-11-29 NOTE — Telephone Encounter (Signed)
Called and spoke with patient advised that we are waiting on results and will call her once we receive them.   VS please advise thank you.

## 2017-11-29 NOTE — Telephone Encounter (Signed)
Pt is calling back [phone]

## 2017-11-29 NOTE — Telephone Encounter (Signed)
Dr. Sood - please advise. Thanks. 

## 2017-11-29 NOTE — Telephone Encounter (Signed)
ONO with RA 11/02/17 >> test time 5 hrs 58 min.  Baseline SpO2 91%, low SpO2 85%.  Spent 6 min 18 sec with SpO2 < 88%.  PFT 11/04/17 >> FEV1 1.30 (84%), FEV1% 80, TLC 4.41 (106%), DLCO 70%, air trapping, could do post BD spirometry   Please let her know that her oxygen level is low at night.  She needs to have high resolution CT chest and home sleep study set up to further assess the cause of her low oxygen at night.  She needs to have ROV with me to discuss plan in more details.

## 2017-11-30 ENCOUNTER — Telehealth: Payer: Self-pay | Admitting: Pulmonary Disease

## 2017-11-30 NOTE — Telephone Encounter (Signed)
Message from VS this morning.. Can you call Ms. Ehrman to schedule her for an ROV before she has home sleep study.  She needs to have face to face visit prior to having home sleep study done.  I can fill you in on details about how this got set up.   Attempted to call patient today regarding above message from VS. I did not receive an answer at time of call. I have left a voicemail message for pt to return call. X1

## 2017-11-30 NOTE — Telephone Encounter (Signed)
Per last phone note:  Message from VS this morning.. Can you call Ms. Baack to schedule her for an ROV before she has home sleep study.  She needs to have face to face visit prior to having home sleep study done.  I can fill you in on details about how this got set up.   Attempted to call patient today regarding above message from VS. I did not receive an answer at time of call. I have left a voicemail message forptto return call. X1   Dr Halford Chessman- she has ov with you on 12/26/17- is this too late? Looks like she is scheduled to have ct chest on 12/09/17 Please advise thanks

## 2017-12-01 NOTE — Telephone Encounter (Signed)
Called and spoke to patient. She is having her sleep study on 12/07/2017.  She has been scheduled for an appointment for 12/05/2017 at 0930 with TN.  Nothing further needed at this time.

## 2017-12-01 NOTE — Telephone Encounter (Signed)
She needs to have face to face visit prior to having home sleep study.  Can you check if she is okay with coming for an appointment with one of the NPs sooner to complete face to face visit requirement prior to having home sleep study, and then she can keep her appointment with me on 12/26/17.

## 2017-12-01 NOTE — Telephone Encounter (Signed)
Patient is returning call, CB is [phone] or [phone].

## 2017-12-01 NOTE — Telephone Encounter (Signed)
Attempted to call pt. I did not receive an answer. I have left a message for pt to return our call.  

## 2017-12-01 NOTE — Telephone Encounter (Signed)
Spoke with pt to schedule ROV Scheduled for 12-05-17 with TN for her face to face ov Nothing further needed.

## 2017-12-05 ENCOUNTER — Ambulatory Visit (INDEPENDENT_AMBULATORY_CARE_PROVIDER_SITE_OTHER): Payer: Medicare Other | Admitting: Nurse Practitioner

## 2017-12-05 ENCOUNTER — Encounter: Payer: Self-pay | Admitting: Nurse Practitioner

## 2017-12-05 DIAGNOSIS — J449 Chronic obstructive pulmonary disease, unspecified: Secondary | ICD-10-CM | POA: Diagnosis not present

## 2017-12-05 NOTE — Progress Notes (Signed)
@Patient  ID: Kirsten [name], female    DOB: 1940/12/24, 77 y.o.   MRN: [id number]  Chief Complaint  Patient presents with  . Follow-up    Sleep study    Referring provider: Marton Redwood, MD  HPI 77 year old female never smoker with chronic obstructive asthma.    Pulmonary tests: CXR 07/24/12 >> no acute process PFT 07/26/12 >> FEV1 1.38 (81%), post-BD FEV1% 79, TLC 5.72 (113%), RV 3.01 (158%), DLCO 60%, +BD response PFT 09/18/15 >> FEV1 1.37 (85%), FEV1% 85, TLC 3.76 (90%), DLCO 63%, +BD, increased RV/TLC ratio ONO with RA 11/02/17>>test time 5 hours 58 mins. Baseline SpO2 91%, low SpO2 85%. Spent 110min 18 sec with SpO2 <88% PFT 11/04/17 >> FEV1 1.30 (84%), FEV1% 80, TLC 4.41 (106%), DLCO 70%, air trapping, could do post BD spirometry  Cardiac tests Echo 08/29/15 >> EF 60 to 65%, mild MR Wears dental device sleeps on her back. Has to sleep propped up. Goes to bed around 10:30-11:00. Wakes at 7:15 - 7:30. Does not complain of day time sleepiness. Does take part of an 1/3 Ambien to help sleep at night.  Echo 10/07/17>>EF 55 to 60%, grade 1 DD, mild AR, trivial MR, PAS 42 mmHg  OV 12/05/17 OV - face to face for sleep study Patient presents today to discuss results of recent ONO, echo, and PFT. Recent ono showed low O2 sats and echo showed elevated PA pressures. She will need sleep study scheduled and HRCT for further evaluation. She states that she has been doing well. She denies any snoring, but does live alone so she is not sure about this. She usually goes to bed around 10:00-11:00 and gets up around 7:00 am. She denies any shortness of breath, fever, or chest pain.    No Known Allergies  Immunization History  Administered Date(s) Administered  . Influenza Split 10/31/2012  . Influenza Whole 12/24/2011  . Influenza, High Dose Seasonal PF 03/27/2016  . Influenza,inj,Quad PF,6+ Mos 12/20/2013  . Pneumococcal Polysaccharide-23 02/23/2007    Past Medical History:  Diagnosis Date  .  Allergy    occasional seasonal  . Asthma   . COPD (chronic obstructive pulmonary disease) ([place])   . Emphysema of lung ([place])   . H/O: hysterectomy   . Heart murmur   . HTN (hypertension)   . HTN (hypertension)   . IGT (impaired glucose tolerance)   . Irregular heart rate    take Flecinade  . Osteopenia   . Palpitations   . Thyroid disease    hypothyroid  . Tinnitus   . Tremor, essential   . Vertigo     Tobacco History: Social History   Tobacco Use  Smoking Status Never Smoker  Smokeless Tobacco Never Used   Counseling given: Yes   Outpatient Encounter Medications as of 12/05/2017  Medication Sig  . albuterol (PROAIR HFA) 108 (90 BASE) MCG/ACT inhaler Inhale 2 puffs into the lungs every 6 (six) hours as needed for wheezing.  Marland Kitchen aspirin 81 MG tablet Take 81 mg by mouth daily.    . beclomethasone (QVAR) 80 MCG/ACT inhaler Inhale 2 puffs into the lungs 2 (two) times daily.  . Biotin 5000 MCG TABS Take 5,000 mcg by mouth daily. \  . CINNAMON PO Take 1,000 mg by mouth daily.    Marland Kitchen estrogens, conjugated, (PREMARIN) 0.9 MG tablet Take 0.9 mg by mouth as directed. Take daily for 21 days then do not take for 7 days.  . flecainide (TAMBOCOR) 50 MG tablet  Take 1 tablet (50 mg total) by mouth 2 (two) times daily. Take one by mouth twice daily and an additional if needed for heart rate  . Glucosamine-Chondroitin (OSTEO BI-FLEX REGULAR STRENGTH PO) Take 1 tablet by mouth daily.  . hydrochlorothiazide (HYDRODIURIL) 12.5 MG tablet Take 12.5 mg by mouth daily.   . Misc Natural Products (OSTEO BI-FLEX JOINT SHIELD PO) Take 1 capsule by mouth daily.   . rosuvastatin (CRESTOR) 10 MG tablet Take 1 tablet by mouth daily.  . Sodium Fluoride (CLINPRO 5000) 1.1 % PSTE Place onto teeth as directed.   Marland Kitchen SPIRIVA HANDIHALER 18 MCG inhalation capsule PLACE 1 CAPSULE INTO INHALER AND INHALE DAILY.  . SYNTHROID 75 MCG tablet Take 1 tablet by mouth daily.  . verapamil (CALAN-SR) 120 MG CR tablet TAKE 1  TABLET (120 MG TOTAL) BY MOUTH AT BEDTIME.  Marland Kitchen Vitamin D, Ergocalciferol, (DRISDOL) 50000 UNITS CAPS Take 50,000 Units by mouth. Twice a month   . zolpidem (AMBIEN) 10 MG tablet Take 10 mg by mouth at bedtime as needed for sleep.    No facility-administered encounter medications on file as of 12/05/2017.      Review of Systems  Review of Systems  Constitutional: Negative.  Negative for chills and fever.  HENT: Negative.  Negative for congestion.   Respiratory: Negative for cough, shortness of breath and wheezing.   Cardiovascular: Negative.  Negative for chest pain, palpitations and leg swelling.  Gastrointestinal: Negative.   Allergic/Immunologic: Negative.   Neurological: Negative.   Psychiatric/Behavioral: Negative.        Physical Exam  BP 130/68 (BP Location: Left Arm, Patient Position: Sitting, Cuff Size: Normal)   Pulse 80   Ht 5' (1.524 m)   Wt 136 lb 3.2 oz (61.8 kg)   SpO2 96%   BMI 26.60 kg/m   Wt Readings from Last 5 Encounters:  12/05/17 136 lb 3.2 oz (61.8 kg)  10/17/17 133 lb 12.8 oz (60.7 kg)  09/21/17 132 lb (59.9 kg)  04/23/16 131 lb (59.4 kg)  04/13/16 129 lb 9.6 oz (58.8 kg)     Physical Exam  Constitutional: She is oriented to person, place, and time. She appears well-developed and well-nourished. No distress.  Cardiovascular: Normal rate and regular rhythm.  Pulmonary/Chest: Effort normal and breath sounds normal. No respiratory distress. She has no wheezes. She has no rales.  Musculoskeletal: She exhibits no edema.  Neurological: She is alert and oriented to person, place, and time.  Psychiatric: She has a normal mood and affect.  Nursing note and vitals reviewed.     Assessment & Plan:   COPD with asthma Patient Instructions  Please keep appointments for CT scan and sleep study this week Considering elevated PA pressures on echo and decreased O2 sats on recent ONO patient needs HRCT and sleep study We will call with results Continue  current medications Follow up with Dr. Halford Chessman in 1-2 weeks for results or sooner if needed       Fenton Foy, NP 12/05/2017

## 2017-12-05 NOTE — Progress Notes (Signed)
Reviewed and agree with assessment/plan.    , MD [facility] 02/18/2016, 12:24 PM Pager:  [phone]  

## 2017-12-05 NOTE — Patient Instructions (Addendum)
Please keep appointments for CT scan and sleep study this week Considering elevated PA pressures on echo and decreased O2 sats on recent ONO patient needs HRCT and sleep study We will call with results Continue current medications Follow up with Dr. Halford Chessman in 1-2 weeks for results or sooner if needed

## 2017-12-05 NOTE — Assessment & Plan Note (Signed)
Patient Instructions  Please keep appointments for CT scan and sleep study this week Considering elevated PA pressures on echo and decreased O2 sats on recent ONO patient needs HRCT and sleep study We will call with results Continue current medications Follow up with Dr. Halford Chessman in 1-2 weeks for results or sooner if needed

## 2017-12-07 DIAGNOSIS — G4733 Obstructive sleep apnea (adult) (pediatric): Secondary | ICD-10-CM | POA: Diagnosis not present

## 2017-12-08 ENCOUNTER — Other Ambulatory Visit: Payer: Self-pay | Admitting: *Deleted

## 2017-12-08 ENCOUNTER — Encounter: Payer: Self-pay | Admitting: Pulmonary Disease

## 2017-12-08 ENCOUNTER — Telehealth: Payer: Self-pay | Admitting: Pulmonary Disease

## 2017-12-08 DIAGNOSIS — G4733 Obstructive sleep apnea (adult) (pediatric): Secondary | ICD-10-CM

## 2017-12-08 DIAGNOSIS — R0602 Shortness of breath: Secondary | ICD-10-CM

## 2017-12-08 DIAGNOSIS — G4734 Idiopathic sleep related nonobstructive alveolar hypoventilation: Secondary | ICD-10-CM

## 2017-12-08 HISTORY — DX: Obstructive sleep apnea (adult) (pediatric): G47.33

## 2017-12-08 NOTE — Telephone Encounter (Signed)
Called and spoke with patient regarding results.  Informed the patient of results and recommendations today. Pt has ROV on 12/26/2017 with VS Pt verbalized understanding and denied any questions or concerns at this time.  Nothing further needed.

## 2017-12-08 NOTE — Telephone Encounter (Signed)
HST 12/07/17 >> AHI 12.3, SpO2 low 78%.   Please let her know that the sleep study showed mild obstructive sleep apnea.  Will discuss in more detail at [facility] on 12/26/17.

## 2017-12-09 ENCOUNTER — Ambulatory Visit (INDEPENDENT_AMBULATORY_CARE_PROVIDER_SITE_OTHER)
Admission: RE | Admit: 2017-12-09 | Discharge: 2017-12-09 | Disposition: A | Discharge status: Home / Self Care | Payer: Medicare Other | Source: Ambulatory Visit | Attending: Pulmonary Disease | Admitting: Pulmonary Disease

## 2017-12-09 DIAGNOSIS — R0602 Shortness of breath: Secondary | ICD-10-CM | POA: Diagnosis not present

## 2017-12-09 DIAGNOSIS — J479 Bronchiectasis, uncomplicated: Secondary | ICD-10-CM | POA: Diagnosis not present

## 2017-12-26 ENCOUNTER — Encounter: Payer: Self-pay | Admitting: Pulmonary Disease

## 2017-12-26 ENCOUNTER — Ambulatory Visit (INDEPENDENT_AMBULATORY_CARE_PROVIDER_SITE_OTHER): Payer: Medicare Other | Admitting: Pulmonary Disease

## 2017-12-26 ENCOUNTER — Telehealth: Payer: Self-pay | Admitting: Pulmonary Disease

## 2017-12-26 ENCOUNTER — Other Ambulatory Visit: Payer: Self-pay

## 2017-12-26 VITALS — BP 124/70 | HR 55 | Ht 60.0 in | Wt 135.0 lb

## 2017-12-26 DIAGNOSIS — G4733 Obstructive sleep apnea (adult) (pediatric): Secondary | ICD-10-CM

## 2017-12-26 DIAGNOSIS — I272 Pulmonary hypertension, unspecified: Secondary | ICD-10-CM | POA: Diagnosis not present

## 2017-12-26 DIAGNOSIS — J42 Unspecified chronic bronchitis: Secondary | ICD-10-CM

## 2017-12-26 MED ORDER — BECLOMETHASONE DIPROPIONATE 80 MCG/ACT IN AERS: 2.0000 | INHALATION_SPRAY | Freq: Two times a day (BID) | RESPIRATORY_TRACT | 5 refills | Status: DC

## 2017-12-26 MED ORDER — TIOTROPIUM BROMIDE MONOHYDRATE 18 MCG IN CAPS: ORAL_CAPSULE | RESPIRATORY_TRACT | 4 refills | Status: AC

## 2017-12-26 NOTE — Progress Notes (Signed)
[facility]  Chief Complaint  Patient presents with  . Follow-up    Pt here to go over results of prior testing. Pt has SOB-baseline, increase productive cough-clear.    Constitutional:  BP 124/70 (BP Location: Left Arm, Cuff Size: Normal)   Pulse (!) 55   Ht 5' (1.524 m)   Wt 135 lb (61.2 kg)   SpO2 95%   BMI 26.37 kg/m   Past Medical History:  HTN, Glucose intolerance, Palpitations, Osteopenia, Tremor, Tinnitus, Vertigo  Brief Summary:  Kirsten [name] is a 77 y.o. female with dyspnea.  She is here to review tests.  Echo showed elevated PA pressures.  ONO showed low oxygen.  HST showed mild sleep apnea.  CT chest showed changes suggestive of constrictive bronchiolitis.  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  She is not having chest pain, or palpitations.  Still gets short of breath and has a cough with clear sputum.  Feels like inhalers help.   Physical Exam:   Appearance - well kempt   ENMT - clear nasal mucosa, midline nasal  septum, no oral exudates, no LAN, trachea midline  Respiratory - normal chest wall, normal respiratory effort, no accessory muscle use, no wheeze/rales  CV - s1s2 regular rate and rhythm, no murmurs, no peripheral edema, radial pulses symmetric  GI - soft, non tender, no masses  Lymph - no adenopathy noted in neck and axillary areas  MSK - normal gait  Ext - no cyanosis, clubbing, or joint inflammation noted  Skin - no rashes, lesions, or ulcers  Neuro - normal strength, oriented x 3  Psych - normal mood and affect   Assessment/Plan:   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 - discussed her sleep study results - reviewed treatment options - she will proceed with CPAP set  up - she will arrange for a new dentist and then determine if she would like to try an oral appliance  Coronary calcifications on CT chest. - she will f/u with cardiology   Patient Instructions  Will arrange for auto CPAP machine  Dr. Augustina Mood is a dentist who specializes in making oral appliances for obstructive sleep apnea  Follow up in 2 months after getting CPAP machine  Time spent 31 minutes  [name], MD [place] Pager: [phone] 12/26/2017, 9:11 AM  Flow Sheet     Pulmonary tests:  PFT 07/26/12 >> FEV1 1.38 (81%), post-BD FEV1% 79, TLC 5.72 (113%), RV 3.01 (158%), DLCO 60%, +BD response PFT 09/18/15 >> FEV1 1.37 (85%), FEV1% 85, TLC 3.76 (90%), DLCO 63%, +BD, increased RV/TLC ratio PFT 11/04/17 >> FEV1 1.30 (84%), FEV1% 80, TLC 4.41 (106%), DLCO 70%, air trapping, could do post BD spirometry HRCT chest 12/10/17 >> atherosclerosis, patchy air trapping b/l, scattered minimal cylindrical BTX b/l  Sleep tests:  ONO with RA 11/02/17 >> test time 5 hrs 58 min.  Baseline SpO2 91%, low SpO2 85%.  Spent 6 min 18 sec with SpO2 < 88%. HST 12/07/17 >> AHI 12.3, SpO2 low 78%.  Cardiac tests:  Echo 10/17/17 >> EF 55 to 60%, grade 1 DD, mild AR, trivial MR, PAS 42 mmHg   Medications:   Allergies as of 12/26/2017   No Known Allergies  Medication List        Accurate as of 12/26/17  9:11 AM. Always use your most recent med list.          aspirin 81 MG tablet Take 81 mg by mouth daily.   beclomethasone 80 MCG/ACT inhaler Commonly known as:  QVAR Inhale 2 puffs into the lungs 2 (two) times daily.   Biotin 5000 MCG Tabs Take 5,000 mcg by mouth daily. \   CINNAMON PO Take 1,000 mg by mouth daily.   CLINPRO 5000 1.1 % Pste Generic drug:  Sodium Fluoride Place onto teeth as directed.   estrogens (conjugated) 0.9 MG tablet Commonly known as:  PREMARIN Take 0.9 mg by mouth as directed. Take daily for 21 days then do not take for 7  days.   flecainide 50 MG tablet Commonly known as:  TAMBOCOR Take 1 tablet (50 mg total) by mouth 2 (two) times daily. Take one by mouth twice daily and an additional if needed for heart rate   hydrochlorothiazide 12.5 MG tablet Commonly known as:  HYDRODIURIL Take 12.5 mg by mouth daily.   OSTEO BI-FLEX JOINT SHIELD PO Take 1 capsule by mouth daily.   OSTEO BI-FLEX REGULAR STRENGTH PO Take 1 tablet by mouth daily.   PROAIR HFA 108 (90 Base) MCG/ACT inhaler Generic drug:  albuterol Inhale 2 puffs into the lungs every 6 (six) hours as needed for wheezing.   rosuvastatin 10 MG tablet Commonly known as:  CRESTOR Take 1 tablet by mouth daily.   SPIRIVA HANDIHALER 18 MCG inhalation capsule Generic drug:  tiotropium PLACE 1 CAPSULE INTO INHALER AND INHALE DAILY.   SYNTHROID 75 MCG tablet Generic drug:  levothyroxine Take 1 tablet by mouth daily.   verapamil 120 MG CR tablet Commonly known as:  CALAN-SR TAKE 1 TABLET (120 MG TOTAL) BY MOUTH AT BEDTIME.   Vitamin D (Ergocalciferol) 50000 units Caps capsule Commonly known as:  DRISDOL Take 50,000 Units by mouth. Twice a month   zolpidem 10 MG tablet Commonly known as:  AMBIEN Take 10 mg by mouth at bedtime as needed for sleep.       Past Surgical History:  She  has a past surgical history that includes orthopedic procedure; Cataract extraction; and Trigger finger release.  Family History:  Her family history includes Cirrhosis in her mother; Heart disease in her maternal grandmother; Kidney disease in her mother; Lung cancer in her father.  Social History:  She  reports that she has never smoked. She has never used smokeless tobacco. She reports that she does not drink alcohol or use drugs.

## 2017-12-26 NOTE — Telephone Encounter (Signed)
Called pharmacy, they are closed for lunch. Left message to give [place] a call back.

## 2017-12-26 NOTE — Patient Instructions (Signed)
Will arrange for auto CPAP machine  Dr. Augustina Mood is a dentist who specializes in making oral appliances for obstructive sleep apnea  Follow up in 2 months after getting CPAP machine

## 2017-12-27 NOTE — Telephone Encounter (Signed)
lmtcb x2 for pt. 

## 2017-12-29 MED ORDER — BECLOMETHASONE DIPROP HFA 40 MCG/ACT IN AERB: 2.0000 | INHALATION_SPRAY | Freq: Two times a day (BID) | RESPIRATORY_TRACT | 5 refills | Status: DC

## 2017-12-29 NOTE — Telephone Encounter (Signed)
VS can we switch the patient from the regular Qvar to [facility]? Please advise.   beclomethasone (QVAR) 80 MCG/ACT inhaler [[id number]]   Order Details  Dose: 2 puff Route: Inhalation Frequency: 2 times daily  Dispense Quantity: 1 Inhaler Refills: 5 Fills remaining: --        Sig: Inhale 2 puffs into the lungs 2 (two) times daily.       Written Date: 12/26/17 Expiration Date: 12/26/18    Start Date: 12/26/17 End Date: --         Ordering Provider: [name], MD DEA #:  [id number] NPI:  [id number]

## 2017-12-29 NOTE — Telephone Encounter (Signed)
Called and spoke with Kirsten [name] at [facility]. Script has been received. Nothing further is needed at this time.

## 2017-12-29 NOTE — Telephone Encounter (Signed)
Okay to switch to Qvar redihaler 40 mcg two puffs bid.

## 2018-01-23 ENCOUNTER — Telehealth: Payer: Self-pay | Admitting: Pulmonary Disease

## 2018-01-23 NOTE — Telephone Encounter (Signed)
Spoke with Kirsten [name] she has now received the fax and nothing further is needed at this time.

## 2018-02-16 DIAGNOSIS — Z1239 Encounter for other screening for malignant neoplasm of breast: Secondary | ICD-10-CM | POA: Diagnosis not present

## 2018-02-16 DIAGNOSIS — J449 Chronic obstructive pulmonary disease, unspecified: Secondary | ICD-10-CM | POA: Diagnosis not present

## 2018-02-23 ENCOUNTER — Other Ambulatory Visit: Payer: Self-pay | Admitting: Internal Medicine

## 2018-02-23 DIAGNOSIS — Z1231 Encounter for screening mammogram for malignant neoplasm of breast: Secondary | ICD-10-CM

## 2018-02-28 ENCOUNTER — Ambulatory Visit: Payer: Medicare Other | Admitting: Pulmonary Disease

## 2018-03-14 ENCOUNTER — Ambulatory Visit: Payer: Medicare Other | Admitting: Pulmonary Disease

## 2018-04-04 DIAGNOSIS — D225 Melanocytic nevi of trunk: Secondary | ICD-10-CM | POA: Diagnosis not present

## 2018-04-04 DIAGNOSIS — L814 Other melanin hyperpigmentation: Secondary | ICD-10-CM | POA: Diagnosis not present

## 2018-04-04 DIAGNOSIS — D1801 Hemangioma of skin and subcutaneous tissue: Secondary | ICD-10-CM | POA: Diagnosis not present

## 2018-05-01 ENCOUNTER — Ambulatory Visit
Admission: RE | Admit: 2018-05-01 | Discharge: 2018-05-01 | Disposition: A | Discharge status: Home / Self Care | Payer: Medicare Other | Source: Ambulatory Visit | Attending: Internal Medicine | Admitting: Internal Medicine

## 2018-05-01 DIAGNOSIS — Z1231 Encounter for screening mammogram for malignant neoplasm of breast: Secondary | ICD-10-CM

## 2018-06-12 DIAGNOSIS — I499 Cardiac arrhythmia, unspecified: Secondary | ICD-10-CM | POA: Diagnosis not present

## 2018-06-12 DIAGNOSIS — E039 Hypothyroidism, unspecified: Secondary | ICD-10-CM | POA: Diagnosis not present

## 2018-06-12 DIAGNOSIS — J449 Chronic obstructive pulmonary disease, unspecified: Secondary | ICD-10-CM | POA: Diagnosis not present

## 2018-06-12 DIAGNOSIS — I272 Pulmonary hypertension, unspecified: Secondary | ICD-10-CM | POA: Diagnosis not present

## 2018-06-12 DIAGNOSIS — R7302 Impaired glucose tolerance (oral): Secondary | ICD-10-CM | POA: Diagnosis not present

## 2018-06-12 DIAGNOSIS — I10 Essential (primary) hypertension: Secondary | ICD-10-CM | POA: Diagnosis not present

## 2018-06-12 DIAGNOSIS — E785 Hyperlipidemia, unspecified: Secondary | ICD-10-CM | POA: Diagnosis not present

## 2018-06-12 DIAGNOSIS — G4733 Obstructive sleep apnea (adult) (pediatric): Secondary | ICD-10-CM | POA: Diagnosis not present

## 2018-06-12 DIAGNOSIS — E663 Overweight: Secondary | ICD-10-CM | POA: Diagnosis not present

## 2018-08-01 DIAGNOSIS — L638 Other alopecia areata: Secondary | ICD-10-CM | POA: Diagnosis not present

## 2018-09-04 DIAGNOSIS — L638 Other alopecia areata: Secondary | ICD-10-CM | POA: Diagnosis not present

## 2018-10-09 DIAGNOSIS — L638 Other alopecia areata: Secondary | ICD-10-CM | POA: Diagnosis not present

## 2018-10-21 ENCOUNTER — Other Ambulatory Visit: Payer: Self-pay | Admitting: Internal Medicine

## 2018-10-21 DIAGNOSIS — I493 Ventricular premature depolarization: Secondary | ICD-10-CM

## 2018-11-02 DIAGNOSIS — H16141 Punctate keratitis, right eye: Secondary | ICD-10-CM | POA: Diagnosis not present

## 2018-11-03 DIAGNOSIS — M859 Disorder of bone density and structure, unspecified: Secondary | ICD-10-CM | POA: Diagnosis not present

## 2018-11-03 DIAGNOSIS — E038 Other specified hypothyroidism: Secondary | ICD-10-CM | POA: Diagnosis not present

## 2018-11-03 DIAGNOSIS — Z23 Encounter for immunization: Secondary | ICD-10-CM | POA: Diagnosis not present

## 2018-11-03 DIAGNOSIS — E7849 Other hyperlipidemia: Secondary | ICD-10-CM | POA: Diagnosis not present

## 2018-11-03 DIAGNOSIS — R7302 Impaired glucose tolerance (oral): Secondary | ICD-10-CM | POA: Diagnosis not present

## 2018-11-06 DIAGNOSIS — I10 Essential (primary) hypertension: Secondary | ICD-10-CM | POA: Diagnosis not present

## 2018-11-06 DIAGNOSIS — R82998 Other abnormal findings in urine: Secondary | ICD-10-CM | POA: Diagnosis not present

## 2018-11-09 DIAGNOSIS — E039 Hypothyroidism, unspecified: Secondary | ICD-10-CM | POA: Diagnosis not present

## 2018-11-09 DIAGNOSIS — Z7189 Other specified counseling: Secondary | ICD-10-CM | POA: Diagnosis not present

## 2018-11-09 DIAGNOSIS — R7302 Impaired glucose tolerance (oral): Secondary | ICD-10-CM | POA: Diagnosis not present

## 2018-11-09 DIAGNOSIS — I2729 Other secondary pulmonary hypertension: Secondary | ICD-10-CM | POA: Diagnosis not present

## 2018-11-09 DIAGNOSIS — M859 Disorder of bone density and structure, unspecified: Secondary | ICD-10-CM | POA: Diagnosis not present

## 2018-11-09 DIAGNOSIS — E663 Overweight: Secondary | ICD-10-CM | POA: Diagnosis not present

## 2018-11-09 DIAGNOSIS — Z1331 Encounter for screening for depression: Secondary | ICD-10-CM | POA: Diagnosis not present

## 2018-11-09 DIAGNOSIS — Z0000 Encounter for general adult medical examination without abnormal findings: Secondary | ICD-10-CM | POA: Diagnosis not present

## 2018-11-09 DIAGNOSIS — J449 Chronic obstructive pulmonary disease, unspecified: Secondary | ICD-10-CM | POA: Diagnosis not present

## 2018-11-09 DIAGNOSIS — G4733 Obstructive sleep apnea (adult) (pediatric): Secondary | ICD-10-CM | POA: Diagnosis not present

## 2018-11-09 DIAGNOSIS — E7849 Other hyperlipidemia: Secondary | ICD-10-CM | POA: Diagnosis not present

## 2018-11-09 DIAGNOSIS — Z1339 Encounter for screening examination for other mental health and behavioral disorders: Secondary | ICD-10-CM | POA: Diagnosis not present

## 2018-11-09 DIAGNOSIS — I10 Essential (primary) hypertension: Secondary | ICD-10-CM | POA: Diagnosis not present

## 2018-11-09 DIAGNOSIS — G250 Essential tremor: Secondary | ICD-10-CM | POA: Diagnosis not present

## 2018-11-10 DIAGNOSIS — Z1212 Encounter for screening for malignant neoplasm of rectum: Secondary | ICD-10-CM | POA: Diagnosis not present

## 2018-11-13 DIAGNOSIS — L638 Other alopecia areata: Secondary | ICD-10-CM | POA: Diagnosis not present

## 2018-11-21 DIAGNOSIS — M65331 Trigger finger, right middle finger: Secondary | ICD-10-CM | POA: Insufficient documentation

## 2018-11-23 DIAGNOSIS — M65331 Trigger finger, right middle finger: Secondary | ICD-10-CM | POA: Diagnosis not present

## 2018-12-11 DIAGNOSIS — L638 Other alopecia areata: Secondary | ICD-10-CM | POA: Diagnosis not present

## 2018-12-18 ENCOUNTER — Other Ambulatory Visit: Payer: Self-pay | Admitting: Internal Medicine

## 2018-12-18 DIAGNOSIS — I493 Ventricular premature depolarization: Secondary | ICD-10-CM

## 2018-12-19 ENCOUNTER — Other Ambulatory Visit: Payer: Self-pay | Admitting: Internal Medicine

## 2018-12-19 DIAGNOSIS — I493 Ventricular premature depolarization: Secondary | ICD-10-CM

## 2018-12-19 MED ORDER — FLECAINIDE ACETATE 50 MG PO TABS: 50.0000 mg | ORAL_TABLET | Freq: Two times a day (BID) | ORAL | 0 refills | Status: DC

## 2018-12-19 NOTE — Telephone Encounter (Signed)
Pt's medication was sent to pt's pharmacy as requested. Confirmation received.  °

## 2019-01-12 ENCOUNTER — Other Ambulatory Visit: Payer: Self-pay | Admitting: Internal Medicine

## 2019-01-12 DIAGNOSIS — I493 Ventricular premature depolarization: Secondary | ICD-10-CM

## 2019-01-25 ENCOUNTER — Encounter: Payer: Self-pay | Admitting: Internal Medicine

## 2019-01-25 ENCOUNTER — Encounter (INDEPENDENT_AMBULATORY_CARE_PROVIDER_SITE_OTHER): Payer: Self-pay

## 2019-01-25 ENCOUNTER — Other Ambulatory Visit: Payer: Self-pay

## 2019-01-25 ENCOUNTER — Ambulatory Visit (INDEPENDENT_AMBULATORY_CARE_PROVIDER_SITE_OTHER): Payer: Medicare Other | Admitting: Internal Medicine

## 2019-01-25 VITALS — BP 132/68 | HR 97 | Ht 60.0 in | Wt 143.8 lb

## 2019-01-25 DIAGNOSIS — R002 Palpitations: Secondary | ICD-10-CM

## 2019-01-25 DIAGNOSIS — J449 Chronic obstructive pulmonary disease, unspecified: Secondary | ICD-10-CM | POA: Diagnosis not present

## 2019-01-25 NOTE — Progress Notes (Signed)
HPI Kirsten [name] returns today for ongoing evaluation of palpitations and PVC's. She has a h/o HTN. She has struggled to lose weight in the time of COVID.  No Known Allergies   Current Outpatient Medications  Medication Sig Dispense Refill  . albuterol (PROAIR HFA) 108 (90 BASE) MCG/ACT inhaler Inhale 2 puffs into the lungs every 6 (six) hours as needed for wheezing.    Marland Kitchen aspirin 81 MG tablet Take 81 mg by mouth daily.      . beclomethasone (QVAR REDIHALER) 40 MCG/ACT inhaler Inhale 2 puffs into the lungs 2 (two) times daily. 1 Inhaler 5  . beclomethasone (QVAR) 80 MCG/ACT inhaler Inhale 2 puffs into the lungs 2 (two) times daily. 1 Inhaler 5  . Biotin 5000 MCG TABS Take 5,000 mcg by mouth daily. \    . CINNAMON PO Take 1,000 mg by mouth daily.      Marland Kitchen estrogens, conjugated, (PREMARIN) 0.9 MG tablet Take 0.9 mg by mouth as directed. Take daily for 21 days then do not take for 7 days.    . flecainide (TAMBOCOR) 50 MG tablet Take 1 tablet (50 mg total) by mouth 2 (two) times daily. Take one by mouth twice daily and an additional if needed for heart rate 180 tablet 0  . Glucosamine-Chondroitin (OSTEO BI-FLEX REGULAR STRENGTH PO) Take 1 tablet by mouth daily.    . Misc Natural Products (OSTEO BI-FLEX JOINT SHIELD PO) Take 1 capsule by mouth daily.     . rosuvastatin (CRESTOR) 10 MG tablet Take 1 tablet by mouth daily.    . Sodium Fluoride (CLINPRO 5000) 1.1 % PSTE Place onto teeth as directed.     Marland Kitchen SYNTHROID 75 MCG tablet Take 1 tablet by mouth daily.    Marland Kitchen tiotropium (SPIRIVA HANDIHALER) 18 MCG inhalation capsule PLACE 1 CAPSULE INTO INHALER AND INHALE DAILY. 90 capsule 4  . verapamil (CALAN-SR) 120 MG CR tablet TAKE ONE TABLET BY MOUTH EVERY NIGHT AT BEDTIME. 90 tablet 0  . Vitamin D, Ergocalciferol, (DRISDOL) 50000 UNITS CAPS Take 50,000 Units by mouth. Twice a month     . zolpidem (AMBIEN) 10 MG tablet Take 10 mg by mouth at bedtime as needed for sleep.     . hydrochlorothiazide  (HYDRODIURIL) 25 MG tablet Take 1 tablet by mouth daily.     No current facility-administered medications for this visit.      Past Medical History:  Diagnosis Date  . Allergy    occasional seasonal  . Asthma   . COPD (chronic obstructive pulmonary disease) ([place])   . Emphysema of lung ([place])   . H/O: hysterectomy   . Heart murmur   . HTN (hypertension)   . HTN (hypertension)   . IGT (impaired glucose tolerance)   . Irregular heart rate    take Flecinade  . OSA (obstructive sleep apnea) 12/08/2017  . Osteopenia   . Palpitations   . Thyroid disease    hypothyroid  . Tinnitus   . Tremor, essential   . Vertigo     ROS:   All systems reviewed and negative except as noted in the HPI.   Past Surgical History:  Procedure Laterality Date  . CATARACT EXTRACTION     both eyes- February 2015  . orthopedic procedure     both feet  . TRIGGER FINGER RELEASE     right hand     Family History  Problem Relation Age of Onset  . Kidney disease Mother   .  Cirrhosis Mother   . Lung cancer Father   . Heart disease Maternal Grandmother   . Colon cancer Neg Hx   . Esophageal cancer Neg Hx   . Rectal cancer Neg Hx   . Stomach cancer Neg Hx   . Pancreatic cancer Neg Hx      Social History   Socioeconomic History  . Marital status: Divorced    Spouse name: Not on file  . Number of children: 0  . Years of education: Not on file  . Highest education level: Not on file  Occupational History  . Occupation: retired Estate agent for [employer]  . Financial resource strain: Not on file  . Food insecurity    Worry: Not on file    Inability: Not on file  . Transportation needs    Medical: Not on file    Non-medical: Not on file  Tobacco Use  . Smoking status: Never Smoker  . Smokeless tobacco: Never Used  Substance and Sexual Activity  . Alcohol use: No  . Drug use: No  . Sexual activity: Not on file  Lifestyle  . Physical activity    Days per  week: Not on file    Minutes per session: Not on file  . Stress: Not on file  Relationships  . Social Herbalist on phone: Not on file    Gets together: Not on file    Attends religious service: Not on file    Active member of club or organization: Not on file    Attends meetings of clubs or organizations: Not on file    Relationship status: Not on file  . Intimate partner violence    Fear of current or ex partner: Not on file    Emotionally abused: Not on file    Physically abused: Not on file    Forced sexual activity: Not on file  Other Topics Concern  . Not on file  Social History Narrative  . Not on file     BP 132/68   Pulse 97   Ht 5' (1.524 m)   Wt 143 lb 12.8 oz (65.2 kg)   SpO2 97%   BMI 28.08 kg/m   Physical Exam:  Well appearing NAD HEENT: Unremarkable Neck:  No JVD, no thyromegally Lymphatics:  No adenopathy Back:  No CVA tenderness Lungs:  Clear with no wheezes HEART:  Regular rate rhythm, no murmurs, no rubs, no clicks Abd:  soft, positive bowel sounds, no organomegally, no rebound, no guarding Ext:  2 plus pulses, no edema, no cyanosis, no clubbing Skin:  No rashes no nodules Neuro:  CN II through XII intact, motor grossly intact  EKG - NSR  Assess/Plan: 1. PVC's - her symptoms are well controlled. She will continue flecainide. 2. Obesity - she has gained weight since her last visit and I have asked her to lose 12 lbs.  3. HTN - she will continue her verapamil. No change. 4. Dyslipidemia - she will continue crestor. She is encouraged to lose weight.  Mikle Bosworth.D.

## 2019-01-25 NOTE — Patient Instructions (Signed)

## 2019-02-27 DIAGNOSIS — Z1239 Encounter for other screening for malignant neoplasm of breast: Secondary | ICD-10-CM | POA: Diagnosis not present

## 2019-02-27 DIAGNOSIS — Z780 Asymptomatic menopausal state: Secondary | ICD-10-CM | POA: Diagnosis not present

## 2019-02-27 DIAGNOSIS — R634 Abnormal weight loss: Secondary | ICD-10-CM | POA: Diagnosis not present

## 2019-03-16 ENCOUNTER — Other Ambulatory Visit: Payer: Self-pay | Admitting: Internal Medicine

## 2019-03-16 DIAGNOSIS — I493 Ventricular premature depolarization: Secondary | ICD-10-CM

## 2019-04-13 DIAGNOSIS — M65331 Trigger finger, right middle finger: Secondary | ICD-10-CM | POA: Diagnosis not present

## 2019-04-18 ENCOUNTER — Other Ambulatory Visit: Payer: Self-pay | Admitting: Internal Medicine

## 2019-04-18 DIAGNOSIS — I493 Ventricular premature depolarization: Secondary | ICD-10-CM

## 2019-05-10 DIAGNOSIS — I700 Atherosclerosis of aorta: Secondary | ICD-10-CM | POA: Insufficient documentation

## 2019-05-10 DIAGNOSIS — E663 Overweight: Secondary | ICD-10-CM | POA: Diagnosis not present

## 2019-05-10 DIAGNOSIS — E785 Hyperlipidemia, unspecified: Secondary | ICD-10-CM | POA: Diagnosis not present

## 2019-05-10 DIAGNOSIS — E039 Hypothyroidism, unspecified: Secondary | ICD-10-CM | POA: Diagnosis not present

## 2019-05-10 DIAGNOSIS — E559 Vitamin D deficiency, unspecified: Secondary | ICD-10-CM | POA: Diagnosis not present

## 2019-05-10 DIAGNOSIS — J449 Chronic obstructive pulmonary disease, unspecified: Secondary | ICD-10-CM | POA: Diagnosis not present

## 2019-05-10 DIAGNOSIS — M8580 Other specified disorders of bone density and structure, unspecified site: Secondary | ICD-10-CM | POA: Diagnosis not present

## 2019-05-10 DIAGNOSIS — R7302 Impaired glucose tolerance (oral): Secondary | ICD-10-CM | POA: Diagnosis not present

## 2019-05-10 DIAGNOSIS — G4733 Obstructive sleep apnea (adult) (pediatric): Secondary | ICD-10-CM | POA: Diagnosis not present

## 2019-05-10 DIAGNOSIS — I10 Essential (primary) hypertension: Secondary | ICD-10-CM | POA: Diagnosis not present

## 2019-07-04 DIAGNOSIS — L300 Nummular dermatitis: Secondary | ICD-10-CM | POA: Diagnosis not present

## 2019-07-04 DIAGNOSIS — L817 Pigmented purpuric dermatosis: Secondary | ICD-10-CM | POA: Diagnosis not present

## 2019-07-04 DIAGNOSIS — D692 Other nonthrombocytopenic purpura: Secondary | ICD-10-CM | POA: Diagnosis not present

## 2019-07-04 DIAGNOSIS — D225 Melanocytic nevi of trunk: Secondary | ICD-10-CM | POA: Diagnosis not present

## 2019-07-04 DIAGNOSIS — D1801 Hemangioma of skin and subcutaneous tissue: Secondary | ICD-10-CM | POA: Diagnosis not present

## 2019-07-04 DIAGNOSIS — L814 Other melanin hyperpigmentation: Secondary | ICD-10-CM | POA: Diagnosis not present

## 2019-08-28 ENCOUNTER — Encounter: Payer: Self-pay | Admitting: Podiatry

## 2019-08-28 ENCOUNTER — Ambulatory Visit (INDEPENDENT_AMBULATORY_CARE_PROVIDER_SITE_OTHER): Payer: Medicare Other

## 2019-08-28 ENCOUNTER — Other Ambulatory Visit: Payer: Self-pay | Admitting: Internal Medicine

## 2019-08-28 ENCOUNTER — Other Ambulatory Visit: Payer: Self-pay

## 2019-08-28 ENCOUNTER — Ambulatory Visit (INDEPENDENT_AMBULATORY_CARE_PROVIDER_SITE_OTHER): Payer: Medicare Other | Admitting: Podiatry

## 2019-08-28 ENCOUNTER — Other Ambulatory Visit: Payer: Self-pay | Admitting: Podiatry

## 2019-08-28 DIAGNOSIS — M84375A Stress fracture, left foot, initial encounter for fracture: Secondary | ICD-10-CM | POA: Diagnosis not present

## 2019-08-28 DIAGNOSIS — Z1231 Encounter for screening mammogram for malignant neoplasm of breast: Secondary | ICD-10-CM

## 2019-08-28 DIAGNOSIS — M778 Other enthesopathies, not elsewhere classified: Secondary | ICD-10-CM

## 2019-08-28 NOTE — Progress Notes (Signed)
Subjective:  Patient ID: Kirsten [name], female    DOB: [birth year],  MRN: [id number] HPI Chief Complaint  Patient presents with  . Foot Pain    Plantar forefoot left - aching x 6 weeks, "feels like walking on a biscuit", tried Aleve/Tylenol  . New Patient (Initial Visit)    79 y.o. female presents with the above complaint.   ROS: Denies fever chills nausea vomiting muscle aches pains calf pain back pain chest pain shortness of breath.  Past Medical History:  Diagnosis Date  . Allergy    occasional seasonal  . Asthma   . COPD (chronic obstructive pulmonary disease) ([place])   . Emphysema of lung ([place])   . H/O: hysterectomy   . Heart murmur   . HTN (hypertension)   . HTN (hypertension)   . IGT (impaired glucose tolerance)   . Irregular heart rate    take Flecinade  . OSA (obstructive sleep apnea) 12/08/2017  . Osteopenia   . Palpitations   . Thyroid disease    hypothyroid  . Tinnitus   . Tremor, essential   . Vertigo    Past Surgical History:  Procedure Laterality Date  . CATARACT EXTRACTION     both eyes- February 2015  . orthopedic procedure     both feet  . TRIGGER FINGER RELEASE     right hand    Current Outpatient Medications:  .  albuterol (PROAIR HFA) 108 (90 BASE) MCG/ACT inhaler, Inhale 2 puffs into the lungs every 6 (six) hours as needed for wheezing., Disp: , Rfl:  .  aspirin 81 MG tablet, Take 81 mg by mouth daily.  , Disp: , Rfl:  .  beclomethasone (QVAR REDIHALER) 40 MCG/ACT inhaler, Inhale 2 puffs into the lungs 2 (two) times daily., Disp: 1 Inhaler, Rfl: 5 .  beclomethasone (QVAR) 80 MCG/ACT inhaler, Inhale 2 puffs into the lungs 2 (two) times daily., Disp: 1 Inhaler, Rfl: 5 .  Biotin 5000 MCG TABS, Take 5,000 mcg by mouth daily. \, Disp: , Rfl:  .  CINNAMON PO, Take 1,000 mg by mouth daily.  , Disp: , Rfl:  .  estrogens, conjugated, (PREMARIN) 0.9 MG tablet, Take 0.9 mg by mouth as directed. Take daily for 21 days then do not take for 7 days., Disp: ,  Rfl:  .  flecainide (TAMBOCOR) 50 MG tablet, TAKE ONE TABLET BY MOUTH TWICE A DAY. TAKE ONE ADDITIONAL TABLET IF NEEDED FOR HEART RATE, Disp: 180 tablet, Rfl: 3 .  Glucosamine-Chondroitin (OSTEO BI-FLEX REGULAR STRENGTH PO), Take 1 tablet by mouth daily., Disp: , Rfl:  .  hydrochlorothiazide (HYDRODIURIL) 25 MG tablet, Take 1 tablet by mouth daily., Disp: , Rfl:  .  Misc Natural Products (OSTEO BI-FLEX JOINT SHIELD PO), Take 1 capsule by mouth daily. , Disp: , Rfl:  .  rosuvastatin (CRESTOR) 10 MG tablet, Take 1 tablet by mouth daily., Disp: , Rfl:  .  Sodium Fluoride (CLINPRO 5000) 1.1 % PSTE, Place onto teeth as directed. , Disp: , Rfl:  .  SYNTHROID 75 MCG tablet, Take 1 tablet by mouth daily., Disp: , Rfl:  .  tiotropium (SPIRIVA HANDIHALER) 18 MCG inhalation capsule, PLACE 1 CAPSULE INTO INHALER AND INHALE DAILY., Disp: 90 capsule, Rfl: 4 .  verapamil (CALAN-SR) 120 MG CR tablet, TAKE ONE TABLET BY MOUTH EVERY NIGHT AT BEDTIME, Disp: 90 tablet, Rfl: 2 .  Vitamin D, Ergocalciferol, (DRISDOL) 50000 UNITS CAPS, Take 50,000 Units by mouth. Twice a month , Disp: , Rfl:  .  zolpidem (AMBIEN) 10 MG tablet, Take 10 mg by mouth at bedtime as needed for sleep. , Disp: , Rfl:   No Known Allergies Review of Systems Objective:  There were no vitals filed for this visit.  General: Well developed, nourished, in no acute distress, alert and oriented x3   Dermatological: Skin is warm, dry and supple bilateral. Nails x 10 are well maintained; remaining integument appears unremarkable at this time. There are no open sores, no preulcerative lesions, no rash or signs of infection present.  Vascular: Dorsalis Pedis artery and Posterior Tibial artery pedal pulses are 2/4 bilateral with immedate capillary fill time. Pedal hair growth present. No varicosities and no lower extremity edema present bilateral.   Neruologic: Grossly intact via light touch bilateral. Vibratory intact via tuning fork bilateral.  Protective threshold with Semmes Wienstein monofilament intact to all pedal sites bilateral. Patellar and Achilles deep tendon reflexes 2+ bilateral. No Babinski or clonus noted bilateral.   Musculoskeletal: No gross boney pedal deformities bilateral. No pain, crepitus, or limitation noted with foot and ankle range of motion bilateral. Muscular strength 5/5 in all groups tested bilateral.  She has pain with palpation anatomical neck of the second metatarsal she also has tenderness on range of motion and on palpation of the second metatarsophalangeal joint.  Gait: Unassisted, Nonantalgic.    Radiographs:  Radiographs taken today demonstrate an osseously mature individual left foot.  There is mild to moderate osteopenia.  There is an area of sclerosis just proximal to the anatomical neck of the second metatarsal.  There does not appear to be any bony callus present.  Assessment & Plan:   Assessment: Stress fracture anatomical neck second metatarsal left foot.  Capsulitis second metatarsophalangeal joint left foot.  Plan: At this point we will put her in a Darco shoe and I will follow-up with her in 1 month for another set of x-rays we will also consider an injection at that time should the joint still be painful.      T. [place], [place]

## 2019-09-25 ENCOUNTER — Encounter: Payer: Self-pay | Admitting: Podiatry

## 2019-09-25 ENCOUNTER — Ambulatory Visit (INDEPENDENT_AMBULATORY_CARE_PROVIDER_SITE_OTHER): Payer: Medicare Other

## 2019-09-25 ENCOUNTER — Ambulatory Visit (INDEPENDENT_AMBULATORY_CARE_PROVIDER_SITE_OTHER): Payer: Medicare Other | Admitting: Podiatry

## 2019-09-25 ENCOUNTER — Other Ambulatory Visit: Payer: Self-pay

## 2019-09-25 DIAGNOSIS — M84375D Stress fracture, left foot, subsequent encounter for fracture with routine healing: Secondary | ICD-10-CM

## 2019-09-25 DIAGNOSIS — M778 Other enthesopathies, not elsewhere classified: Secondary | ICD-10-CM | POA: Diagnosis not present

## 2019-09-25 NOTE — Progress Notes (Signed)
She presents today for follow-up of her pain in her forefoot left.  States that is still hurting he is probably end up having to give me shot today.  Objective: Vital signs are stable alert oriented x3.  Still has pain to palpation at the second metatarsophalangeal joint area.  Radiographs taken today to confirm that there is no stress fracture present.  Assessment: No stress fracture present most likely capsulitis.  Plan: Injected 10 mg Kenalog to the medial and lateral aspect around the second metatarsophalangeal joint.  Discussed the continued use of a Darco shoe and I will follow-up with her in 1 month

## 2019-09-26 ENCOUNTER — Telehealth: Payer: Self-pay | Admitting: Podiatry

## 2019-09-26 NOTE — Telephone Encounter (Signed)
Pt called and wanted to know if she could wear a croc instead of a clark that her a hyatt had discussed please assist

## 2019-09-27 ENCOUNTER — Other Ambulatory Visit: Payer: Self-pay

## 2019-09-27 ENCOUNTER — Ambulatory Visit
Admission: RE | Admit: 2019-09-27 | Discharge: 2019-09-27 | Disposition: A | Discharge status: Home / Self Care | Payer: Medicare Other | Source: Ambulatory Visit | Attending: Internal Medicine | Admitting: Internal Medicine

## 2019-09-27 DIAGNOSIS — Z1231 Encounter for screening mammogram for malignant neoplasm of breast: Secondary | ICD-10-CM | POA: Diagnosis not present

## 2019-11-06 ENCOUNTER — Ambulatory Visit: Payer: Medicare Other | Admitting: Podiatry

## 2019-11-06 DIAGNOSIS — R7302 Impaired glucose tolerance (oral): Secondary | ICD-10-CM | POA: Diagnosis not present

## 2019-11-06 DIAGNOSIS — E039 Hypothyroidism, unspecified: Secondary | ICD-10-CM | POA: Diagnosis not present

## 2019-11-06 DIAGNOSIS — E559 Vitamin D deficiency, unspecified: Secondary | ICD-10-CM | POA: Diagnosis not present

## 2019-11-06 DIAGNOSIS — E785 Hyperlipidemia, unspecified: Secondary | ICD-10-CM | POA: Diagnosis not present

## 2019-11-08 ENCOUNTER — Ambulatory Visit (INDEPENDENT_AMBULATORY_CARE_PROVIDER_SITE_OTHER): Payer: Medicare Other | Admitting: Podiatry

## 2019-11-08 ENCOUNTER — Ambulatory Visit: Payer: Medicare Other

## 2019-11-08 ENCOUNTER — Encounter: Payer: Self-pay | Admitting: Podiatry

## 2019-11-08 ENCOUNTER — Other Ambulatory Visit: Payer: Self-pay

## 2019-11-08 DIAGNOSIS — M778 Other enthesopathies, not elsewhere classified: Secondary | ICD-10-CM | POA: Diagnosis not present

## 2019-11-08 DIAGNOSIS — M84375D Stress fracture, left foot, subsequent encounter for fracture with routine healing: Secondary | ICD-10-CM

## 2019-11-08 NOTE — Progress Notes (Addendum)
She presents today for follow-up of her capsulitis left states there is still some pain but not nearly as much as it was.  I am she is better than it was.  Objective: Vital signs are stable alert oriented x3.  Pulses are palpable.  Still has mild tenderness on palpation of the plantar aspect of the second metatarsophalangeal joint.  Assessment: Capsulitis sec metatarsal phalangeal joint resolving proximally 70%.  Plan: After sterile betadine skin prep I injected 2 mg of dexamethasone from the plantar approach to the level of the point of maximal tenderness at the second metatarsophalangeal joint.  Follow-up with her as needed.

## 2019-11-13 DIAGNOSIS — M8580 Other specified disorders of bone density and structure, unspecified site: Secondary | ICD-10-CM | POA: Diagnosis not present

## 2019-11-13 DIAGNOSIS — R7302 Impaired glucose tolerance (oral): Secondary | ICD-10-CM | POA: Diagnosis not present

## 2019-11-13 DIAGNOSIS — Z1331 Encounter for screening for depression: Secondary | ICD-10-CM | POA: Diagnosis not present

## 2019-11-13 DIAGNOSIS — E039 Hypothyroidism, unspecified: Secondary | ICD-10-CM | POA: Diagnosis not present

## 2019-11-13 DIAGNOSIS — I499 Cardiac arrhythmia, unspecified: Secondary | ICD-10-CM | POA: Diagnosis not present

## 2019-11-13 DIAGNOSIS — Z1339 Encounter for screening examination for other mental health and behavioral disorders: Secondary | ICD-10-CM | POA: Diagnosis not present

## 2019-11-13 DIAGNOSIS — Z23 Encounter for immunization: Secondary | ICD-10-CM | POA: Diagnosis not present

## 2019-11-13 DIAGNOSIS — Z0000 Encounter for general adult medical examination without abnormal findings: Secondary | ICD-10-CM | POA: Diagnosis not present

## 2019-11-13 DIAGNOSIS — I272 Pulmonary hypertension, unspecified: Secondary | ICD-10-CM | POA: Diagnosis not present

## 2019-11-13 DIAGNOSIS — J449 Chronic obstructive pulmonary disease, unspecified: Secondary | ICD-10-CM | POA: Diagnosis not present

## 2019-11-13 DIAGNOSIS — E785 Hyperlipidemia, unspecified: Secondary | ICD-10-CM | POA: Diagnosis not present

## 2019-11-13 DIAGNOSIS — I700 Atherosclerosis of aorta: Secondary | ICD-10-CM | POA: Diagnosis not present

## 2019-11-13 DIAGNOSIS — M65331 Trigger finger, right middle finger: Secondary | ICD-10-CM | POA: Diagnosis not present

## 2019-11-13 DIAGNOSIS — R82998 Other abnormal findings in urine: Secondary | ICD-10-CM | POA: Diagnosis not present

## 2019-11-13 DIAGNOSIS — I10 Essential (primary) hypertension: Secondary | ICD-10-CM | POA: Diagnosis not present

## 2019-11-24 DIAGNOSIS — Z23 Encounter for immunization: Secondary | ICD-10-CM | POA: Diagnosis not present

## 2020-01-08 ENCOUNTER — Other Ambulatory Visit: Payer: Self-pay | Admitting: Internal Medicine

## 2020-01-08 DIAGNOSIS — I493 Ventricular premature depolarization: Secondary | ICD-10-CM

## 2020-01-25 DIAGNOSIS — Z1212 Encounter for screening for malignant neoplasm of rectum: Secondary | ICD-10-CM | POA: Diagnosis not present

## 2020-01-31 ENCOUNTER — Other Ambulatory Visit: Payer: Self-pay

## 2020-01-31 ENCOUNTER — Encounter: Payer: Self-pay | Admitting: Podiatry

## 2020-01-31 ENCOUNTER — Ambulatory Visit (INDEPENDENT_AMBULATORY_CARE_PROVIDER_SITE_OTHER): Payer: Medicare Other | Admitting: Podiatry

## 2020-01-31 DIAGNOSIS — S99922A Unspecified injury of left foot, initial encounter: Secondary | ICD-10-CM

## 2020-01-31 DIAGNOSIS — M778 Other enthesopathies, not elsewhere classified: Secondary | ICD-10-CM

## 2020-01-31 MED ORDER — TRIAMCINOLONE ACETONIDE 40 MG/ML IJ SUSP
20.0000 mg | Freq: Once | INTRAMUSCULAR | Status: AC
  Administered 2020-01-31: 20 mg

## 2020-01-31 NOTE — Progress Notes (Signed)
She presents today for follow-up of her capsulitis second metatarsophalangeal joint of the left foot.  She states that I am ready to do what ever have to do to make this thing better.  Objective: Vital signs are stable she alert oriented x3 she has contraction of the second metatarsophalangeal joint with medial deviation of the second toe mild flexor contraction at the PIPJ.  Tender on palpation and range of motion of the second metatarsophalangeal joint due to the medial deviation I suspect a tear of the plantar plate.  Assessment: Probable plantar plate tear otherwise capsulitis second metatarsophalangeal joint.  Flexible hammertoe.  Plan: Discussed etiology pathology conservative surgical therapies at this point I injected around the joint with 4 mg of dexamethasone.  Tolerated procedure well.  We are going to send her to have an MRI done of the second metatarsophalangeal joint.  Highly suspect a tear of the plantar plate.  If there is no tear of the plantar plate we can try and extensor tenotomy to help lower the toe.

## 2020-01-31 NOTE — Addendum Note (Signed)
Addended by: Clovis Riley E on: 01/31/2020 12:09 PM   Modules accepted: Orders

## 2020-02-04 DIAGNOSIS — Z20822 Contact with and (suspected) exposure to covid-19: Secondary | ICD-10-CM | POA: Diagnosis not present

## 2020-02-04 DIAGNOSIS — J440 Chronic obstructive pulmonary disease with acute lower respiratory infection: Secondary | ICD-10-CM | POA: Diagnosis not present

## 2020-02-04 DIAGNOSIS — Z03818 Encounter for observation for suspected exposure to other biological agents ruled out: Secondary | ICD-10-CM | POA: Diagnosis not present

## 2020-02-11 DIAGNOSIS — M65331 Trigger finger, right middle finger: Secondary | ICD-10-CM | POA: Diagnosis not present

## 2020-02-29 ENCOUNTER — Ambulatory Visit
Admission: RE | Admit: 2020-02-29 | Discharge: 2020-02-29 | Disposition: A | Discharge status: Home / Self Care | Payer: Medicare Other | Source: Ambulatory Visit | Attending: Podiatry | Admitting: Podiatry

## 2020-02-29 ENCOUNTER — Other Ambulatory Visit: Payer: Self-pay

## 2020-02-29 DIAGNOSIS — M19072 Primary osteoarthritis, left ankle and foot: Secondary | ICD-10-CM | POA: Diagnosis not present

## 2020-02-29 DIAGNOSIS — S99922A Unspecified injury of left foot, initial encounter: Secondary | ICD-10-CM

## 2020-03-05 ENCOUNTER — Telehealth: Payer: Self-pay | Admitting: *Deleted

## 2020-03-05 NOTE — Telephone Encounter (Signed)
Called patient -   Informed her of the MRI results-patient would like to have the in office tenotomy procedure. Referred to scheduler to make this appt for her.

## 2020-03-05 NOTE — Telephone Encounter (Signed)
-----   Message from Garrel Ridgel, [place] sent at 03/04/2020  7:10 AM EST ----- Capsulitis of the second MTPJ. No tear. History of gout and osteoarthritis.  Could send to pt or consider sx.

## 2020-03-06 NOTE — Telephone Encounter (Signed)
She is already scheduled on 03/18/2020 at 9:45 and 03/11/2020 is not avail

## 2020-03-07 NOTE — Telephone Encounter (Signed)
She is a tenotomy procedure... needs to be at 11:30. He said it was okay to put her with the other 11:30 because it's just a nail check. So if you could move her to 1/18 at 11:30.

## 2020-03-11 NOTE — Telephone Encounter (Signed)
So she asked that we keep the same day and do the 11:30 on 03/18/2020. She is scheduled. Let me know if I need to do anything else

## 2020-03-18 ENCOUNTER — Other Ambulatory Visit: Payer: Self-pay

## 2020-03-18 ENCOUNTER — Encounter: Payer: Self-pay | Admitting: Podiatry

## 2020-03-18 ENCOUNTER — Ambulatory Visit (INDEPENDENT_AMBULATORY_CARE_PROVIDER_SITE_OTHER): Payer: Medicare Other | Admitting: Podiatry

## 2020-03-18 DIAGNOSIS — M205X9 Other deformities of toe(s) (acquired), unspecified foot: Secondary | ICD-10-CM

## 2020-03-18 DIAGNOSIS — M205X2 Other deformities of toe(s) (acquired), left foot: Secondary | ICD-10-CM | POA: Diagnosis not present

## 2020-03-18 NOTE — Progress Notes (Signed)
She presents today for follow-up of her painful second digit left foot.  She has contracted second metatarsophalangeal joint is resulting in pain beneath the second metatarsophalangeal joint.  MRI was taken.  Objective: Vital signs are stable alert oriented x3 pulses are palpable.  Toe appears to be sitting rectus at this point there is still contracted dorsally and at the level of the PIPJ with mild osteoarthritic changes at the PIPJ.  She still has some tenderness on palpation of the second metatarsophalangeal joint.  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  Plan: Local anesthetic was administered today after consent was received regarding a extensor tenotomy to the second toe just trying to bring the toe down a little bit so the joint will feel better.  After local anesthetic was administered a small stab incision was made and then Dermabond was placed in a dry sterile compressive dressing.  Follow-up with her in 2 weeks dispensed a Darco shoe.

## 2020-03-18 NOTE — Patient Instructions (Signed)
Leave bandage in place and dry for 4 days, then remove. You may wash foot normally after removal of bandage. DO NOT SOAK FOOT! Dry completely afterwards and may use a bandaid over incision if needed. We will follow up with you in 1 weeks for recheck.  

## 2020-03-19 ENCOUNTER — Other Ambulatory Visit: Payer: Self-pay | Admitting: Internal Medicine

## 2020-03-19 DIAGNOSIS — I493 Ventricular premature depolarization: Secondary | ICD-10-CM

## 2020-03-26 ENCOUNTER — Telehealth: Payer: Self-pay | Admitting: Podiatry

## 2020-03-26 NOTE — Telephone Encounter (Signed)
Pt is currently is still in boot and she wanted to know does she have to wear it until 02/17 which is the appointment I had to r/s her to

## 2020-03-27 ENCOUNTER — Ambulatory Visit: Payer: Medicare Other | Admitting: Podiatry

## 2020-04-10 ENCOUNTER — Other Ambulatory Visit: Payer: Self-pay | Admitting: Internal Medicine

## 2020-04-10 ENCOUNTER — Other Ambulatory Visit: Payer: Self-pay

## 2020-04-10 ENCOUNTER — Ambulatory Visit (INDEPENDENT_AMBULATORY_CARE_PROVIDER_SITE_OTHER): Payer: Medicare Other | Admitting: Podiatry

## 2020-04-10 ENCOUNTER — Encounter: Payer: Self-pay | Admitting: Podiatry

## 2020-04-10 DIAGNOSIS — I493 Ventricular premature depolarization: Secondary | ICD-10-CM

## 2020-04-10 DIAGNOSIS — M205X9 Other deformities of toe(s) (acquired), unspecified foot: Secondary | ICD-10-CM

## 2020-04-13 ENCOUNTER — Encounter: Payer: Self-pay | Admitting: Podiatry

## 2020-04-13 NOTE — Progress Notes (Signed)
She presents today for follow-up of her tenotomy second toe left foot. She states that is tender and is not laying down quite all the way.  Objective: Vital signs are stable she is alert oriented x3. Pulses remain palpable the toe is much more rectus than it was previously. However she does have osteoarthritic changes at the PIPJ of the second toe not allowing her to lay down flat completely. Also she has longstanding hammertoe deformity with contracture of the skin and soft tissues.  Assessment: Well-healing tenotomy second toe left.  Plan: I did dispense a hammertoe corrected for her to wear at nighttime to keep that toe stretched and pulled down to like to follow-up with her in a couple of months which we may end up having to discuss surgical interventions.

## 2020-05-19 ENCOUNTER — Other Ambulatory Visit: Payer: Self-pay | Admitting: Internal Medicine

## 2020-05-19 DIAGNOSIS — I493 Ventricular premature depolarization: Secondary | ICD-10-CM

## 2020-05-27 DIAGNOSIS — M65331 Trigger finger, right middle finger: Secondary | ICD-10-CM | POA: Diagnosis not present

## 2020-06-10 ENCOUNTER — Ambulatory Visit (INDEPENDENT_AMBULATORY_CARE_PROVIDER_SITE_OTHER): Payer: Medicare Other | Admitting: Podiatry

## 2020-06-10 ENCOUNTER — Other Ambulatory Visit: Payer: Self-pay

## 2020-06-10 DIAGNOSIS — M7752 Other enthesopathy of left foot: Secondary | ICD-10-CM

## 2020-06-10 MED ORDER — DEXAMETHASONE SODIUM PHOSPHATE 120 MG/30ML IJ SOLN
2.0000 mg | Freq: Once | INTRAMUSCULAR | Status: AC
  Administered 2020-06-10: 2 mg via INTRA_ARTICULAR

## 2020-06-11 NOTE — Progress Notes (Signed)
She presents today for follow-up of her tenotomy patient states that since her last visit her foot is getting better she states that she is still wearing the alignment splint every night.  Objective: Vital signs are stable she is alert and oriented x3 noted the toe has arthritis second left it is sitting rectus at the metatarsal phalangeal joint.  She still has some contraction at the PIPJ and is does not demonstrate any signs of rubbing in her shoes.  Assessment: Well-healing surgical toe.  Plan: Follow-up with me as needed continue to wear the splint for another month or so.

## 2020-06-19 ENCOUNTER — Other Ambulatory Visit: Payer: Self-pay | Admitting: Internal Medicine

## 2020-06-19 DIAGNOSIS — I493 Ventricular premature depolarization: Secondary | ICD-10-CM

## 2020-06-25 ENCOUNTER — Ambulatory Visit (INDEPENDENT_AMBULATORY_CARE_PROVIDER_SITE_OTHER): Payer: Medicare Other | Admitting: Internal Medicine

## 2020-06-25 ENCOUNTER — Encounter: Payer: Self-pay | Admitting: Internal Medicine

## 2020-06-25 ENCOUNTER — Other Ambulatory Visit: Payer: Self-pay

## 2020-06-25 VITALS — BP 148/70 | HR 53 | Ht 60.0 in | Wt 140.6 lb

## 2020-06-25 DIAGNOSIS — I10 Essential (primary) hypertension: Secondary | ICD-10-CM

## 2020-06-25 DIAGNOSIS — I493 Ventricular premature depolarization: Secondary | ICD-10-CM

## 2020-06-25 NOTE — Progress Notes (Signed)
HPI Mrs. Mcloughlin returns today for followup. She is a pleasant 80 yo woman with a h/o palpitations who has been controlled on low dose flecainide. She has been under increased stress as her brother died mysteriously in [place]. She is frustrated by her inability to lose weight. No Known Allergies   Current Outpatient Medications  Medication Sig Dispense Refill  . albuterol (VENTOLIN HFA) 108 (90 Base) MCG/ACT inhaler Inhale 2 puffs into the lungs every 6 (six) hours as needed for wheezing.    Marland Kitchen aspirin 81 MG tablet Take 81 mg by mouth daily.    . beclomethasone (QVAR REDIHALER) 40 MCG/ACT inhaler Inhale 2 puffs into the lungs 2 (two) times daily. 1 Inhaler 5  . beclomethasone (QVAR) 80 MCG/ACT inhaler Inhale 2 puffs into the lungs 2 (two) times daily. 1 Inhaler 5  . Biotin 5000 MCG TABS Take 5,000 mcg by mouth daily. \    . CINNAMON PO Take 1,000 mg by mouth daily.    Marland Kitchen estrogens, conjugated, (PREMARIN) 0.9 MG tablet Take 0.9 mg by mouth as directed. Take daily for 21 days then do not take for 7 days.    . flecainide (TAMBOCOR) 50 MG tablet Take 1 tablet (50 mg total) by mouth 2 (two) times daily. Take 1 additional tablet as needed for heart rate. 90 tablet 0  . Glucosamine-Chondroitin (OSTEO BI-FLEX REGULAR STRENGTH PO) Take 1 tablet by mouth daily.    . hydrochlorothiazide (HYDRODIURIL) 25 MG tablet Take 1 tablet by mouth daily.    . Misc Natural Products (OSTEO BI-FLEX JOINT SHIELD PO) Take 1 capsule by mouth daily.     . rosuvastatin (CRESTOR) 10 MG tablet Take 1 tablet by mouth daily.    . Sodium Fluoride 1.1 % PSTE Place onto teeth as directed.     Marland Kitchen SYNTHROID 75 MCG tablet Take 1 tablet by mouth daily.    Marland Kitchen tiotropium (SPIRIVA HANDIHALER) 18 MCG inhalation capsule PLACE 1 CAPSULE INTO INHALER AND INHALE DAILY. 90 capsule 4  . verapamil (CALAN-SR) 120 MG CR tablet TAKE ONE TABLET BY MOUTH EVERY NIGHT AT BEDTIME **PLEASE MAKE APPOINTMENT FOR REFILLS PER MD** 30 tablet 1  . Vitamin  D, Ergocalciferol, (DRISDOL) 50000 UNITS CAPS Take 50,000 Units by mouth. Twice a month    . zolpidem (AMBIEN) 10 MG tablet Take 10 mg by mouth at bedtime as needed for sleep.      No current facility-administered medications for this visit.     Past Medical History:  Diagnosis Date  . Allergy    occasional seasonal  . Asthma   . COPD (chronic obstructive pulmonary disease) ([place])   . Emphysema of lung ([place])   . H/O: hysterectomy   . Heart murmur   . HTN (hypertension)   . HTN (hypertension)   . IGT (impaired glucose tolerance)   . Irregular heart rate    take Flecinade  . OSA (obstructive sleep apnea) 12/08/2017  . Osteopenia   . Palpitations   . Thyroid disease    hypothyroid  . Tinnitus   . Tremor, essential   . Vertigo     ROS:   All systems reviewed and negative except as noted in the HPI.   Past Surgical History:  Procedure Laterality Date  . CATARACT EXTRACTION     both eyes- February 2015  . orthopedic procedure     both feet  . TRIGGER FINGER RELEASE     right hand     Family History  Problem Relation Age of Onset  . Kidney disease Mother   . Cirrhosis Mother   . Lung cancer Father   . Heart disease Maternal Grandmother   . Colon cancer Neg Hx   . Esophageal cancer Neg Hx   . Rectal cancer Neg Hx   . Stomach cancer Neg Hx   . Pancreatic cancer Neg Hx      Social History   Socioeconomic History  . Marital status: Divorced    Spouse name: Not on file  . Number of children: 0  . Years of education: Not on file  . Highest education level: Not on file  Occupational History  . Occupation: retired Estate agent for [employer]  Tobacco Use  . Smoking status: Never Smoker  . Smokeless tobacco: Never Used  Substance and Sexual Activity  . Alcohol use: No  . Drug use: No  . Sexual activity: Not on file  Other Topics Concern  . Not on file  Social History Narrative  . Not on file   Social Determinants of Health   Financial  Resource Strain: Not on file  Food Insecurity: Not on file  Transportation Needs: Not on file  Physical Activity: Not on file  Stress: Not on file  Social Connections: Not on file  Intimate Partner Violence: Not on file     BP (!) 148/70   Pulse (!) 53   Ht 5' (1.524 m)   Wt 140 lb 9.6 oz (63.8 kg)   SpO2 95%   BMI 27.46 kg/m   Physical Exam:  Well appearing 80 yo woman, NAD HEENT: Unremarkable Neck:  No JVD, no thyromegally Lymphatics:  No adenopathy Back:  No CVA tenderness Lungs:  Clear with no wheezes HEART:  Regular rate rhythm, no murmurs, no rubs, no clicks Abd:  soft, positive bowel sounds, no organomegally, no rebound, no guarding Ext:  2 plus pulses, no edema, no cyanosis, no clubbing Skin:  No rashes no nodules Neuro:  CN II through XII intact, motor grossly intact  EKG - nsr   Assess/Plan: 1. Palpitations - these are well controlled on low dose flecainide. She will continue. 2. PVC's - well controlled. We will follow. 3. HTN - her sbp is elevated but usually better at home. Continue her calcium blocker 4. Weight gain - she is frustrated by her inability to lose weight. I counseled her on intermittent fasting.  Carleene Overlie ,MD

## 2020-06-25 NOTE — Patient Instructions (Signed)

## 2020-07-14 ENCOUNTER — Other Ambulatory Visit: Payer: Self-pay

## 2020-07-14 DIAGNOSIS — I493 Ventricular premature depolarization: Secondary | ICD-10-CM

## 2020-07-14 MED ORDER — VERAPAMIL HCL ER 120 MG PO TBCR: 120.0000 mg | EXTENDED_RELEASE_TABLET | Freq: Every day | ORAL | 3 refills | Status: DC

## 2020-07-14 NOTE — Telephone Encounter (Signed)
Pt's medication was sent to pt's pharmacy as requested. Confirmation received.  °

## 2020-07-17 DIAGNOSIS — M65331 Trigger finger, right middle finger: Secondary | ICD-10-CM | POA: Diagnosis not present

## 2020-08-09 ENCOUNTER — Other Ambulatory Visit: Payer: Self-pay | Admitting: Internal Medicine

## 2020-08-09 DIAGNOSIS — I493 Ventricular premature depolarization: Secondary | ICD-10-CM

## 2020-08-12 ENCOUNTER — Encounter: Payer: Self-pay | Admitting: Podiatry

## 2020-08-12 ENCOUNTER — Other Ambulatory Visit: Payer: Self-pay

## 2020-08-12 ENCOUNTER — Ambulatory Visit (INDEPENDENT_AMBULATORY_CARE_PROVIDER_SITE_OTHER): Payer: Medicare Other | Admitting: Podiatry

## 2020-08-12 DIAGNOSIS — M2042 Other hammer toe(s) (acquired), left foot: Secondary | ICD-10-CM | POA: Diagnosis not present

## 2020-08-12 DIAGNOSIS — M21622 Bunionette of left foot: Secondary | ICD-10-CM | POA: Diagnosis not present

## 2020-08-12 NOTE — Progress Notes (Signed)
She presents today to discuss surgical intervention regarding the fifth metatarsal and the second toe left foot.  Objective: Vital signs are stable alert oriented x3.  Pulses are palpable.  There is hammertoe deformity second left as well as a dorsal lateral spur overlying the fifth metatarsal head of the left foot.  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  Plan: Consented her today for hammertoe repair second left and an exostectomy with plantar condylectomy fifth left.  We did discuss the pros and cons of the surgery and the possible side effects and complications which may include but not limited to postop pain bleeding swelling infection recurrence need for further surgery overcorrection under correction loss of digit loss of limb loss of life.  Signed a 3 page of the consent form I will follow-up with her in the near future for surgical intervention.

## 2020-08-14 ENCOUNTER — Encounter: Payer: Self-pay | Admitting: Podiatry

## 2020-08-14 ENCOUNTER — Telehealth: Payer: Self-pay | Admitting: Internal Medicine

## 2020-08-14 NOTE — Telephone Encounter (Signed)
   [facility] Pre-operative Risk Assessment    Request for surgical clearance:  What type of surgery is being performed?  Tailor's Bunionectomy & Hammer Toe Repair of Left Foot (2nd toe)  When is this surgery scheduled? 08/29/20   What type of clearance is required (medical clearance vs. Pharmacy clearance to hold med vs. Both)?  Both   Are there any medications that need to be held prior to surgery and how long? Their office is requesting our recommendation regarding medication.   Practice name and name of physician performing surgery? Tyson Dense, DPM  [facility]   What is your office phone number? [phone]    7.   What is your office fax number? [phone]  8.   Anesthesia type (None, local, MAC, general) ?  Choice anesthesia    Kirsten [name] 08/14/2020, 9:45 AM  _________________________________________________________________

## 2020-08-14 NOTE — Telephone Encounter (Signed)
   Name: Kirsten [name]  DOB: [birth year]  MRN: [id number]   Primary Cardiologist: Cristopher Peru, MD  Chart reviewed as part of pre-operative protocol coverage. Patient was contacted 08/14/2020 in reference to pre-operative risk assessment for pending surgery as outlined below.  Kirsten [name] was last seen on 06/25/20 by Dr. Denyce Robert.  Since that day, Kirsten [name] has done well, aside from foot pain.  She can complete more than 4.0 METS without angina. She may hold ASA for 5-7 days prior to procedure.   Therefore, based on ACC/AHA guidelines, the patient would be at acceptable risk for the planned procedure without further cardiovascular testing.   The patient was advised that if she develops new symptoms prior to surgery to contact our office to arrange for a follow-up visit, and she verbalized understanding.  I will route this recommendation to the requesting party via Epic fax function and remove from pre-op pool. Please call with questions.  Ledora Bottcher, PA 08/14/2020, 12:53 PM

## 2020-08-28 ENCOUNTER — Other Ambulatory Visit: Payer: Self-pay | Admitting: Podiatry

## 2020-08-28 MED ORDER — OXYCODONE-ACETAMINOPHEN 10-325 MG PO TABS: 1.0000 | ORAL_TABLET | Freq: Three times a day (TID) | ORAL | 0 refills | Status: AC | PRN

## 2020-08-28 MED ORDER — ONDANSETRON HCL 4 MG PO TABS: 4.0000 mg | ORAL_TABLET | Freq: Three times a day (TID) | ORAL | 0 refills | Status: DC | PRN

## 2020-08-28 MED ORDER — CEPHALEXIN 500 MG PO CAPS: 500.0000 mg | ORAL_CAPSULE | Freq: Three times a day (TID) | ORAL | 0 refills | Status: DC

## 2020-08-29 DIAGNOSIS — M2042 Other hammer toe(s) (acquired), left foot: Secondary | ICD-10-CM | POA: Diagnosis not present

## 2020-08-29 DIAGNOSIS — M2012 Hallux valgus (acquired), left foot: Secondary | ICD-10-CM | POA: Diagnosis not present

## 2020-08-29 DIAGNOSIS — M25572 Pain in left ankle and joints of left foot: Secondary | ICD-10-CM | POA: Diagnosis not present

## 2020-08-29 DIAGNOSIS — M21612 Bunion of left foot: Secondary | ICD-10-CM | POA: Diagnosis not present

## 2020-08-29 DIAGNOSIS — M2041 Other hammer toe(s) (acquired), right foot: Secondary | ICD-10-CM | POA: Diagnosis not present

## 2020-09-04 ENCOUNTER — Ambulatory Visit (INDEPENDENT_AMBULATORY_CARE_PROVIDER_SITE_OTHER): Payer: Medicare Other | Admitting: Podiatry

## 2020-09-04 ENCOUNTER — Encounter: Payer: Self-pay | Admitting: Podiatry

## 2020-09-04 ENCOUNTER — Ambulatory Visit (INDEPENDENT_AMBULATORY_CARE_PROVIDER_SITE_OTHER): Payer: Medicare Other

## 2020-09-04 ENCOUNTER — Other Ambulatory Visit: Payer: Self-pay

## 2020-09-04 DIAGNOSIS — M21622 Bunionette of left foot: Secondary | ICD-10-CM

## 2020-09-04 DIAGNOSIS — M2042 Other hammer toe(s) (acquired), left foot: Secondary | ICD-10-CM

## 2020-09-08 NOTE — Progress Notes (Signed)
  Subjective:  Patient ID: Kirsten [name], female    DOB: 1941-02-11,  MRN: [id number]  Chief Complaint  Patient presents with   Routine Post Op    (xray)POV #1 DOS 08/29/2020 TAILORS BUNION DEFORMITY REPAIR & HAMMERTOE REPAIR 2ND TOE/DR HYATT PT    80 y.o. female returns for post-op check.  Doing well not having much pain  Review of Systems: Negative except as noted in the HPI. Denies N/V/F/Ch.   Objective:  There were no vitals filed for this visit. There is no height or weight on file to calculate BMI. Constitutional Well developed. Well nourished.  Vascular Foot warm and well perfused. Capillary refill normal to all digits.   Neurologic Normal speech. Oriented to person, place, and time. Epicritic sensation to light touch grossly present bilaterally.  Dermatologic Skin healing well without signs of infection. Skin edges well coapted without signs of infection.  Orthopedic: Tenderness to palpation noted about the surgical site.   Multiple view plain film radiographs: Status post exostectomy lateral talar side and second hammertoe correction Kirschner wire intact in good position Assessment:   1. Tailor's bunion of left foot   2. Hammer toe of left foot    Plan:  Patient was evaluated and treated and all questions answered.  S/p foot surgery left -Progressing as expected post-operatively. -XR: As above -WB Status: WBAT in surgical shoe -Sutures: Left intact. -Medications: No refills required -Foot redressed.  Return in about 1 week (around 09/11/2020) for post op (no x-rays), suture removal.

## 2020-09-11 ENCOUNTER — Other Ambulatory Visit: Payer: Self-pay

## 2020-09-11 ENCOUNTER — Ambulatory Visit (INDEPENDENT_AMBULATORY_CARE_PROVIDER_SITE_OTHER): Payer: Medicare Other | Admitting: Podiatry

## 2020-09-11 ENCOUNTER — Encounter: Payer: Self-pay | Admitting: Podiatry

## 2020-09-11 DIAGNOSIS — M21622 Bunionette of left foot: Secondary | ICD-10-CM | POA: Diagnosis not present

## 2020-09-11 DIAGNOSIS — M2042 Other hammer toe(s) (acquired), left foot: Secondary | ICD-10-CM

## 2020-09-11 DIAGNOSIS — Z9889 Other specified postprocedural states: Secondary | ICD-10-CM

## 2020-09-13 NOTE — Progress Notes (Signed)
She presents today with her neighbor and her neighbor son who have been taking care of her and her dog while she has been trying to recover from her foot surgery.  She states that her left foot has really been hurting.  Her neighbor goes on to say that she was doing really well and then she was up on her foot way too much having the dog back in her house and taking it for wall.  Patient also admits to this.  Denies any trauma to the foot from either herself or her dog.  Denies fever chills nausea vomiting muscle aches and pains other than the surgical sites.  Objective: Vital signs are stable alert oriented x3 wires intact to the second toe there is minimal edema no erythema cellulitis drainage or odor incision sites appear to be healing very nicely that I do not want a take a chance of removing the sutures at this point I think I will wait another week to remove the sutures.  I do not want any dehiscence especially at her age and anything else to slow her down after this.  Assessment: Well-healing surgical foot.  Plan: Redressed today dressed compressive dressing asked her to bear with this and will get the stitches out next week she understands this and is amendable to it we placed her in a Darco shoe which should help alleviate her hip and back discomfort.  I will follow-up with her in 1 week for suture removal.

## 2020-09-18 ENCOUNTER — Ambulatory Visit (INDEPENDENT_AMBULATORY_CARE_PROVIDER_SITE_OTHER): Payer: Medicare Other | Admitting: Podiatry

## 2020-09-18 ENCOUNTER — Encounter: Payer: Self-pay | Admitting: Podiatry

## 2020-09-18 ENCOUNTER — Other Ambulatory Visit: Payer: Self-pay

## 2020-09-18 DIAGNOSIS — M2042 Other hammer toe(s) (acquired), left foot: Secondary | ICD-10-CM

## 2020-09-18 DIAGNOSIS — Z9889 Other specified postprocedural states: Secondary | ICD-10-CM

## 2020-09-18 DIAGNOSIS — M21622 Bunionette of left foot: Secondary | ICD-10-CM

## 2020-09-18 NOTE — Progress Notes (Signed)
She presents today for her third postop visit date of surgery 08/29/2020 tailor's bunionectomy with repair of hammertoe and pin release of the metatarsophalangeal joint second left.  She states that is feeling better have been on it is much as as he will allow me.  Objective: Vital signs are stable alert and x3 dressed her dressing intact was removed demonstrates K wires intact and tight.  Sutures are intact margins well coapted there is no erythema edema cellulitis drainage or odor.  Removed on the sutures today margins remain well coapted.  Assessment: Well-healing surgical foot left.  Plan: We will inputted in the Darco shoe and allow her to ambulate with that restricting her time to about 30 minutes.  I will allow her to walk the dog to the edge of the grass.  Unguinal follow-up with her in 3 weeks at which time another x-ray will be done before pulling her pin to the second toe.

## 2020-09-25 ENCOUNTER — Encounter: Payer: Medicare Other | Admitting: Podiatry

## 2020-09-29 ENCOUNTER — Other Ambulatory Visit: Payer: Self-pay | Admitting: Internal Medicine

## 2020-09-29 DIAGNOSIS — Z1231 Encounter for screening mammogram for malignant neoplasm of breast: Secondary | ICD-10-CM

## 2020-10-06 ENCOUNTER — Telehealth: Payer: Self-pay | Admitting: Podiatry

## 2020-10-06 NOTE — Telephone Encounter (Signed)
Pt called stating the knob in her foot is sticking straight up. She is asking to speak with you or a nurse directly. Please advise.

## 2020-10-07 ENCOUNTER — Telehealth: Payer: Self-pay | Admitting: *Deleted

## 2020-10-07 NOTE — Telephone Encounter (Signed)
Returned call to patient for more information, no answer, left vmessage for a call back.

## 2020-10-09 ENCOUNTER — Other Ambulatory Visit: Payer: Self-pay

## 2020-10-09 ENCOUNTER — Ambulatory Visit (INDEPENDENT_AMBULATORY_CARE_PROVIDER_SITE_OTHER): Payer: Medicare Other

## 2020-10-09 ENCOUNTER — Ambulatory Visit (INDEPENDENT_AMBULATORY_CARE_PROVIDER_SITE_OTHER): Payer: Medicare Other | Admitting: Podiatry

## 2020-10-09 DIAGNOSIS — M21622 Bunionette of left foot: Secondary | ICD-10-CM

## 2020-10-09 DIAGNOSIS — M2042 Other hammer toe(s) (acquired), left foot: Secondary | ICD-10-CM

## 2020-10-14 DIAGNOSIS — I272 Pulmonary hypertension, unspecified: Secondary | ICD-10-CM | POA: Insufficient documentation

## 2020-10-14 NOTE — Progress Notes (Signed)
  Subjective:  Patient ID: Kirsten [name], female    DOB: [birth year],  MRN: [id number]  Chief Complaint  Patient presents with   Routine Post Op      POV #4 DOS 08/29/2020 TAILORS BUNION DEFORMITY REPAIR & HAMMERTOE REPAIR 2ND TOE     80 y.o. female returns for post-op check.  Overall doing well  Review of Systems: Negative except as noted in the HPI. Denies N/V/F/Ch.   Objective:  There were no vitals filed for this visit. There is no height or weight on file to calculate BMI. Constitutional Well developed. Well nourished.  Vascular Foot warm and well perfused. Capillary refill normal to all digits.   Neurologic Normal speech. Oriented to person, place, and time. Epicritic sensation to light touch grossly present bilaterally.  Dermatologic Incision well-healed  Orthopedic: Minimal tenderness to palpation noted about the surgical site.   Multiple view plain film radiographs: Good bridging across arthrodesis site of toe Assessment:   1. Tailor's bunion of left foot   2. Hammer toe of left foot    Plan:  Patient was evaluated and treated and all questions answered.  S/p foot surgery left -Progressing as expected post-operatively. -XR: As above -WB Status: WBAT in regular shoe [name] -Kirschner wire removed today.  She may begin bathing as tolerated   No follow-ups on file.

## 2020-10-16 DIAGNOSIS — N816 Rectocele: Secondary | ICD-10-CM | POA: Diagnosis not present

## 2020-10-16 DIAGNOSIS — N952 Postmenopausal atrophic vaginitis: Secondary | ICD-10-CM | POA: Diagnosis not present

## 2020-10-16 DIAGNOSIS — Z760 Encounter for issue of repeat prescription: Secondary | ICD-10-CM | POA: Diagnosis not present

## 2020-10-16 DIAGNOSIS — Z79890 Hormone replacement therapy (postmenopausal): Secondary | ICD-10-CM | POA: Diagnosis not present

## 2020-10-16 DIAGNOSIS — Z1239 Encounter for other screening for malignant neoplasm of breast: Secondary | ICD-10-CM | POA: Diagnosis not present

## 2020-10-23 ENCOUNTER — Ambulatory Visit (INDEPENDENT_AMBULATORY_CARE_PROVIDER_SITE_OTHER): Payer: Medicare Other | Admitting: Podiatry

## 2020-10-23 ENCOUNTER — Other Ambulatory Visit: Payer: Self-pay

## 2020-10-23 ENCOUNTER — Ambulatory Visit (INDEPENDENT_AMBULATORY_CARE_PROVIDER_SITE_OTHER): Payer: Medicare Other

## 2020-10-23 DIAGNOSIS — M21622 Bunionette of left foot: Secondary | ICD-10-CM | POA: Diagnosis not present

## 2020-10-23 DIAGNOSIS — D2372 Other benign neoplasm of skin of left lower limb, including hip: Secondary | ICD-10-CM

## 2020-10-25 NOTE — Progress Notes (Signed)
He presents today for follow-up of her left foot surgery.  States that she is doing very well date of surgery 08/29/2020 tailor's bunionectomy and repair of the second toe.  States that the second toe still little bit tender but got real sore spot beneath that outside bone.  Objective: Vital signs stable alert and oriented x3.  Pulses are palpable.  Toe is looking good he is in good position it is opposed by the hallux.  She does have a well-healed incision site fifth metatarsal left.  She does have a small benign skin lesion beneath the area most consistent with keratoma.  Assessment: Porokeratosis well-healing surgical foot.  Plan: I debrided the Porro keratoma of the left foot.  And I will follow-up with her in 1 month or as needed.

## 2020-10-28 NOTE — Addendum Note (Signed)
Addended by: Clovis Riley E on: 10/28/2020 12:12 PM   Modules accepted: Level of Service

## 2020-11-19 ENCOUNTER — Ambulatory Visit
Admission: RE | Admit: 2020-11-19 | Discharge: 2020-11-19 | Disposition: A | Discharge status: Home / Self Care | Payer: Medicare Other | Source: Ambulatory Visit | Attending: Internal Medicine | Admitting: Internal Medicine

## 2020-11-19 ENCOUNTER — Other Ambulatory Visit: Payer: Self-pay

## 2020-11-19 DIAGNOSIS — Z1231 Encounter for screening mammogram for malignant neoplasm of breast: Secondary | ICD-10-CM | POA: Diagnosis not present

## 2020-11-20 ENCOUNTER — Ambulatory Visit (INDEPENDENT_AMBULATORY_CARE_PROVIDER_SITE_OTHER): Payer: Medicare Other

## 2020-11-20 ENCOUNTER — Ambulatory Visit (INDEPENDENT_AMBULATORY_CARE_PROVIDER_SITE_OTHER): Payer: Medicare Other | Admitting: Podiatry

## 2020-11-20 ENCOUNTER — Encounter: Payer: Self-pay | Admitting: Podiatry

## 2020-11-20 DIAGNOSIS — M21622 Bunionette of left foot: Secondary | ICD-10-CM | POA: Diagnosis not present

## 2020-11-20 DIAGNOSIS — D2372 Other benign neoplasm of skin of left lower limb, including hip: Secondary | ICD-10-CM

## 2020-11-20 DIAGNOSIS — Z9889 Other specified postprocedural states: Secondary | ICD-10-CM

## 2020-11-20 DIAGNOSIS — M2042 Other hammer toe(s) (acquired), left foot: Secondary | ICD-10-CM | POA: Diagnosis not present

## 2020-11-21 NOTE — Progress Notes (Signed)
She presents today date of surgery 08/29/2020 status post tailor's bunionectomy and hammertoe repair second right.  She states that it feels good just hurts on the bottom occasionally.  Objective: Vital signs are stable alert and oriented x3.  Pulses are palpable.  There is no erythema edema cellulitis drainage or odor.  No tenderness on palpation.  Radiographs taken today demonstrate well-healed tailor's bunionectomy however she does retain a nonunion at the PIPJ of the second toe left foot.  Though the toe does seem to be sitting rectus at this time.  Assessment: Well-healing surgical foot decrease in pain.  Plan: Follow-up with me on an as-needed basis continue to wear shoes in the house not go barefoot

## 2020-11-24 DIAGNOSIS — E785 Hyperlipidemia, unspecified: Secondary | ICD-10-CM | POA: Diagnosis not present

## 2020-11-24 DIAGNOSIS — E039 Hypothyroidism, unspecified: Secondary | ICD-10-CM | POA: Diagnosis not present

## 2020-11-24 DIAGNOSIS — R7302 Impaired glucose tolerance (oral): Secondary | ICD-10-CM | POA: Diagnosis not present

## 2020-11-24 DIAGNOSIS — E559 Vitamin D deficiency, unspecified: Secondary | ICD-10-CM | POA: Diagnosis not present

## 2020-12-01 DIAGNOSIS — R82998 Other abnormal findings in urine: Secondary | ICD-10-CM | POA: Diagnosis not present

## 2020-12-01 DIAGNOSIS — Z0000 Encounter for general adult medical examination without abnormal findings: Secondary | ICD-10-CM | POA: Diagnosis not present

## 2020-12-01 DIAGNOSIS — Z23 Encounter for immunization: Secondary | ICD-10-CM | POA: Diagnosis not present

## 2020-12-01 DIAGNOSIS — I700 Atherosclerosis of aorta: Secondary | ICD-10-CM | POA: Diagnosis not present

## 2020-12-01 DIAGNOSIS — E785 Hyperlipidemia, unspecified: Secondary | ICD-10-CM | POA: Diagnosis not present

## 2020-12-01 DIAGNOSIS — J449 Chronic obstructive pulmonary disease, unspecified: Secondary | ICD-10-CM | POA: Diagnosis not present

## 2020-12-01 DIAGNOSIS — M8580 Other specified disorders of bone density and structure, unspecified site: Secondary | ICD-10-CM | POA: Diagnosis not present

## 2020-12-01 DIAGNOSIS — Z1331 Encounter for screening for depression: Secondary | ICD-10-CM | POA: Diagnosis not present

## 2020-12-01 DIAGNOSIS — G250 Essential tremor: Secondary | ICD-10-CM | POA: Diagnosis not present

## 2020-12-01 DIAGNOSIS — R002 Palpitations: Secondary | ICD-10-CM | POA: Diagnosis not present

## 2020-12-01 DIAGNOSIS — R7302 Impaired glucose tolerance (oral): Secondary | ICD-10-CM | POA: Diagnosis not present

## 2020-12-01 DIAGNOSIS — E039 Hypothyroidism, unspecified: Secondary | ICD-10-CM | POA: Diagnosis not present

## 2020-12-01 DIAGNOSIS — I10 Essential (primary) hypertension: Secondary | ICD-10-CM | POA: Diagnosis not present

## 2021-02-20 DIAGNOSIS — J449 Chronic obstructive pulmonary disease, unspecified: Secondary | ICD-10-CM | POA: Diagnosis not present

## 2021-02-20 DIAGNOSIS — M8580 Other specified disorders of bone density and structure, unspecified site: Secondary | ICD-10-CM | POA: Diagnosis not present

## 2021-02-20 DIAGNOSIS — I10 Essential (primary) hypertension: Secondary | ICD-10-CM | POA: Diagnosis not present

## 2021-02-20 DIAGNOSIS — E785 Hyperlipidemia, unspecified: Secondary | ICD-10-CM | POA: Diagnosis not present

## 2021-03-21 DIAGNOSIS — Z23 Encounter for immunization: Secondary | ICD-10-CM | POA: Diagnosis not present

## 2021-04-14 DIAGNOSIS — L814 Other melanin hyperpigmentation: Secondary | ICD-10-CM | POA: Diagnosis not present

## 2021-04-14 DIAGNOSIS — L570 Actinic keratosis: Secondary | ICD-10-CM | POA: Diagnosis not present

## 2021-04-14 DIAGNOSIS — L308 Other specified dermatitis: Secondary | ICD-10-CM | POA: Diagnosis not present

## 2021-04-14 DIAGNOSIS — L821 Other seborrheic keratosis: Secondary | ICD-10-CM | POA: Diagnosis not present

## 2021-04-14 DIAGNOSIS — L638 Other alopecia areata: Secondary | ICD-10-CM | POA: Diagnosis not present

## 2021-04-14 DIAGNOSIS — L820 Inflamed seborrheic keratosis: Secondary | ICD-10-CM | POA: Diagnosis not present

## 2021-04-14 DIAGNOSIS — L538 Other specified erythematous conditions: Secondary | ICD-10-CM | POA: Diagnosis not present

## 2021-04-14 DIAGNOSIS — R208 Other disturbances of skin sensation: Secondary | ICD-10-CM | POA: Diagnosis not present

## 2021-04-14 DIAGNOSIS — L648 Other androgenic alopecia: Secondary | ICD-10-CM | POA: Diagnosis not present

## 2021-04-14 DIAGNOSIS — D225 Melanocytic nevi of trunk: Secondary | ICD-10-CM | POA: Diagnosis not present

## 2021-04-16 ENCOUNTER — Other Ambulatory Visit: Payer: Self-pay

## 2021-04-16 ENCOUNTER — Encounter: Payer: Self-pay | Admitting: Podiatry

## 2021-04-16 ENCOUNTER — Ambulatory Visit (INDEPENDENT_AMBULATORY_CARE_PROVIDER_SITE_OTHER): Payer: Medicare Other | Admitting: Podiatry

## 2021-04-16 DIAGNOSIS — B351 Tinea unguium: Secondary | ICD-10-CM

## 2021-04-16 DIAGNOSIS — M7752 Other enthesopathy of left foot: Secondary | ICD-10-CM

## 2021-04-16 DIAGNOSIS — M79676 Pain in unspecified toe(s): Secondary | ICD-10-CM

## 2021-04-16 DIAGNOSIS — D2372 Other benign neoplasm of skin of left lower limb, including hip: Secondary | ICD-10-CM

## 2021-04-16 MED ORDER — DEXAMETHASONE SODIUM PHOSPHATE 120 MG/30ML IJ SOLN
2.0000 mg | Freq: Once | INTRAMUSCULAR | Status: AC
  Administered 2021-04-16: 2 mg via INTRA_ARTICULAR

## 2021-04-18 NOTE — Progress Notes (Signed)
She presents today chief complaint of a painful toenail hallux left thinks that it may be ingrown but she is thick at the end.  She is also concerned about a painful area at the fifth metatarsal of her left foot she states that it sometimes stings and burns over here date of surgery and that area was on 08/29/2020.  Objective: Vital signs are stable alert and oriented x3.  Pulses are palpable.  She has a palpable bursa beneath the fifth metatarsal head of the left foot with an overlying area of reactive hyperkeratosis which is a benign skin lesion.  She also has a thickened yellow dystrophic nail hallux left consistent with onychodystrophy.  Assessment: Onychodystrophy hallux left.  Bursitis some fifth metatarsal head left.  Painful benign skin lesions of fifth met head left.  Plan: Discussed etiology pathology conservative surgical therapies I injected the subfifth met head area today with 10 mg Kenalog 5 mg Marcaine point of maximal tenderness.  Tolerated procedure well without complications debrided the benign skin lesion and debrided the nail for her today.  She left feeling much better will notify us with questions or concerns or necessitating a return visit.

## 2021-05-05 DIAGNOSIS — Z20822 Contact with and (suspected) exposure to covid-19: Secondary | ICD-10-CM | POA: Diagnosis not present

## 2021-05-07 DIAGNOSIS — M8580 Other specified disorders of bone density and structure, unspecified site: Secondary | ICD-10-CM | POA: Diagnosis not present

## 2021-05-07 DIAGNOSIS — I700 Atherosclerosis of aorta: Secondary | ICD-10-CM | POA: Diagnosis not present

## 2021-05-07 DIAGNOSIS — I10 Essential (primary) hypertension: Secondary | ICD-10-CM | POA: Diagnosis not present

## 2021-05-07 DIAGNOSIS — J449 Chronic obstructive pulmonary disease, unspecified: Secondary | ICD-10-CM | POA: Diagnosis not present

## 2021-05-07 DIAGNOSIS — N816 Rectocele: Secondary | ICD-10-CM | POA: Diagnosis not present

## 2021-05-07 DIAGNOSIS — E785 Hyperlipidemia, unspecified: Secondary | ICD-10-CM | POA: Diagnosis not present

## 2021-05-07 DIAGNOSIS — G250 Essential tremor: Secondary | ICD-10-CM | POA: Diagnosis not present

## 2021-05-07 DIAGNOSIS — M25511 Pain in right shoulder: Secondary | ICD-10-CM | POA: Diagnosis not present

## 2021-05-07 DIAGNOSIS — R7302 Impaired glucose tolerance (oral): Secondary | ICD-10-CM | POA: Diagnosis not present

## 2021-05-07 DIAGNOSIS — I272 Pulmonary hypertension, unspecified: Secondary | ICD-10-CM | POA: Diagnosis not present

## 2021-05-07 DIAGNOSIS — E039 Hypothyroidism, unspecified: Secondary | ICD-10-CM | POA: Diagnosis not present

## 2021-05-21 ENCOUNTER — Other Ambulatory Visit: Payer: Self-pay | Admitting: *Deleted

## 2021-05-21 DIAGNOSIS — I493 Ventricular premature depolarization: Secondary | ICD-10-CM

## 2021-05-21 MED ORDER — FLECAINIDE ACETATE 50 MG PO TABS: ORAL_TABLET | ORAL | 0 refills | Status: DC

## 2021-06-29 ENCOUNTER — Other Ambulatory Visit: Payer: Self-pay

## 2021-06-29 DIAGNOSIS — I493 Ventricular premature depolarization: Secondary | ICD-10-CM

## 2021-06-29 DIAGNOSIS — Z20822 Contact with and (suspected) exposure to covid-19: Secondary | ICD-10-CM | POA: Diagnosis not present

## 2021-06-29 MED ORDER — VERAPAMIL HCL ER 120 MG PO TBCR: 120.0000 mg | EXTENDED_RELEASE_TABLET | Freq: Every day | ORAL | 0 refills | Status: DC

## 2021-07-02 DIAGNOSIS — M47812 Spondylosis without myelopathy or radiculopathy, cervical region: Secondary | ICD-10-CM | POA: Diagnosis not present

## 2021-07-02 DIAGNOSIS — M5030 Other cervical disc degeneration, unspecified cervical region: Secondary | ICD-10-CM | POA: Diagnosis not present

## 2021-07-02 DIAGNOSIS — M25511 Pain in right shoulder: Secondary | ICD-10-CM | POA: Diagnosis not present

## 2021-07-03 DIAGNOSIS — M47812 Spondylosis without myelopathy or radiculopathy, cervical region: Secondary | ICD-10-CM | POA: Insufficient documentation

## 2021-07-21 NOTE — Progress Notes (Deleted)
PCP:  Ginger Organ., MD Primary Cardiologist: Cristopher Peru, MD Electrophysiologist: Cristopher Peru, MD   Kirsten [name] is a 81 y.o. female seen today for Cristopher Peru, MD for routine electrophysiology followup.  Since last being seen in our clinic the patient reports doing ***.  she denies chest pain, palpitations, dyspnea, PND, orthopnea, nausea, vomiting, dizziness, syncope, edema, weight gain, or early satiety.  Past Medical History:  Diagnosis Date   Allergy    occasional seasonal   Asthma    COPD (chronic obstructive pulmonary disease) ([place])    Emphysema of lung ([place])    H/O: hysterectomy    Heart murmur    HTN (hypertension)    HTN (hypertension)    IGT (impaired glucose tolerance)    Irregular heart rate    take Flecinade   OSA (obstructive sleep apnea) 12/08/2017   Osteopenia    Palpitations    Thyroid disease    hypothyroid   Tinnitus    Tremor, essential    Vertigo    Past Surgical History:  Procedure Laterality Date   CATARACT EXTRACTION     both eyes- February 2015   orthopedic procedure     both feet   TRIGGER FINGER RELEASE     right hand    Current Outpatient Medications  Medication Sig Dispense Refill   albuterol (VENTOLIN HFA) 108 (90 Base) MCG/ACT inhaler Inhale 2 puffs into the lungs every 6 (six) hours as needed for wheezing.     aspirin 81 MG tablet Take 81 mg by mouth daily.     beclomethasone (QVAR REDIHALER) 40 MCG/ACT inhaler Inhale 2 puffs into the lungs 2 (two) times daily. 1 Inhaler 5   beclomethasone (QVAR) 80 MCG/ACT inhaler Inhale 2 puffs into the lungs 2 (two) times daily. 1 Inhaler 5   Biotin 5000 MCG TABS Take 5,000 mcg by mouth daily. \     CINNAMON PO Take 1,000 mg by mouth daily.     estrogens, conjugated, (PREMARIN) 0.9 MG tablet Take 0.9 mg by mouth as directed. Take daily for 21 days then do not take for 7 days.     flecainide (TAMBOCOR) 50 MG tablet Take 1 tablet by mouth twice a day. May take 1 additional tablet by  mouth as needed for fast heart rate. Patient needs appointment for any future refills. Please call office at [phone] 1st attempt. 120 tablet 0   Glucosamine-Chondroitin (OSTEO BI-FLEX REGULAR STRENGTH PO) Take 1 tablet by mouth daily.     hydrochlorothiazide (HYDRODIURIL) 25 MG tablet Take 1 tablet by mouth daily.     Misc Natural Products (OSTEO BI-FLEX JOINT SHIELD PO) Take 1 capsule by mouth daily.      ondansetron (ZOFRAN) 4 MG tablet Take 1 tablet (4 mg total) by mouth every 8 (eight) hours as needed. 20 tablet 0   primidone (MYSOLINE) 50 MG tablet Take 50 mg by mouth daily.     rosuvastatin (CRESTOR) 10 MG tablet Take 1 tablet by mouth daily.     Sodium Fluoride 1.1 % PSTE Place onto teeth as directed.      SYNTHROID 75 MCG tablet Take 1 tablet by mouth daily.     tiotropium (SPIRIVA HANDIHALER) 18 MCG inhalation capsule PLACE 1 CAPSULE INTO INHALER AND INHALE DAILY. 90 capsule 4   verapamil (CALAN-SR) 120 MG CR tablet Take 1 tablet (120 mg total) by mouth at bedtime. Call and schedule follow up visit for further refills. [phone] 30 tablet 0   Vitamin  D, Ergocalciferol, (DRISDOL) 50000 UNITS CAPS Take 50,000 Units by mouth. Twice a month     zolpidem (AMBIEN) 10 MG tablet Take 10 mg by mouth at bedtime as needed for sleep.      No current facility-administered medications for this visit.    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[employer]  Tobacco Use   Smoking status: Never   Smokeless tobacco: Never  Substance and Sexual Activity   Alcohol use: No   Drug use: No   Sexual activity: Not on file  Other Topics Concern   Not on file  Social History Narrative   Not on file   Social Determinants of Health   Financial  Resource Strain: Not on file  Food Insecurity: Not on file  Transportation Needs: Not on file  Physical Activity: Not on file  Stress: Not on file  Social Connections: Not on file  Intimate Partner Violence: Not on file     Review of Systems: All other systems reviewed and are otherwise negative except as noted above.  Physical Exam: There were no vitals filed for this visit.  GEN- The patient is well appearing, alert and oriented x 3 today.   HEENT: normocephalic, atraumatic; sclera clear, conjunctiva pink; hearing intact; oropharynx clear; neck supple, no JVP Lymph- no cervical lymphadenopathy Lungs- Clear to ausculation bilaterally, normal work of breathing.  No wheezes, rales, rhonchi Heart- Regular rate and rhythm, no murmurs, rubs or gallops, PMI not laterally displaced GI- soft, non-tender, non-distended, bowel sounds present, no hepatosplenomegaly Extremities- no clubbing, cyanosis, or edema; DP/PT/radial pulses 2+ bilaterally MS- no significant deformity or atrophy Skin- warm and dry, no rash or lesion Psych- euthymic mood, full affect Neuro- strength and sensation are intact  EKG {ACTION; IS/IS HYI:[id number]} ordered. Personal review of EKG from {Blank single:19197::"today","***"} shows ***  Additional studies reviewed include: Previous EP office notes. ***  Assessment and Plan:  1. Palpitations 2. PVCs EKG today shows *** Continue flecainide 50 mg BID  3. HTN Stable on current regimen   Follow up with {Blank single:19197::"Dr. Allred","Dr. Arlan Organ. Klein","Dr. Camnitz","Dr. Lambert","EP APP"} in {Blank single:19197::"2 weeks","4 weeks","3 months","6 months","12 months","as usual post gen change"}   Shirley Friar, PA-C  07/21/21 11:04 AM

## 2021-07-27 ENCOUNTER — Ambulatory Visit: Payer: Medicare Other | Admitting: Student

## 2021-07-27 DIAGNOSIS — I10 Essential (primary) hypertension: Secondary | ICD-10-CM

## 2021-07-27 DIAGNOSIS — I493 Ventricular premature depolarization: Secondary | ICD-10-CM

## 2021-07-30 ENCOUNTER — Other Ambulatory Visit: Payer: Self-pay

## 2021-07-30 DIAGNOSIS — I493 Ventricular premature depolarization: Secondary | ICD-10-CM

## 2021-07-30 MED ORDER — VERAPAMIL HCL ER 120 MG PO TBCR: 120.0000 mg | EXTENDED_RELEASE_TABLET | Freq: Every day | ORAL | 1 refills | Status: DC

## 2021-07-30 MED ORDER — FLECAINIDE ACETATE 50 MG PO TABS: ORAL_TABLET | ORAL | 1 refills | Status: DC

## 2021-08-03 ENCOUNTER — Other Ambulatory Visit: Payer: Self-pay

## 2021-08-03 DIAGNOSIS — I493 Ventricular premature depolarization: Secondary | ICD-10-CM

## 2021-08-03 MED ORDER — VERAPAMIL HCL ER 120 MG PO TBCR: 120.0000 mg | EXTENDED_RELEASE_TABLET | Freq: Every day | ORAL | 2 refills | Status: DC

## 2021-08-03 NOTE — Addendum Note (Signed)
Addended by: Carter Kitten D on: 08/03/2021 10:37 AM   Modules accepted: Orders

## 2021-08-06 DIAGNOSIS — M259 Joint disorder, unspecified: Secondary | ICD-10-CM | POA: Diagnosis not present

## 2021-08-06 DIAGNOSIS — M25551 Pain in right hip: Secondary | ICD-10-CM | POA: Diagnosis not present

## 2021-08-07 DIAGNOSIS — M533 Sacrococcygeal disorders, not elsewhere classified: Secondary | ICD-10-CM | POA: Insufficient documentation

## 2021-08-12 DIAGNOSIS — M25551 Pain in right hip: Secondary | ICD-10-CM | POA: Insufficient documentation

## 2021-08-18 DIAGNOSIS — M533 Sacrococcygeal disorders, not elsewhere classified: Secondary | ICD-10-CM | POA: Diagnosis not present

## 2021-08-19 DIAGNOSIS — H26492 Other secondary cataract, left eye: Secondary | ICD-10-CM | POA: Diagnosis not present

## 2021-09-14 ENCOUNTER — Ambulatory Visit (INDEPENDENT_AMBULATORY_CARE_PROVIDER_SITE_OTHER): Payer: Medicare Other | Admitting: Podiatry

## 2021-09-14 DIAGNOSIS — M7752 Other enthesopathy of left foot: Secondary | ICD-10-CM | POA: Diagnosis not present

## 2021-09-14 DIAGNOSIS — M7751 Other enthesopathy of right foot: Secondary | ICD-10-CM | POA: Diagnosis not present

## 2021-09-14 MED ORDER — DEXAMETHASONE SODIUM PHOSPHATE 120 MG/30ML IJ SOLN
2.0000 mg | Freq: Once | INTRAMUSCULAR | Status: AC
  Administered 2021-09-14: 2 mg via INTRA_ARTICULAR

## 2021-09-14 MED ORDER — TRIAMCINOLONE ACETONIDE 40 MG/ML IJ SUSP
20.0000 mg | Freq: Once | INTRAMUSCULAR | Status: AC
  Administered 2021-09-14: 20 mg

## 2021-09-14 NOTE — Progress Notes (Signed)
She presents today chief concern of pain to the right ankle as she points to the subtalar joint area of the right foot and then pain some fifth metatarsal head on the left foot.  She states that this lasted for quite some time but seems to come back.  Objective: Vital signs are stable she is alert oriented x3 pulses are palpable.  She has severe fat pad atrophy with some fluctuance beneath the fifth metatarsal head of the left foot consistent with bursitis.  Is warm to the touch.  She also has pain on palpation of the sinus tarsi of the right foot and end range of motion of the subtalar joint of the right foot.  Assessment: Subtalar joint capsulitis right and bursitis of fifth metatarsal head left foot.  Plan: Injected the subtalar joint 10 mg Kenalog 5 mg Marcaine point maximal tenderness to the right foot.  I also injected 2 mg of dexamethasone beneath the fifth metatarsal head of the left foot.  Tolerated procedure well I will follow-up with her on an as-needed basis.

## 2021-09-16 ENCOUNTER — Other Ambulatory Visit: Payer: Self-pay

## 2021-09-16 DIAGNOSIS — I493 Ventricular premature depolarization: Secondary | ICD-10-CM

## 2021-09-16 MED ORDER — FLECAINIDE ACETATE 50 MG PO TABS: ORAL_TABLET | ORAL | 0 refills | Status: DC

## 2021-09-17 IMAGING — MR MR FOOT*L* W/O CM
4 of 5 series · 17 of 40 positions shown · non-contrast
Comparison: X-ray 09/25/2019

CLINICAL DATA: Pain at the plantar aspect of the second
metatarsophalangeal joint.

EXAM:
MRI OF THE LEFT FOOT WITHOUT CONTRAST
TECHNIQUE: Multiplanar, multisequence MR imaging of the left forefoot was
performed. No intravenous contrast was administered.

[Series 4: T1 · coronal · 3.0mm · 0.19mm/px · 3 of 44 slices shown (1 of 2)]
[im 5/44]
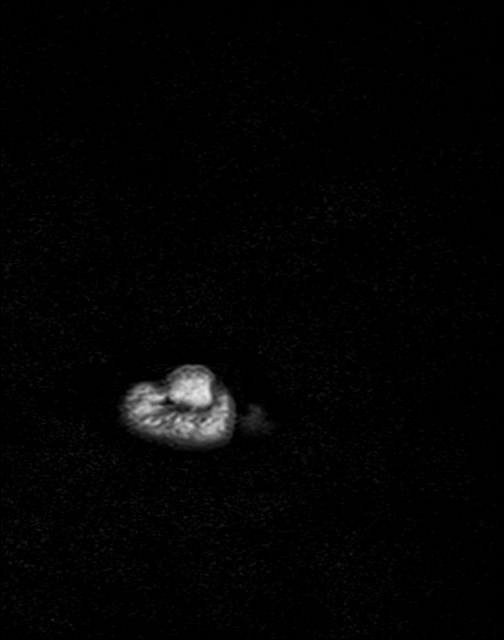
[im 22/44]
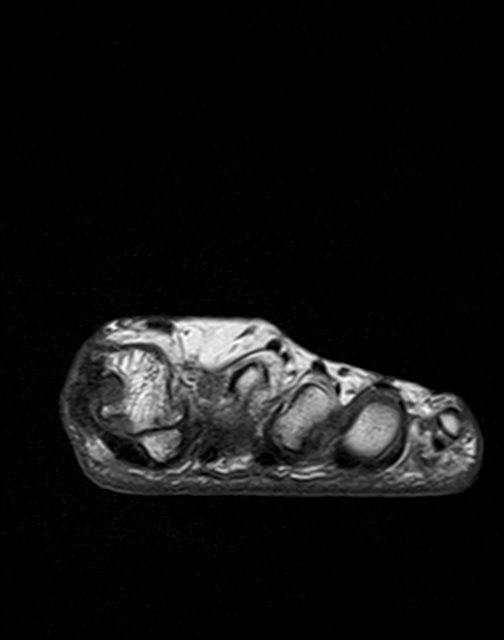
[im 39/44]
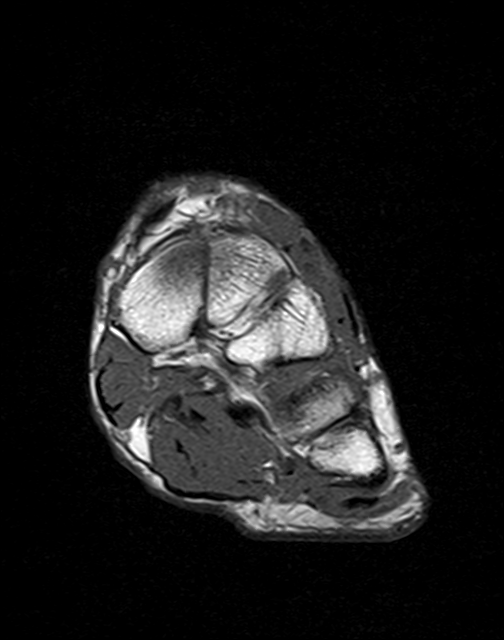

[Series 5: T2 fat-sat · coronal · 3.0mm · 0.19mm/px · 8 of 44 slices shown (1 of 2)]
[im 1/44]
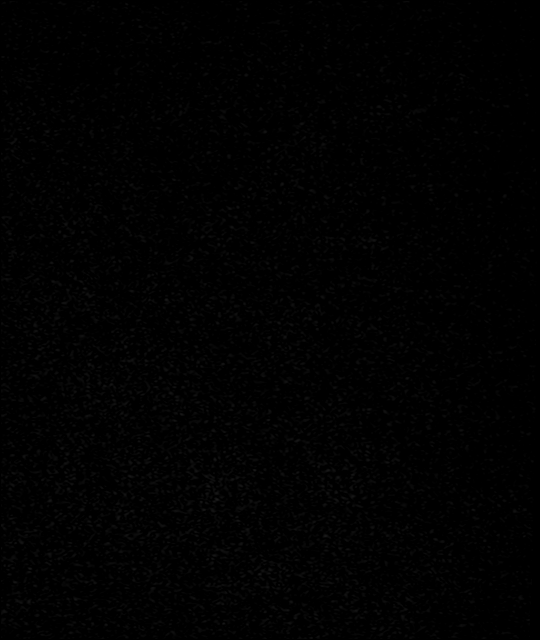
[im 5/44]
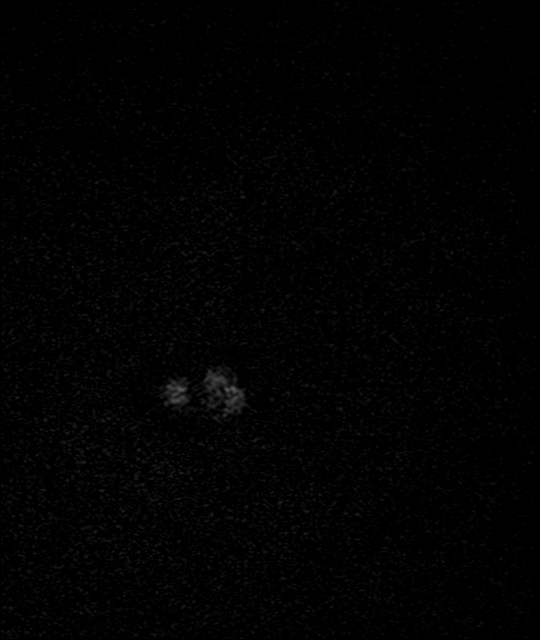
[im 9/44]
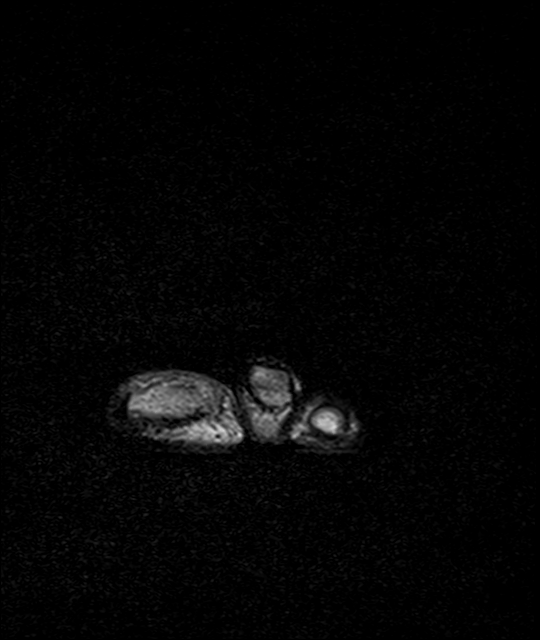
[im 13/44]
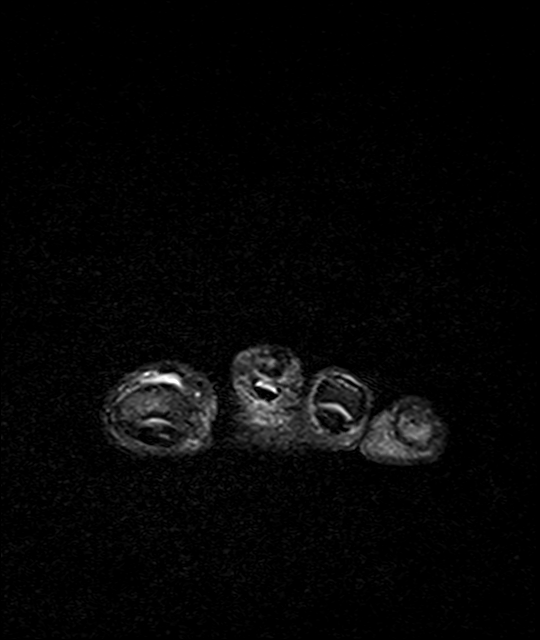
[im 18/44]
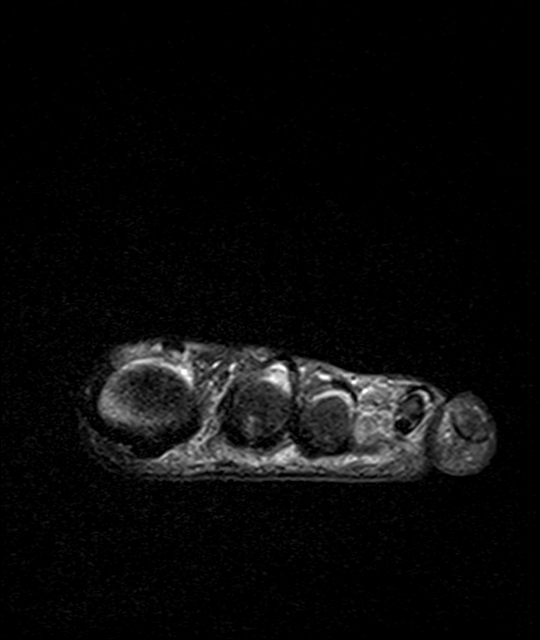
[im 22/44]
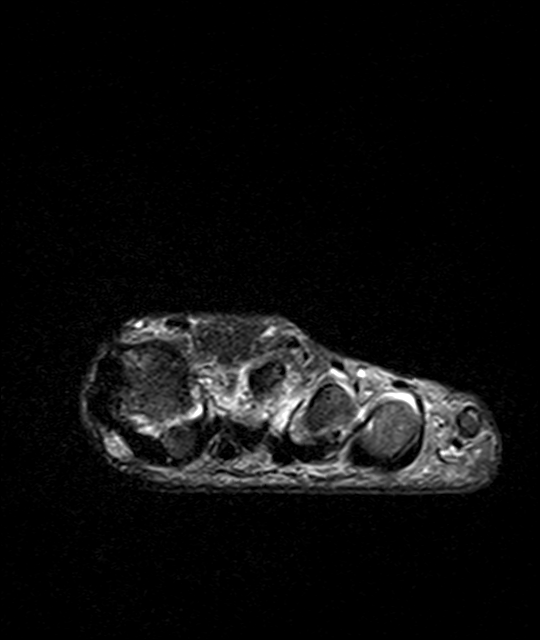
[im 26/44]
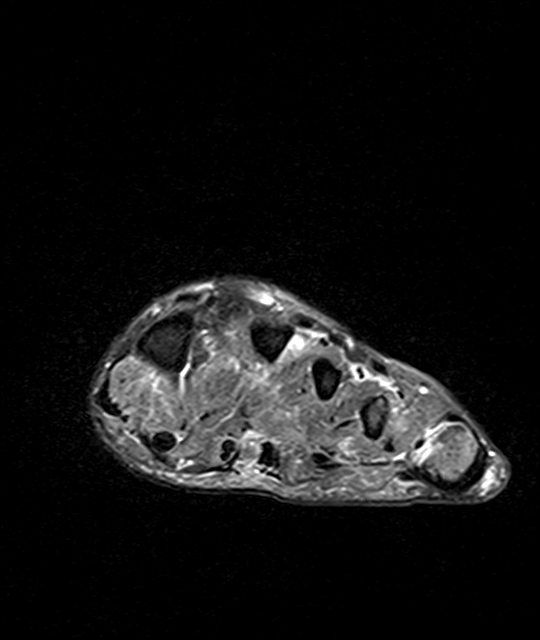
[im 39/44]
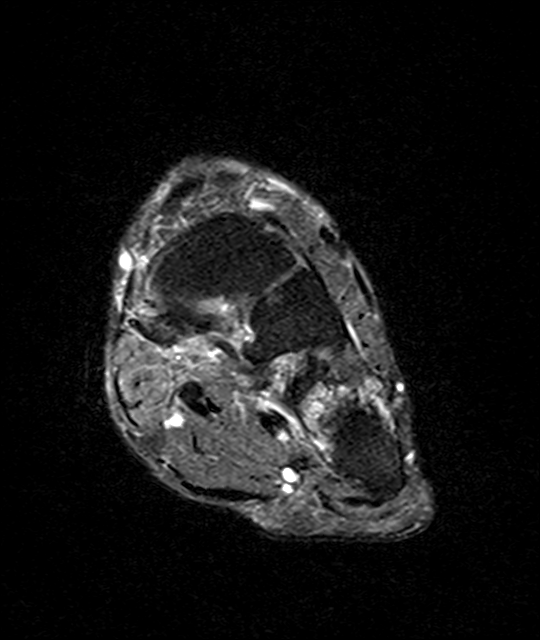

[Series 8: T1 · axial · 3.0mm · 0.35mm/px · z∈[-74,-9]mm · 3 of 22 slices shown (2 of 2)]
[im 5/22]
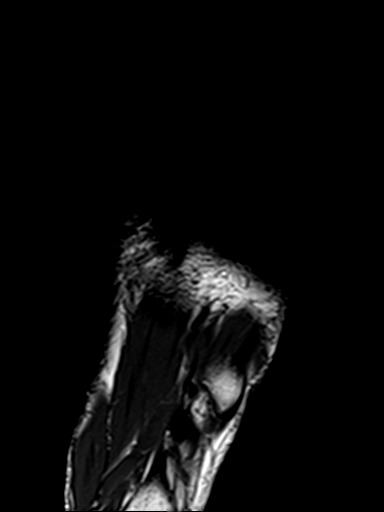
[im 13/22]
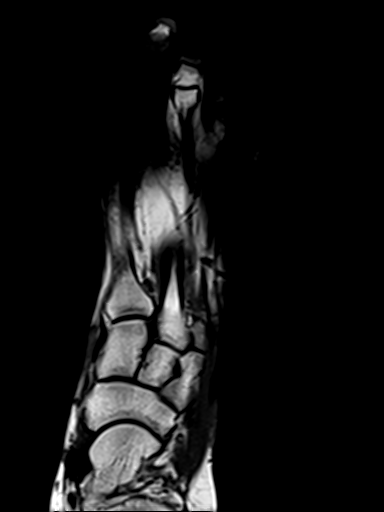
[im 22/22]
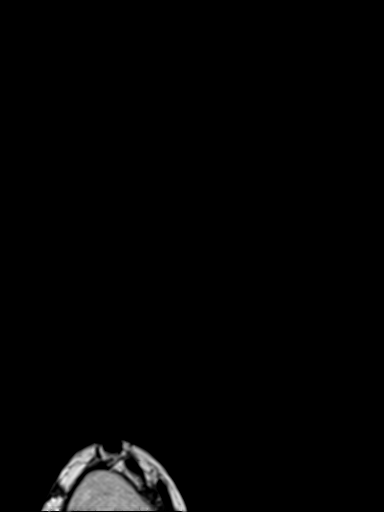

[Series 9: T2 fat-sat · axial · 3.0mm · 0.35mm/px · z∈[-74,-9]mm · 3 of 22 slices shown (2 of 2)]
[im 5/22]
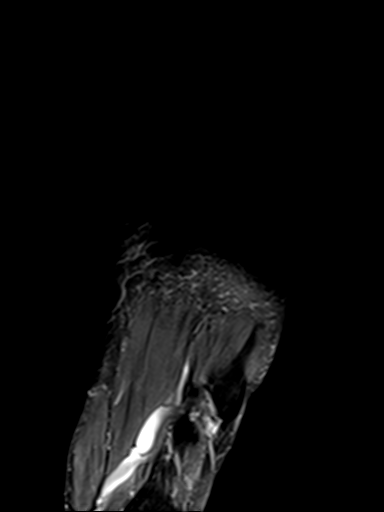
[im 13/22]
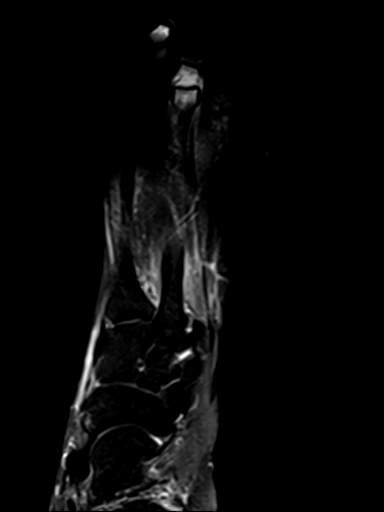
[im 22/22]
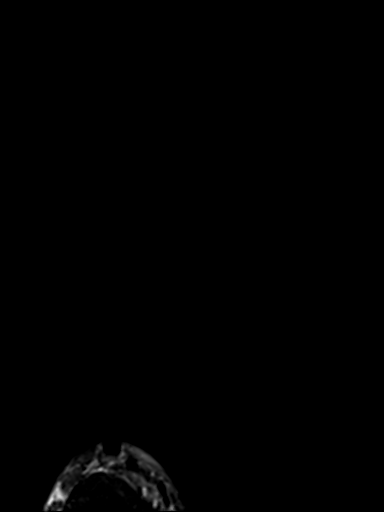

[17 of 40 positions shown; findings below may reference images not displayed]

FINDINGS: Bones/Joint/Cartilage

No acute fracture. No dislocation. Relatively increased T2 signal
within the fifth metatarsal head and neck is favored artifactual
secondary to poor fat saturation at the edge of the field of view
given the normal appearance of the marrow on sagittal STIR images
through this location. Moderate to severe osteoarthritis of the
fourth tarsometatarsal joint with prominent reactive subchondral
marrow signal changes within the cuboid. Mild osteoarthritis within
the fifth TMT joint. Well-defined erosive changes along the medial
aspect of the first metatarsal head (series 8, image 13) suggesting
sequela of a crystalline arthropathy such as gout. No adjacent soft
tissue mass.

Ligaments

Intact Lisfranc ligament. Collateral ligaments of the forefoot
appear intact. There is capsular thickening of the second MTP joint.
No evidence of plantar plate disruption on large field-of-view
sequences.

Muscles and Tendons

Intact flexor and extensor tendons without evidence of tear or
tenosynovitis. Normal muscle bulk and signal intensity without
edema, atrophy, or fatty infiltration.

Soft tissues

No intermetatarsal bursal space fluid collection or mass. No soft
tissue edema or fluid collection. No ulceration.
IMPRESSION: 1. Capsular thickening of the second MTP joint suggesting a
nonspecific capsulitis.
2. No evidence of plantar plate disruption.
3. Moderate-to-severe osteoarthritis of the fourth tarsometatarsal
joint.
4. Well-defined erosive changes along the medial aspect of the first
metatarsal head suggesting sequela of a crystalline arthropathy such
as gout.

## 2021-09-21 DIAGNOSIS — H26492 Other secondary cataract, left eye: Secondary | ICD-10-CM | POA: Diagnosis not present

## 2021-09-21 DIAGNOSIS — H16223 Keratoconjunctivitis sicca, not specified as Sjogren's, bilateral: Secondary | ICD-10-CM | POA: Diagnosis not present

## 2021-09-25 ENCOUNTER — Ambulatory Visit: Payer: Medicare Other | Admitting: Internal Medicine

## 2021-09-29 ENCOUNTER — Encounter: Payer: Self-pay | Admitting: Internal Medicine

## 2021-09-29 ENCOUNTER — Ambulatory Visit (INDEPENDENT_AMBULATORY_CARE_PROVIDER_SITE_OTHER): Payer: Medicare Other | Admitting: Internal Medicine

## 2021-09-29 VITALS — BP 120/60 | HR 49 | Ht 59.0 in | Wt 98.2 lb

## 2021-09-29 DIAGNOSIS — I10 Essential (primary) hypertension: Secondary | ICD-10-CM

## 2021-09-29 DIAGNOSIS — I493 Ventricular premature depolarization: Secondary | ICD-10-CM | POA: Diagnosis not present

## 2021-09-29 MED ORDER — VERAPAMIL HCL ER 120 MG PO TBCR: 60.0000 mg | EXTENDED_RELEASE_TABLET | Freq: Every day | ORAL | 2 refills | Status: DC

## 2021-09-29 NOTE — Progress Notes (Signed)
HPI Kirsten [name] returns today for followup. She is a pleasant 81 yo woman with a h/o palpitations who has been controlled on low dose flecainide. She has been under increased stress as her ex partner died suddenly. She has lost 40 lbs since her last visit as she is dieting.   No Known Allergies   Current Outpatient Medications  Medication Sig Dispense Refill   albuterol (VENTOLIN HFA) 108 (90 Base) MCG/ACT inhaler Inhale 2 puffs into the lungs every 6 (six) hours as needed for wheezing.     aspirin 81 MG tablet Take 81 mg by mouth daily.     beclomethasone (QVAR REDIHALER) 40 MCG/ACT inhaler Inhale 2 puffs into the lungs 2 (two) times daily. 1 Inhaler 5   beclomethasone (QVAR) 80 MCG/ACT inhaler Inhale 2 puffs into the lungs 2 (two) times daily. 1 Inhaler 5   Biotin 5000 MCG TABS Take 5,000 mcg by mouth daily. \     CINNAMON PO Take 1,000 mg by mouth daily.     estrogens, conjugated, (PREMARIN) 0.9 MG tablet Take 0.9 mg by mouth as directed. Take daily for 21 days then do not take for 7 days.     flecainide (TAMBOCOR) 50 MG tablet Take 1 tablet by mouth twice a day. May take 1 additional tablet by mouth as needed for fast heart rate. 75 tablet 0   Glucosamine-Chondroitin (OSTEO BI-FLEX REGULAR STRENGTH PO) Take 1 tablet by mouth daily.     hydrochlorothiazide (HYDRODIURIL) 25 MG tablet Take 1 tablet by mouth daily.     Misc Natural Products (OSTEO BI-FLEX JOINT SHIELD PO) Take 1 capsule by mouth daily.      ondansetron (ZOFRAN) 4 MG tablet Take 1 tablet (4 mg total) by mouth every 8 (eight) hours as needed. 20 tablet 0   primidone (MYSOLINE) 50 MG tablet Take 50 mg by mouth daily.     rosuvastatin (CRESTOR) 10 MG tablet Take 1 tablet by mouth daily.     Sodium Fluoride 1.1 % PSTE Place onto teeth as directed.      SYNTHROID 75 MCG tablet Take 1 tablet by mouth daily.     tiotropium (SPIRIVA HANDIHALER) 18 MCG inhalation capsule PLACE 1 CAPSULE INTO INHALER AND INHALE DAILY. 90 capsule  4   verapamil (CALAN-SR) 120 MG CR tablet Take 1 tablet (120 mg total) by mouth at bedtime. 30 tablet 2   Vitamin D, Ergocalciferol, (DRISDOL) 50000 UNITS CAPS Take 50,000 Units by mouth. Twice a month     zolpidem (AMBIEN) 10 MG tablet Take 10 mg by mouth at bedtime as needed for sleep.      No current facility-administered medications for this visit.     Past Medical History:  Diagnosis Date   Allergy    occasional seasonal   Asthma    COPD (chronic obstructive pulmonary disease) ([facility])    Emphysema of lung ([place])    H/O: hysterectomy    Heart murmur    HTN (hypertension)    HTN (hypertension)    IGT (impaired glucose tolerance)    Irregular heart rate    take Flecinade   OSA (obstructive sleep apnea) 12/08/2017   Osteopenia    Palpitations    Thyroid disease    hypothyroid   Tinnitus    Tremor, essential    Vertigo     ROS:   All systems reviewed and negative except as noted in the HPI.   Past Surgical History:  Procedure Laterality Date  CATARACT EXTRACTION     both eyes- February 2015   orthopedic procedure     both feet   TRIGGER FINGER RELEASE     right hand     Family History  Problem Relation Age of Onset   Kidney disease Mother    Cirrhosis Mother    Lung cancer Father    Heart disease Maternal Grandmother    Colon cancer Neg Hx    Esophageal cancer Neg Hx    Rectal cancer Neg Hx    Stomach cancer Neg Hx    Pancreatic cancer Neg Hx      Social History   Socioeconomic History   Marital status: Divorced    Spouse name: Not on file   Number of children: 0   Years of education: Not on file   Highest education level: Not on file  Occupational History   Occupation: retired Estate agent for [employer]  Tobacco Use   Smoking status: Never   Smokeless tobacco: Never  Substance and Sexual Activity   Alcohol use: No   Drug use: No   Sexual activity: Not on file  Other Topics Concern   Not on file  Social History Narrative   Not  on file   Social Determinants of Health   Financial Resource Strain: Not on file  Food Insecurity: Not on file  Transportation Needs: Not on file  Physical Activity: Not on file  Stress: Not on file  Social Connections: Not on file  Intimate Partner Violence: Not on file     BP 120/60   Pulse (!) 49   Ht '4\' 11"'$  (1.499 m)   Wt 98 lb 3.2 oz (44.5 kg)   SpO2 95%   BMI 19.83 kg/m   Physical Exam:  Well appearing 81 yo woman, NAD HEENT: Unremarkable Neck:  No JVD, no thyromegally Lymphatics:  No adenopathy Back:  No CVA tenderness Lungs:  Clear with no wheezes HEART:  Regular rate rhythm, no murmurs, no rubs, no clicks Abd:  soft, positive bowel sounds, no organomegally, no rebound, no guarding Ext:  2 plus pulses, no edema, no cyanosis, no clubbing Skin:  No rashes no nodules Neuro:  CN II through XII intact, motor grossly intact  EKG - sinus brady at 49/min  Assess/Plan:  1. Palpitations - these are well controlled on low dose flecainide. She will continue. 2. PVC's - well controlled. We will follow. 3. HTN - her sbp is well controlled in the setting of weight loss and she is now a little dizzy and I have asked her to reduce her calan to 1/2 tab daily. 4. Weight gain - she has now lost 40 lbs. She will continue her current routine.   Kirsten [name] ,MD

## 2021-09-29 NOTE — Patient Instructions (Addendum)
Medication Instructions:  Your physician has recommended you make the following change in your medication:   MEDICATION CHANGE:   DECREASE- Verapamil 60 Mg-  Take HALF tablet by mouth daily at bedtime.    Lab Work: None ordered.  If you have labs (blood work) drawn today and your tests are completely normal, you will receive your results only by: [facility] (if you have MyChart) OR A paper copy in the mail If you have any lab test that is abnormal or we need to change your treatment, we will call you to review the results.  Testing/Procedures: None ordered.  Follow-Up: In 1 year with Dr. Lovena Le    We recommend signing up for the patient portal called "MyChart".  Sign up information is provided on this After Visit Summary.  MyChart is used to connect with patients for Virtual Visits (Telemedicine).  Patients are able to view lab/test results, encounter notes, upcoming appointments, etc.  Non-urgent messages can be sent to your provider as well.   To learn more about what you can do with MyChart, go to [email or web address].     Provider:   Cristopher Peru, MD{or one of the following Advanced Practice Providers on your designated Care Team:   Tommye Standard, [place] Legrand Como "Jonni Sanger" Chalmers Cater, [place]    Important Information About Sugar

## 2021-10-07 DIAGNOSIS — L309 Dermatitis, unspecified: Secondary | ICD-10-CM | POA: Diagnosis not present

## 2021-10-07 DIAGNOSIS — L298 Other pruritus: Secondary | ICD-10-CM | POA: Diagnosis not present

## 2021-10-15 DIAGNOSIS — N819 Female genital prolapse, unspecified: Secondary | ICD-10-CM | POA: Insufficient documentation

## 2021-10-15 DIAGNOSIS — R198 Other specified symptoms and signs involving the digestive system and abdomen: Secondary | ICD-10-CM | POA: Diagnosis not present

## 2021-10-15 DIAGNOSIS — N3946 Mixed incontinence: Secondary | ICD-10-CM | POA: Insufficient documentation

## 2021-10-19 ENCOUNTER — Other Ambulatory Visit: Payer: Self-pay | Admitting: Internal Medicine

## 2021-10-19 DIAGNOSIS — Z1231 Encounter for screening mammogram for malignant neoplasm of breast: Secondary | ICD-10-CM

## 2021-11-16 DIAGNOSIS — H16223 Keratoconjunctivitis sicca, not specified as Sjogren's, bilateral: Secondary | ICD-10-CM | POA: Diagnosis not present

## 2021-11-16 DIAGNOSIS — Z961 Presence of intraocular lens: Secondary | ICD-10-CM | POA: Diagnosis not present

## 2021-11-16 DIAGNOSIS — H18413 Arcus senilis, bilateral: Secondary | ICD-10-CM | POA: Diagnosis not present

## 2021-11-16 DIAGNOSIS — H26491 Other secondary cataract, right eye: Secondary | ICD-10-CM | POA: Diagnosis not present

## 2021-11-16 DIAGNOSIS — I10 Essential (primary) hypertension: Secondary | ICD-10-CM | POA: Diagnosis not present

## 2021-11-25 DIAGNOSIS — M533 Sacrococcygeal disorders, not elsewhere classified: Secondary | ICD-10-CM | POA: Diagnosis not present

## 2021-11-30 DIAGNOSIS — H26492 Other secondary cataract, left eye: Secondary | ICD-10-CM | POA: Diagnosis not present

## 2021-12-03 DIAGNOSIS — Z90721 Acquired absence of ovaries, unilateral: Secondary | ICD-10-CM | POA: Diagnosis not present

## 2021-12-03 DIAGNOSIS — Z7982 Long term (current) use of aspirin: Secondary | ICD-10-CM | POA: Diagnosis not present

## 2021-12-03 DIAGNOSIS — Z79899 Other long term (current) drug therapy: Secondary | ICD-10-CM | POA: Diagnosis not present

## 2021-12-03 DIAGNOSIS — G250 Essential tremor: Secondary | ICD-10-CM | POA: Diagnosis not present

## 2021-12-03 DIAGNOSIS — N8110 Cystocele, unspecified: Secondary | ICD-10-CM | POA: Diagnosis not present

## 2021-12-03 DIAGNOSIS — N812 Incomplete uterovaginal prolapse: Secondary | ICD-10-CM | POA: Diagnosis not present

## 2021-12-03 DIAGNOSIS — N736 Female pelvic peritoneal adhesions (postinfective): Secondary | ICD-10-CM | POA: Diagnosis not present

## 2021-12-03 DIAGNOSIS — Z90710 Acquired absence of both cervix and uterus: Secondary | ICD-10-CM | POA: Diagnosis not present

## 2021-12-03 DIAGNOSIS — E785 Hyperlipidemia, unspecified: Secondary | ICD-10-CM | POA: Diagnosis not present

## 2021-12-03 DIAGNOSIS — I493 Ventricular premature depolarization: Secondary | ICD-10-CM | POA: Diagnosis not present

## 2021-12-03 DIAGNOSIS — I10 Essential (primary) hypertension: Secondary | ICD-10-CM | POA: Diagnosis not present

## 2021-12-03 DIAGNOSIS — R194 Change in bowel habit: Secondary | ICD-10-CM | POA: Diagnosis not present

## 2021-12-03 DIAGNOSIS — N3289 Other specified disorders of bladder: Secondary | ICD-10-CM | POA: Diagnosis not present

## 2021-12-03 DIAGNOSIS — N3946 Mixed incontinence: Secondary | ICD-10-CM | POA: Diagnosis not present

## 2021-12-03 DIAGNOSIS — J449 Chronic obstructive pulmonary disease, unspecified: Secondary | ICD-10-CM | POA: Diagnosis not present

## 2021-12-03 DIAGNOSIS — R195 Other fecal abnormalities: Secondary | ICD-10-CM | POA: Diagnosis not present

## 2021-12-03 DIAGNOSIS — N819 Female genital prolapse, unspecified: Secondary | ICD-10-CM | POA: Diagnosis not present

## 2021-12-03 DIAGNOSIS — K5902 Outlet dysfunction constipation: Secondary | ICD-10-CM | POA: Diagnosis not present

## 2021-12-03 DIAGNOSIS — Z79890 Hormone replacement therapy (postmenopausal): Secondary | ICD-10-CM | POA: Diagnosis not present

## 2021-12-03 DIAGNOSIS — E039 Hypothyroidism, unspecified: Secondary | ICD-10-CM | POA: Diagnosis not present

## 2021-12-04 DIAGNOSIS — N819 Female genital prolapse, unspecified: Secondary | ICD-10-CM | POA: Diagnosis not present

## 2021-12-04 DIAGNOSIS — N3946 Mixed incontinence: Secondary | ICD-10-CM | POA: Diagnosis not present

## 2021-12-04 DIAGNOSIS — N3289 Other specified disorders of bladder: Secondary | ICD-10-CM | POA: Diagnosis not present

## 2021-12-04 DIAGNOSIS — N736 Female pelvic peritoneal adhesions (postinfective): Secondary | ICD-10-CM | POA: Diagnosis not present

## 2021-12-04 DIAGNOSIS — N8110 Cystocele, unspecified: Secondary | ICD-10-CM | POA: Diagnosis not present

## 2021-12-04 DIAGNOSIS — Z90710 Acquired absence of both cervix and uterus: Secondary | ICD-10-CM | POA: Diagnosis not present

## 2021-12-07 DIAGNOSIS — R918 Other nonspecific abnormal finding of lung field: Secondary | ICD-10-CM | POA: Diagnosis not present

## 2021-12-07 DIAGNOSIS — K565 Intestinal adhesions [bands], unspecified as to partial versus complete obstruction: Secondary | ICD-10-CM | POA: Diagnosis not present

## 2021-12-07 DIAGNOSIS — R002 Palpitations: Secondary | ICD-10-CM | POA: Diagnosis not present

## 2021-12-07 DIAGNOSIS — Z90710 Acquired absence of both cervix and uterus: Secondary | ICD-10-CM | POA: Diagnosis not present

## 2021-12-07 DIAGNOSIS — R112 Nausea with vomiting, unspecified: Secondary | ICD-10-CM | POA: Diagnosis not present

## 2021-12-07 DIAGNOSIS — I10 Essential (primary) hypertension: Secondary | ICD-10-CM | POA: Diagnosis not present

## 2021-12-07 DIAGNOSIS — J4489 Other specified chronic obstructive pulmonary disease: Secondary | ICD-10-CM | POA: Diagnosis not present

## 2021-12-07 DIAGNOSIS — K5651 Intestinal adhesions [bands], with partial obstruction: Secondary | ICD-10-CM | POA: Diagnosis not present

## 2021-12-07 DIAGNOSIS — J690 Pneumonitis due to inhalation of food and vomit: Secondary | ICD-10-CM | POA: Diagnosis not present

## 2021-12-07 DIAGNOSIS — K668 Other specified disorders of peritoneum: Secondary | ICD-10-CM | POA: Diagnosis not present

## 2021-12-07 DIAGNOSIS — Z66 Do not resuscitate: Secondary | ICD-10-CM | POA: Diagnosis not present

## 2021-12-07 DIAGNOSIS — K56600 Partial intestinal obstruction, unspecified as to cause: Secondary | ICD-10-CM | POA: Diagnosis not present

## 2021-12-07 DIAGNOSIS — K631 Perforation of intestine (nontraumatic): Secondary | ICD-10-CM | POA: Diagnosis not present

## 2021-12-07 DIAGNOSIS — G250 Essential tremor: Secondary | ICD-10-CM | POA: Diagnosis not present

## 2021-12-07 DIAGNOSIS — R1084 Generalized abdominal pain: Secondary | ICD-10-CM | POA: Diagnosis not present

## 2021-12-07 DIAGNOSIS — K310 Acute dilatation of stomach: Secondary | ICD-10-CM | POA: Diagnosis not present

## 2021-12-07 DIAGNOSIS — E039 Hypothyroidism, unspecified: Secondary | ICD-10-CM | POA: Diagnosis not present

## 2021-12-07 DIAGNOSIS — K660 Peritoneal adhesions (postprocedural) (postinfection): Secondary | ICD-10-CM | POA: Diagnosis not present

## 2021-12-07 DIAGNOSIS — Z4659 Encounter for fitting and adjustment of other gastrointestinal appliance and device: Secondary | ICD-10-CM | POA: Diagnosis not present

## 2021-12-07 DIAGNOSIS — Z7982 Long term (current) use of aspirin: Secondary | ICD-10-CM | POA: Diagnosis not present

## 2021-12-07 DIAGNOSIS — J449 Chronic obstructive pulmonary disease, unspecified: Secondary | ICD-10-CM | POA: Diagnosis not present

## 2021-12-07 DIAGNOSIS — Z79899 Other long term (current) drug therapy: Secondary | ICD-10-CM | POA: Diagnosis not present

## 2021-12-07 DIAGNOSIS — Z79890 Hormone replacement therapy (postmenopausal): Secondary | ICD-10-CM | POA: Diagnosis not present

## 2021-12-07 DIAGNOSIS — R0902 Hypoxemia: Secondary | ICD-10-CM | POA: Diagnosis not present

## 2021-12-09 DIAGNOSIS — K310 Acute dilatation of stomach: Secondary | ICD-10-CM | POA: Diagnosis not present

## 2021-12-09 DIAGNOSIS — Z4659 Encounter for fitting and adjustment of other gastrointestinal appliance and device: Secondary | ICD-10-CM | POA: Diagnosis not present

## 2021-12-09 DIAGNOSIS — K631 Perforation of intestine (nontraumatic): Secondary | ICD-10-CM | POA: Diagnosis not present

## 2021-12-22 DIAGNOSIS — Z4889 Encounter for other specified surgical aftercare: Secondary | ICD-10-CM | POA: Diagnosis not present

## 2021-12-30 DIAGNOSIS — Z8719 Personal history of other diseases of the digestive system: Secondary | ICD-10-CM | POA: Diagnosis not present

## 2021-12-30 DIAGNOSIS — I10 Essential (primary) hypertension: Secondary | ICD-10-CM | POA: Diagnosis not present

## 2021-12-30 DIAGNOSIS — E039 Hypothyroidism, unspecified: Secondary | ICD-10-CM | POA: Diagnosis not present

## 2021-12-30 DIAGNOSIS — K631 Perforation of intestine (nontraumatic): Secondary | ICD-10-CM | POA: Diagnosis not present

## 2021-12-30 DIAGNOSIS — E440 Moderate protein-calorie malnutrition: Secondary | ICD-10-CM | POA: Diagnosis not present

## 2022-01-04 ENCOUNTER — Ambulatory Visit: Payer: Medicare Other

## 2022-01-05 ENCOUNTER — Ambulatory Visit: Payer: Medicare Other | Admitting: Podiatry

## 2022-01-15 ENCOUNTER — Other Ambulatory Visit: Payer: Self-pay | Admitting: Internal Medicine

## 2022-01-15 DIAGNOSIS — I493 Ventricular premature depolarization: Secondary | ICD-10-CM

## 2022-01-21 DIAGNOSIS — Z48816 Encounter for surgical aftercare following surgery on the genitourinary system: Secondary | ICD-10-CM | POA: Diagnosis not present

## 2022-01-27 DIAGNOSIS — M79642 Pain in left hand: Secondary | ICD-10-CM | POA: Diagnosis not present

## 2022-02-23 ENCOUNTER — Other Ambulatory Visit: Payer: Self-pay | Admitting: Internal Medicine

## 2022-02-23 DIAGNOSIS — I493 Ventricular premature depolarization: Secondary | ICD-10-CM

## 2022-02-25 DIAGNOSIS — Z1331 Encounter for screening for depression: Secondary | ICD-10-CM | POA: Diagnosis not present

## 2022-02-25 DIAGNOSIS — E440 Moderate protein-calorie malnutrition: Secondary | ICD-10-CM | POA: Diagnosis not present

## 2022-02-25 DIAGNOSIS — G250 Essential tremor: Secondary | ICD-10-CM | POA: Diagnosis not present

## 2022-02-25 DIAGNOSIS — M8580 Other specified disorders of bone density and structure, unspecified site: Secondary | ICD-10-CM | POA: Diagnosis not present

## 2022-02-25 DIAGNOSIS — R7302 Impaired glucose tolerance (oral): Secondary | ICD-10-CM | POA: Diagnosis not present

## 2022-02-25 DIAGNOSIS — Z8719 Personal history of other diseases of the digestive system: Secondary | ICD-10-CM | POA: Diagnosis not present

## 2022-02-25 DIAGNOSIS — E039 Hypothyroidism, unspecified: Secondary | ICD-10-CM | POA: Diagnosis not present

## 2022-02-25 DIAGNOSIS — I10 Essential (primary) hypertension: Secondary | ICD-10-CM | POA: Diagnosis not present

## 2022-02-25 DIAGNOSIS — E785 Hyperlipidemia, unspecified: Secondary | ICD-10-CM | POA: Diagnosis not present

## 2022-02-25 DIAGNOSIS — I700 Atherosclerosis of aorta: Secondary | ICD-10-CM | POA: Diagnosis not present

## 2022-02-25 DIAGNOSIS — Z1339 Encounter for screening examination for other mental health and behavioral disorders: Secondary | ICD-10-CM | POA: Diagnosis not present

## 2022-02-25 DIAGNOSIS — Z23 Encounter for immunization: Secondary | ICD-10-CM | POA: Diagnosis not present

## 2022-02-25 DIAGNOSIS — Z0000 Encounter for general adult medical examination without abnormal findings: Secondary | ICD-10-CM | POA: Diagnosis not present

## 2022-03-02 ENCOUNTER — Ambulatory Visit (INDEPENDENT_AMBULATORY_CARE_PROVIDER_SITE_OTHER): Payer: Medicare Other | Admitting: Podiatry

## 2022-03-02 ENCOUNTER — Encounter: Payer: Self-pay | Admitting: Podiatry

## 2022-03-02 VITALS — BP 151/61 | HR 63

## 2022-03-02 DIAGNOSIS — D2372 Other benign neoplasm of skin of left lower limb, including hip: Secondary | ICD-10-CM | POA: Diagnosis not present

## 2022-03-02 DIAGNOSIS — E569 Vitamin deficiency, unspecified: Secondary | ICD-10-CM

## 2022-03-02 DIAGNOSIS — M79676 Pain in unspecified toe(s): Secondary | ICD-10-CM

## 2022-03-02 DIAGNOSIS — B351 Tinea unguium: Secondary | ICD-10-CM | POA: Diagnosis not present

## 2022-03-02 DIAGNOSIS — M7752 Other enthesopathy of left foot: Secondary | ICD-10-CM

## 2022-03-02 MED ORDER — DEXAMETHASONE SODIUM PHOSPHATE 120 MG/30ML IJ SOLN
2.0000 mg | Freq: Once | INTRAMUSCULAR | Status: AC
  Administered 2022-03-02: 2 mg via INTRA_ARTICULAR

## 2022-03-02 NOTE — Progress Notes (Signed)
She presents today for follow-up of her bursitis subfifth metatarsal left foot.  Recently been in the hospital with pelvic floor reconstruction which resulted in a laceration of the colon and a 15-day stay in the hospital.  Objective: Vital signs stable alert and oriented x 3.  She has bursitis beneath the fifth metatarsal head of the left foot with an overlying benign skin lesion.  She also has a painful sharp incurvated toenail hallux left.  Assessment: Pain limb secondary to onychomycosis incurvation of the toenail.  With bursitis and a benign skin lesion subfifth met head left.  Plan: Debridement of benign skin lesion debridement of hallux nail left and injection of the bursa today beneath the fifth metatarsal head of the left foot.  Only 2 mg was injected and this was after sterile Betadine skin prep and a Band-Aid was placed

## 2022-03-02 NOTE — Progress Notes (Signed)
151/61 63 

## 2022-03-29 ENCOUNTER — Other Ambulatory Visit: Payer: Self-pay | Admitting: Internal Medicine

## 2022-03-29 DIAGNOSIS — I493 Ventricular premature depolarization: Secondary | ICD-10-CM

## 2022-04-19 ENCOUNTER — Ambulatory Visit
Admission: RE | Admit: 2022-04-19 | Discharge: 2022-04-19 | Disposition: A | Discharge status: Home / Self Care | Payer: Medicare Other | Source: Ambulatory Visit | Attending: Internal Medicine | Admitting: Internal Medicine

## 2022-04-19 DIAGNOSIS — Z1231 Encounter for screening mammogram for malignant neoplasm of breast: Secondary | ICD-10-CM

## 2022-06-08 ENCOUNTER — Encounter: Payer: Self-pay | Admitting: Podiatry

## 2022-06-08 ENCOUNTER — Ambulatory Visit (INDEPENDENT_AMBULATORY_CARE_PROVIDER_SITE_OTHER): Payer: Medicare Other | Admitting: Podiatry

## 2022-06-08 DIAGNOSIS — D2372 Other benign neoplasm of skin of left lower limb, including hip: Secondary | ICD-10-CM | POA: Diagnosis not present

## 2022-06-08 NOTE — Progress Notes (Signed)
She presents today chief complaint of painful calluses.  She states that she has a new 1.  She is pointing to the subfirst metatarsal phalangeal joint.  Objective: Vitals are stable alert oriented x 3.  She has a porokeratotic lesion beneath the first metatarsophalangeal joint and the fifth on the left foot.  There is no fluctuance beneath the fifth metatarsal head today sign of bursitis.  Assessment: Porokeratosis benign skin lesions left foot subfirst subfifth met head.  Plan: Discussed etiology pathology conservative therapies at this point went ahead and debrided all reactive hyperkeratotic tissue placed padding under occlusion to be washed off thoroughly in 2 days since we did place Salinocaine.

## 2022-08-24 DIAGNOSIS — L821 Other seborrheic keratosis: Secondary | ICD-10-CM | POA: Diagnosis not present

## 2022-08-24 DIAGNOSIS — L218 Other seborrheic dermatitis: Secondary | ICD-10-CM | POA: Diagnosis not present

## 2022-08-24 DIAGNOSIS — L309 Dermatitis, unspecified: Secondary | ICD-10-CM | POA: Diagnosis not present

## 2022-08-24 DIAGNOSIS — L814 Other melanin hyperpigmentation: Secondary | ICD-10-CM | POA: Diagnosis not present

## 2022-08-24 DIAGNOSIS — L638 Other alopecia areata: Secondary | ICD-10-CM | POA: Diagnosis not present

## 2022-08-24 DIAGNOSIS — D225 Melanocytic nevi of trunk: Secondary | ICD-10-CM | POA: Diagnosis not present

## 2022-09-09 ENCOUNTER — Ambulatory Visit: Payer: Medicare Other | Admitting: Podiatry

## 2022-09-28 ENCOUNTER — Ambulatory Visit (INDEPENDENT_AMBULATORY_CARE_PROVIDER_SITE_OTHER): Payer: Medicare Other | Admitting: Podiatry

## 2022-09-28 DIAGNOSIS — D2372 Other benign neoplasm of skin of left lower limb, including hip: Secondary | ICD-10-CM | POA: Diagnosis not present

## 2022-09-28 DIAGNOSIS — M79676 Pain in unspecified toe(s): Secondary | ICD-10-CM

## 2022-09-28 DIAGNOSIS — M7752 Other enthesopathy of left foot: Secondary | ICD-10-CM | POA: Diagnosis not present

## 2022-09-28 DIAGNOSIS — B351 Tinea unguium: Secondary | ICD-10-CM | POA: Diagnosis not present

## 2022-09-28 MED ORDER — DEXAMETHASONE SODIUM PHOSPHATE 120 MG/30ML IJ SOLN
2.0000 mg | Freq: Once | INTRAMUSCULAR | Status: AC
Start: 2022-09-28 — End: 2022-09-28
  Administered 2022-09-28: 2 mg via INTRA_ARTICULAR

## 2022-09-28 NOTE — Progress Notes (Signed)
She presents today chief complaint of pain to her left foot she states that the lateral side is really painful on the bottom and she says the left hallux nail is painful.  Objective: Vital signs are stable oriented x 3.  Palpable bursa beneath fifth metatarsal head of the right foot with an overlying soft tissue lesion which appears to be benign in nature.  She does have a painful ingrown toenail to the hallux right.  Assessment: Pain secondary to hallux right ingrown nail and bursitis and benign skin lesion.  Plan: Discussed etiology pathology and surgical therapies at this point in time I debrided benign skin lesion I injected the bursa with 2 mg of dexamethasone and local anesthetic I also debrided a portion of the toenail.  She will follow-up with me as needed

## 2022-10-03 ENCOUNTER — Other Ambulatory Visit: Payer: Self-pay

## 2022-10-03 ENCOUNTER — Emergency Department (HOSPITAL_COMMUNITY): Payer: Medicare Other

## 2022-10-03 ENCOUNTER — Inpatient Hospital Stay (HOSPITAL_COMMUNITY)
Admission: EM | Admit: 2022-10-03 | Discharge: 2022-10-06 | DRG: 390 | Disposition: A | Discharge status: Home / Self Care | Payer: Medicare Other | Attending: Internal Medicine | Admitting: Internal Medicine

## 2022-10-03 DIAGNOSIS — Z801 Family history of malignant neoplasm of trachea, bronchus and lung: Secondary | ICD-10-CM

## 2022-10-03 DIAGNOSIS — R002 Palpitations: Secondary | ICD-10-CM | POA: Diagnosis not present

## 2022-10-03 DIAGNOSIS — J4489 Other specified chronic obstructive pulmonary disease: Secondary | ICD-10-CM | POA: Diagnosis present

## 2022-10-03 DIAGNOSIS — Z8249 Family history of ischemic heart disease and other diseases of the circulatory system: Secondary | ICD-10-CM | POA: Diagnosis not present

## 2022-10-03 DIAGNOSIS — Z7951 Long term (current) use of inhaled steroids: Secondary | ICD-10-CM | POA: Diagnosis not present

## 2022-10-03 DIAGNOSIS — Z79890 Hormone replacement therapy (postmenopausal): Secondary | ICD-10-CM

## 2022-10-03 DIAGNOSIS — G4733 Obstructive sleep apnea (adult) (pediatric): Secondary | ICD-10-CM | POA: Diagnosis not present

## 2022-10-03 DIAGNOSIS — Z7982 Long term (current) use of aspirin: Secondary | ICD-10-CM

## 2022-10-03 DIAGNOSIS — K56609 Unspecified intestinal obstruction, unspecified as to partial versus complete obstruction: Principal | ICD-10-CM | POA: Diagnosis present

## 2022-10-03 DIAGNOSIS — Z90710 Acquired absence of both cervix and uterus: Secondary | ICD-10-CM

## 2022-10-03 DIAGNOSIS — G250 Essential tremor: Secondary | ICD-10-CM | POA: Diagnosis present

## 2022-10-03 DIAGNOSIS — K5651 Intestinal adhesions [bands], with partial obstruction: Principal | ICD-10-CM | POA: Diagnosis present

## 2022-10-03 DIAGNOSIS — E785 Hyperlipidemia, unspecified: Secondary | ICD-10-CM | POA: Diagnosis present

## 2022-10-03 DIAGNOSIS — R109 Unspecified abdominal pain: Secondary | ICD-10-CM | POA: Diagnosis not present

## 2022-10-03 DIAGNOSIS — J439 Emphysema, unspecified: Secondary | ICD-10-CM | POA: Diagnosis present

## 2022-10-03 DIAGNOSIS — M8580 Other specified disorders of bone density and structure, unspecified site: Secondary | ICD-10-CM | POA: Diagnosis not present

## 2022-10-03 DIAGNOSIS — Z79899 Other long term (current) drug therapy: Secondary | ICD-10-CM

## 2022-10-03 DIAGNOSIS — Z841 Family history of disorders of kidney and ureter: Secondary | ICD-10-CM

## 2022-10-03 DIAGNOSIS — I10 Essential (primary) hypertension: Secondary | ICD-10-CM | POA: Diagnosis not present

## 2022-10-03 DIAGNOSIS — I493 Ventricular premature depolarization: Secondary | ICD-10-CM | POA: Diagnosis present

## 2022-10-03 DIAGNOSIS — Z66 Do not resuscitate: Secondary | ICD-10-CM | POA: Diagnosis not present

## 2022-10-03 DIAGNOSIS — E039 Hypothyroidism, unspecified: Secondary | ICD-10-CM | POA: Diagnosis present

## 2022-10-03 DIAGNOSIS — K30 Functional dyspepsia: Secondary | ICD-10-CM | POA: Diagnosis not present

## 2022-10-03 DIAGNOSIS — K5669 Other partial intestinal obstruction: Secondary | ICD-10-CM | POA: Diagnosis not present

## 2022-10-03 DIAGNOSIS — N281 Cyst of kidney, acquired: Secondary | ICD-10-CM | POA: Diagnosis not present

## 2022-10-03 LAB — COMPREHENSIVE METABOLIC PANEL
ALT: 13 U/L (ref 0–44)
AST: 17 U/L (ref 15–41)
Albumin: 3.5 g/dL (ref 3.5–5.0)
Alkaline Phosphatase: 81 U/L (ref 38–126)
Anion gap: 11 (ref 5–15)
BUN: 30 mg/dL — ABNORMAL HIGH (ref 8–23)
CO2: 22 mmol/L (ref 22–32)
Calcium: 9 mg/dL (ref 8.9–10.3)
Chloride: 101 mmol/L (ref 98–111)
Creatinine, Ser: 0.77 mg/dL (ref 0.44–1.00)
GFR, Estimated: >60 mL/min (ref >60)
Glucose, Bld: 83 mg/dL (ref 70–99)
Potassium: 3.7 mmol/L (ref 3.5–5.1)
Sodium: 134 mmol/L — ABNORMAL LOW (ref 135–145)
Total Bilirubin: 1 mg/dL (ref 0.3–1.2)
Total Protein: 6.9 g/dL (ref 6.5–8.1)

## 2022-10-03 LAB — URINALYSIS, ROUTINE W REFLEX MICROSCOPIC
Bilirubin Urine: NEGATIVE
Glucose, UA: NEGATIVE mg/dL
Hgb urine dipstick: NEGATIVE
Ketones, ur: 20 mg/dL — AB
Leukocytes,Ua: NEGATIVE
Nitrite: NEGATIVE
Protein, ur: NEGATIVE mg/dL
Specific Gravity, Urine: 1.026 (ref 1.005–1.030)
pH: 5 (ref 5.0–8.0)

## 2022-10-03 LAB — CBC
HCT: 44.5 % (ref 36.0–46.0)
Hemoglobin: 14.1 g/dL (ref 12.0–15.0)
MCH: 27.4 pg (ref 26.0–34.0)
MCHC: 31.7 g/dL (ref 30.0–36.0)
MCV: 86.4 fL (ref 80.0–100.0)
Platelets: 272 10*3/uL (ref 150–400)
RBC: 5.15 MIL/uL — ABNORMAL HIGH (ref 3.87–5.11)
RDW: 13.7 % (ref 11.5–15.5)
WBC: 10.2 10*3/uL (ref 4.0–10.5)
nRBC: 0 % (ref 0.0–0.2)

## 2022-10-03 LAB — LIPASE, BLOOD: Lipase: 24 U/L (ref 11–51)

## 2022-10-03 MED ORDER — LACTATED RINGERS IV SOLN: INTRAVENOUS | Status: DC

## 2022-10-03 MED ORDER — ROSUVASTATIN CALCIUM 10 MG PO TABS
10.0000 mg | ORAL_TABLET | Freq: Every day | ORAL | Status: DC
  Administered 2022-10-04 – 2022-10-06 (×3): 10 mg via ORAL
  Filled 2022-10-03 (×3): qty 1

## 2022-10-03 MED ORDER — ASPIRIN 81 MG PO TBEC
81.0000 mg | DELAYED_RELEASE_TABLET | Freq: Every day | ORAL | Status: DC
  Administered 2022-10-04 – 2022-10-06 (×3): 81 mg via ORAL
  Filled 2022-10-03 (×3): qty 1

## 2022-10-03 MED ORDER — ACETAMINOPHEN 650 MG RE SUPP: 650.0000 mg | Freq: Four times a day (QID) | RECTAL | Status: DC | PRN

## 2022-10-03 MED ORDER — LEVOTHYROXINE SODIUM 75 MCG PO TABS
75.0000 ug | ORAL_TABLET | Freq: Every day | ORAL | Status: DC
  Administered 2022-10-04 – 2022-10-06 (×2): 75 ug via ORAL
  Filled 2022-10-03 (×2): qty 1

## 2022-10-03 MED ORDER — METOPROLOL TARTRATE 5 MG/5ML IV SOLN: 5.0000 mg | Freq: Four times a day (QID) | INTRAVENOUS | Status: DC | PRN

## 2022-10-03 MED ORDER — IOHEXOL 300 MG/ML  SOLN
100.0000 mL | Freq: Once | INTRAMUSCULAR | Status: AC | PRN
  Administered 2022-10-03: 100 mL via INTRAVENOUS

## 2022-10-03 MED ORDER — FLECAINIDE ACETATE 50 MG PO TABS
50.0000 mg | ORAL_TABLET | Freq: Two times a day (BID) | ORAL | Status: DC
  Administered 2022-10-04 – 2022-10-06 (×5): 50 mg via ORAL
  Filled 2022-10-03 (×6): qty 1

## 2022-10-03 MED ORDER — SODIUM CHLORIDE 0.9 % IV BOLUS
1000.0000 mL | Freq: Once | INTRAVENOUS | Status: AC
  Administered 2022-10-03: 1000 mL via INTRAVENOUS

## 2022-10-03 MED ORDER — ONDANSETRON HCL 4 MG/2ML IJ SOLN
4.0000 mg | Freq: Once | INTRAMUSCULAR | Status: AC
  Administered 2022-10-03: 4 mg via INTRAVENOUS
  Filled 2022-10-03: qty 2

## 2022-10-03 MED ORDER — ALBUTEROL SULFATE (2.5 MG/3ML) 0.083% IN NEBU: 3.0000 mL | INHALATION_SOLUTION | Freq: Four times a day (QID) | RESPIRATORY_TRACT | Status: DC | PRN

## 2022-10-03 MED ORDER — PRIMIDONE 50 MG PO TABS
50.0000 mg | ORAL_TABLET | Freq: Every day | ORAL | Status: DC
  Administered 2022-10-04 – 2022-10-06 (×3): 50 mg via ORAL
  Filled 2022-10-03 (×3): qty 1

## 2022-10-03 MED ORDER — FENTANYL CITRATE PF 50 MCG/ML IJ SOSY
12.5000 ug | PREFILLED_SYRINGE | INTRAMUSCULAR | Status: DC | PRN
  Administered 2022-10-04 – 2022-10-05 (×5): 50 ug via INTRAVENOUS
  Filled 2022-10-03 (×5): qty 1

## 2022-10-03 MED ORDER — VERAPAMIL HCL ER 120 MG PO TBCR
60.0000 mg | EXTENDED_RELEASE_TABLET | Freq: Every day | ORAL | Status: DC
  Administered 2022-10-04 – 2022-10-05 (×2): 60 mg via ORAL
  Filled 2022-10-03 (×3): qty 0.5

## 2022-10-03 MED ORDER — TRAZODONE HCL 50 MG PO TABS
25.0000 mg | ORAL_TABLET | Freq: Every evening | ORAL | Status: DC | PRN
  Administered 2022-10-05: 25 mg via ORAL
  Filled 2022-10-03: qty 1

## 2022-10-03 MED ORDER — HYDROCODONE-ACETAMINOPHEN 5-325 MG PO TABS
1.0000 | ORAL_TABLET | ORAL | Status: DC | PRN
  Administered 2022-10-04: 1 via ORAL
  Administered 2022-10-04: 2 via ORAL
  Filled 2022-10-03: qty 2
  Filled 2022-10-03: qty 1

## 2022-10-03 MED ORDER — HYDROCHLOROTHIAZIDE 25 MG PO TABS
25.0000 mg | ORAL_TABLET | Freq: Every day | ORAL | Status: DC
  Administered 2022-10-04 – 2022-10-06 (×3): 25 mg via ORAL
  Filled 2022-10-03 (×3): qty 1

## 2022-10-03 MED ORDER — ENOXAPARIN SODIUM 40 MG/0.4ML IJ SOSY
40.0000 mg | PREFILLED_SYRINGE | INTRAMUSCULAR | Status: DC
  Administered 2022-10-04 – 2022-10-05 (×3): 40 mg via SUBCUTANEOUS
  Filled 2022-10-03 (×3): qty 0.4

## 2022-10-03 MED ORDER — MORPHINE SULFATE (PF) 4 MG/ML IV SOLN
4.0000 mg | Freq: Once | INTRAVENOUS | Status: AC
  Administered 2022-10-03: 4 mg via INTRAVENOUS
  Filled 2022-10-03: qty 1

## 2022-10-03 MED ORDER — POLYETHYLENE GLYCOL 3350 17 G PO PACK: 17.0000 g | PACK | Freq: Every day | ORAL | Status: DC | PRN

## 2022-10-03 MED ORDER — ACETAMINOPHEN 325 MG PO TABS: 650.0000 mg | ORAL_TABLET | Freq: Four times a day (QID) | ORAL | Status: DC | PRN

## 2022-10-03 NOTE — ED Provider Notes (Signed)
[place] EMERGENCY DEPARTMENT AT [facility] Provider Note   CSN: [id number] Arrival date & time: 10/03/22  1720     History  Chief Complaint  Patient presents with   Abdominal Pain   Nausea    Kirsten [name] is a 82 y.o. female history of SBO, previous pelvic floor surgery and lysis of adhesions, here presenting with abdominal pain and vomiting.  Patient states that for the last 3 days, she has persistent nausea vomiting.  She states that she did pass gas and had bowel movement yesterday.  Patient lives by herself.  She states that she does have a history of small bowel obstruction.  She states that she was at [facility] last October and required surgery at that time.  The history is provided by the patient.       Home Medications Prior to Admission medications   Medication Sig Start Date End Date Taking? Authorizing Provider  albuterol (VENTOLIN HFA) 108 (90 Base) MCG/ACT inhaler Inhale 2 puffs into the lungs every 6 (six) hours as needed for wheezing.    [provider]  aspirin 81 MG tablet Take 81 mg by mouth daily.    [provider]  beclomethasone (QVAR REDIHALER) 40 MCG/ACT inhaler Inhale 2 puffs into the lungs 2 (two) times daily. 12/29/17   [name], MD  beclomethasone (QVAR) 80 MCG/ACT inhaler Inhale 2 puffs into the lungs 2 (two) times daily. Patient not taking: Reported on 09/29/2021 12/26/17   [name], MD  Biotin 5000 MCG TABS Take 5,000 mcg by mouth daily. \    [provider]  CINNAMON PO Take 1,000 mg by mouth daily.    [provider]  estrogens, conjugated, (PREMARIN) 0.9 MG tablet Take 0.9 mg by mouth as directed. Take daily for 21 days then do not take for 7 days.    [provider]  flecainide (TAMBOCOR) 50 MG tablet TAKE 1 TABLET BY MOUTH TWICE A DAY MAY TAKE 1 ADDITIONAL TABLET AS NEEDED FOR FAST HEART BEAT 03/31/22   Marinus Maw, MD  Glucosamine-Chondroitin (OSTEO BI-FLEX REGULAR STRENGTH  PO) Take 1 tablet by mouth daily.    [provider]  hydrochlorothiazide (HYDRODIURIL) 25 MG tablet Take 1 tablet by mouth daily.    [provider]  methocarbamol (ROBAXIN) 500 MG tablet Take 0.5 tablets every 8 hours by oral route as needed for spasm. 07/02/21   [provider]  mirtazapine (REMERON) 15 MG tablet Take 15 mg by mouth at bedtime. 12/30/21   [provider]  Misc Natural Products (OSTEO BI-FLEX JOINT SHIELD PO) Take 1 capsule by mouth daily.     [provider]  ondansetron (ZOFRAN) 4 MG tablet Take 1 tablet (4 mg total) by mouth every 8 (eight) hours as needed. 08/28/20   Hyatt, Max T, DPM  primidone (MYSOLINE) 50 MG tablet Take 50 mg by mouth daily. 04/08/21   [provider]  rosuvastatin (CRESTOR) 10 MG tablet Take 1 tablet by mouth daily. 08/21/17   [provider]  Sodium Fluoride 1.1 % PSTE Place onto teeth as directed.     [provider]  SYNTHROID 75 MCG tablet Take 1 tablet by mouth daily. 08/21/17   [provider]  tiotropium (SPIRIVA HANDIHALER) 18 MCG inhalation capsule PLACE 1 CAPSULE INTO INHALER AND INHALE DAILY. Patient not taking: Reported on 09/29/2021 12/26/17   [name], MD  verapamil (CALAN-SR) 120 MG CR tablet Take 0.5 tablets (60 mg total) by  mouth at bedtime. 09/29/21   Marinus Maw, MD  Vitamin D, Ergocalciferol, (DRISDOL) 50000 UNITS CAPS Take 50,000 Units by mouth. Twice a month    [provider]  zolpidem (AMBIEN) 10 MG tablet Take 10 mg by mouth at bedtime as needed for sleep.     [provider]      Allergies    Patient has no known allergies.    Review of Systems   Review of Systems  Gastrointestinal:  Positive for abdominal pain and vomiting.  All other systems reviewed and are negative.   Physical Exam Updated Vital Signs BP (!) 155/66   Pulse 65   Temp 98.5 F (36.9 C) (Oral)   Resp 18   Ht 4\' 11"  (1.499 m)   Wt 47.6 kg   SpO2 94%    BMI 21.21 kg/m  Physical Exam Vitals and nursing note reviewed.  Constitutional:      Comments: Chronically ill and uncomfortable and dehydrated  HENT:     Head: Normocephalic.     Mouth/Throat:     Pharynx: Oropharynx is clear.  Eyes:     Extraocular Movements: Extraocular movements intact.  Cardiovascular:     Rate and Rhythm: Normal rate and regular rhythm.  Abdominal:     Comments: Distended and mild diffuse tenderness.  No rebound  Skin:    Capillary Refill: Capillary refill takes less than 2 seconds.  Neurological:     General: No focal deficit present.     Mental Status: She is oriented to person, place, and time.  Psychiatric:        Mood and Affect: Mood normal.        Behavior: Behavior normal.     ED Results / Procedures / Treatments   Labs (all labs ordered are listed, but only abnormal results are displayed) Labs Reviewed  COMPREHENSIVE METABOLIC PANEL - Abnormal; Notable for the following components:      Result Value   Sodium 134 (*)    BUN 30 (*)    All other components within normal limits  CBC - Abnormal; Notable for the following components:   RBC 5.15 (*)    All other components within normal limits  URINALYSIS, ROUTINE W REFLEX MICROSCOPIC - Abnormal; Notable for the following components:   APPearance CLOUDY (*)    Ketones, ur 20 (*)    All other components within normal limits  LIPASE, BLOOD    EKG None  Radiology CT ABDOMEN PELVIS W CONTRAST  Result Date: 10/03/2022 CLINICAL DATA:  Concern for bowel obstruction. EXAM: CT ABDOMEN AND PELVIS WITH CONTRAST TECHNIQUE: Multidetector CT imaging of the abdomen and pelvis was performed using the standard protocol following bolus administration of intravenous contrast. RADIATION DOSE REDUCTION: This exam was performed according to the departmental dose-optimization program which includes automated exposure control, adjustment of the mA and/or kV according to patient size and/or use of iterative  reconstruction technique. CONTRAST:  OMNIPAQUE IOHEXOL 300 MG/ML  SOLN COMPARISON:  CT abdomen pelvis dated 02/01/2006. FINDINGS: Lower chest: The visualized lung bases are clear. There is coronary vascular calcification. No intra-abdominal free air or free fluid. Hepatobiliary: The liver is unremarkable. No related patient. The gallbladder is unremarkable. Pancreas: Unremarkable. No pancreatic ductal dilatation or surrounding inflammatory changes. Spleen: Normal in size without focal abnormality. Adrenals/Urinary Tract: The adrenal glands are unremarkable. Small bilateral renal cysts suboptimally evaluated on this CT. This can be better evaluated with ultrasound on a nonemergent/outpatient basis. There is no hydronephrosis on  either side. There is symmetric enhancement and excretion of contrast by both kidneys. The visualized ureters and urinary bladder appear unremarkable. Stomach/Bowel: Multiple mildly dilated loops of small bowel measure up to 3.7 cm. A transition is noted in the right lower quadrant (56/2 and coronal 52/7) likely representing adhesions. The distal small bowel are collapsed. The appendix is not visualized with certainty. No inflammatory changes identified in the right lower quadrant. Vascular/Lymphatic: Advanced aortoiliac atherosclerotic disease. The IVC is unremarkable. No portal venous gas. There is no adenopathy. Reproductive: Hysterectomy.  No adnexal masses. Other: None Musculoskeletal: Osteopenia with degenerative changes. No acute osseous pathology. IMPRESSION: 1. Small-bowel obstruction with a transition in the right lower quadrant likely secondary to adhesions. 2.  Aortic Atherosclerosis (ICD10-I70.0). Electronically Signed   By: Elgie Collard M.D.   On: 10/03/2022 20:39    Procedures Procedures    .  Medications Ordered in ED Medications  sodium chloride 0.9 % bolus 1,000 mL (1,000 mLs Intravenous New Bag/Given 10/03/22 1926)  ondansetron (ZOFRAN) injection 4 mg (4  mg Intravenous Given 10/03/22 1919)  morphine (PF) 4 MG/ML injection 4 mg (4 mg Intravenous Given 10/03/22 1921)  iohexol (OMNIPAQUE) 300 MG/ML solution 100 mL (100 mLs Intravenous Contrast Given 10/03/22 2010)    ED Course/ Medical Decision Making/ A&P                                 Medical Decision Making Kirsten [name] is a 82 y.o. female here presenting with abdominal pain and distention and nausea vomiting.  Patient has a history of SBO so high suspicion for SBO.  Also consider ileus versus gastroenteritis.  Plan to get CBC and CMP and UA and CT abdomen pelvis.  Will hydrate and give nausea medicine and reassess.  9:28 PM Reviewed patient's labs and they were unremarkable.  CT abdomen pelvis showed small bowel obstruction with transition point in the right lower quadrant.  I discussed with patient about transfer to [facility]  She states that she wants to stay here and does not want to go to [facility].  I ordered NG tube and secure chat the general surgery group to notify general surgery to see patient in the morning.  Patient will be admitted by hospitalist for SBO  Problems Addressed: SBO (small bowel obstruction) (HCC): acute illness or injury  Amount and/or Complexity of Data Reviewed Labs: ordered. Decision-making details documented in ED Course. Radiology: ordered and independent interpretation performed. Decision-making details documented in ED Course.  Risk Prescription drug management. Decision regarding hospitalization.    Final Clinical Impression(s) / ED Diagnoses Final diagnoses:  None    Rx / DC Orders ED Discharge Orders     None         Charlynne Pander, MD 10/03/22 2129

## 2022-10-03 NOTE — Assessment & Plan Note (Signed)
As needed albuterol as used at home

## 2022-10-03 NOTE — Assessment & Plan Note (Signed)
Continue flecainide 

## 2022-10-03 NOTE — ED Triage Notes (Signed)
Pt arrived via POV. C/o gen abd pain that migrates, and nausea for 4x days.  Hx bowel obstruction, abd surgery

## 2022-10-03 NOTE — Assessment & Plan Note (Signed)
Has dental device but declines treatment here

## 2022-10-03 NOTE — ED Notes (Signed)
ED TO INPATIENT HANDOFF REPORT  ED Nurse Name and Phone #: Antoine Primas RN  S Name/Age/Gender Kirsten [name] 82 y.o. female Room/Bed: WA18/WA18  Code Status   Code Status: DNR  Home/SNF/Other Home Patient oriented to: self, place, time, and situation Is this baseline? Yes   Triage Complete: Triage complete  Chief Complaint SBO (small bowel obstruction) (HCC) [K56.609]  Triage Note Pt arrived via POV. C/o gen abd pain that migrates, and nausea for 4x days.  Hx bowel obstruction, abd surgery   Allergies No Known Allergies  Level of Care/Admitting Diagnosis ED Disposition     ED Disposition  Admit   Condition  --   Comment  Hospital Area: [facility] [[id number]]  Level of Care: Med-Surg [16]  May admit patient to [facility] or [facility] if equivalent level of care is available:: No  Covid Evaluation: Asymptomatic - no recent exposure (last 10 days) testing not required  Diagnosis: SBO (small bowel obstruction) [facility]) [[id number]]  Admitting Physician: Samara Snide  Attending Physician: Reva Bores [[id number]]  Certification:: I certify this patient will need inpatient services for at least 2 midnights  Estimated Length of Stay: 3          B Medical/Surgery History Past Medical History:  Diagnosis Date   Allergy    occasional seasonal   Asthma    COPD (chronic obstructive pulmonary disease) (HCC)    Emphysema of lung (HCC)    H/O: hysterectomy    Heart murmur    HTN (hypertension)    HTN (hypertension)    IGT (impaired glucose tolerance)    Irregular heart rate    take Flecinade   OSA (obstructive sleep apnea) 12/08/2017   Osteopenia    Palpitations    Thyroid disease    hypothyroid   Tinnitus    Tremor, essential    Vertigo    Past Surgical History:  Procedure Laterality Date   CATARACT EXTRACTION     both eyes- February 2015   orthopedic procedure     both feet   TRIGGER FINGER RELEASE     right hand      A IV Location/Drains/Wounds Patient Lines/Drains/Airways Status     Active Line/Drains/Airways     Name Placement date Placement time Site Days   Peripheral IV 10/03/22 20 G 1" Anterior;Distal;Left Forearm 10/03/22  2300  Forearm  less than 1            Intake/Output Last 24 hours No intake or output data in the 24 hours ending 10/03/22 2331  Labs/Imaging Results for orders placed or performed during the hospital encounter of 10/03/22 (from the past 48 hour(s))  Lipase, blood     Status: None   Collection Time: 10/03/22  5:54 PM  Result Value Ref Range   Lipase 24 11 - 51 U/L    Comment: Performed at [facility], [address], [place], [place] [address]  Comprehensive metabolic panel     Status: Abnormal   Collection Time: 10/03/22  5:54 PM  Result Value Ref Range   Sodium 134 (L) 135 - 145 mmol/L   Potassium 3.7 3.5 - 5.1 mmol/L   Chloride 101 98 - 111 mmol/L   CO2 22 22 - 32 mmol/L   Glucose, Bld 83 70 - 99 mg/dL    Comment: Glucose reference range applies only to samples taken after fasting for at least 8 hours.   BUN 30 (H) 8 - 23 mg/dL  Creatinine, Ser 0.77 0.44 - 1.00 mg/dL   Calcium 9.0 8.9 - 14.7 mg/dL   Total Protein 6.9 6.5 - 8.1 g/dL   Albumin 3.5 3.5 - 5.0 g/dL   AST 17 15 - 41 U/L   ALT 13 0 - 44 U/L   Alkaline Phosphatase 81 38 - 126 U/L   Total Bilirubin 1.0 0.3 - 1.2 mg/dL   GFR, Estimated >82 >95 mL/min    Comment: (NOTE) Calculated using the CKD-EPI Creatinine Equation (2021)    Anion gap 11 5 - 15    Comment: Performed at [facility], [address] [place], [place] [address]  CBC     Status: Abnormal   Collection Time: 10/03/22  5:54 PM  Result Value Ref Range   WBC 10.2 4.0 - 10.5 K/uL   RBC 5.15 (H) 3.87 - 5.11 MIL/uL   Hemoglobin 14.1 12.0 - 15.0 g/dL   HCT 86.5 78.4 - 69.6 %   MCV 86.4 80.0 - 100.0 fL   MCH 27.4 26.0 - 34.0 pg   MCHC 31.7 30.0 - 36.0 g/dL   RDW 29.5 28.4 - 13.2 %    Platelets 272 150 - 400 K/uL   nRBC 0.0 0.0 - 0.2 %    Comment: Performed at [facility], [address], [place], [place] [address]  Urinalysis, Routine w reflex microscopic -Urine, Clean Catch     Status: Abnormal   Collection Time: 10/03/22  7:59 PM  Result Value Ref Range   Color, Urine YELLOW YELLOW   APPearance CLOUDY (A) CLEAR   Specific Gravity, Urine 1.026 1.005 - 1.030   pH 5.0 5.0 - 8.0   Glucose, UA NEGATIVE NEGATIVE mg/dL   Hgb urine dipstick NEGATIVE NEGATIVE   Bilirubin Urine NEGATIVE NEGATIVE   Ketones, ur 20 (A) NEGATIVE mg/dL   Protein, ur NEGATIVE NEGATIVE mg/dL   Nitrite NEGATIVE NEGATIVE   Leukocytes,Ua NEGATIVE NEGATIVE    Comment: Performed at [facility], [address], [place], [place] [address]   CT ABDOMEN PELVIS W CONTRAST  Result Date: 10/03/2022 CLINICAL DATA:  Concern for bowel obstruction. EXAM: CT ABDOMEN AND PELVIS WITH CONTRAST TECHNIQUE: Multidetector CT imaging of the abdomen and pelvis was performed using the standard protocol following bolus administration of intravenous contrast. RADIATION DOSE REDUCTION: This exam was performed according to the departmental dose-optimization program which includes automated exposure control, adjustment of the mA and/or kV according to patient size and/or use of iterative reconstruction technique. CONTRAST:  OMNIPAQUE IOHEXOL 300 MG/ML  SOLN COMPARISON:  CT abdomen pelvis dated 02/01/2006. FINDINGS: Lower chest: The visualized lung bases are clear. There is coronary vascular calcification. No intra-abdominal free air or free fluid. Hepatobiliary: The liver is unremarkable. No related patient. The gallbladder is unremarkable. Pancreas: Unremarkable. No pancreatic ductal dilatation or surrounding inflammatory changes. Spleen: Normal in size without focal abnormality. Adrenals/Urinary Tract: The adrenal glands are unremarkable. Small bilateral renal cysts suboptimally evaluated  on this CT. This can be better evaluated with ultrasound on a nonemergent/outpatient basis. There is no hydronephrosis on either side. There is symmetric enhancement and excretion of contrast by both kidneys. The visualized ureters and urinary bladder appear unremarkable. Stomach/Bowel: Multiple mildly dilated loops of small bowel measure up to 3.7 cm. A transition is noted in the right lower quadrant (56/2 and coronal 52/7) likely representing adhesions. The distal small bowel are collapsed. The appendix is not visualized with certainty. No inflammatory changes identified in the right lower quadrant.  Vascular/Lymphatic: Advanced aortoiliac atherosclerotic disease. The IVC is unremarkable. No portal venous gas. There is no adenopathy. Reproductive: Hysterectomy.  No adnexal masses. Other: None Musculoskeletal: Osteopenia with degenerative changes. No acute osseous pathology. IMPRESSION: 1. Small-bowel obstruction with a transition in the right lower quadrant likely secondary to adhesions. 2.  Aortic Atherosclerosis (ICD10-I70.0). Electronically Signed   By: Elgie Collard M.D.   On: 10/03/2022 20:39    Pending Labs Unresulted Labs (From admission, onward)     Start     Ordered   10/10/22 0500  Creatinine, serum  (enoxaparin (LOVENOX)    CrCl >/= 30 ml/min)  Weekly,   R     Comments: while on enoxaparin therapy    10/03/22 2220   10/04/22 0500  Basic metabolic panel  Tomorrow morning,   R        10/03/22 2220   10/04/22 0500  CBC  Tomorrow morning,   R        10/03/22 2220   10/03/22 2220  TSH  Add-on,   AD        10/03/22 2220   10/03/22 2219  CBC  (enoxaparin (LOVENOX)    CrCl >/= 30 ml/min)  Once,   R       Comments: Baseline for enoxaparin therapy IF NOT ALREADY DRAWN.  Notify MD if PLT < 100 K.    10/03/22 2220            Vitals/Pain Today's Vitals   10/03/22 1930 10/03/22 1945 10/03/22 2222 10/03/22 2328  BP: (!) 154/71 (!) 155/66 (!) 151/69   Pulse: 65 65 72   Resp: 16 18 20     Temp:  98.5 F (36.9 C)  98.3 F (36.8 C)  TempSrc:  Oral    SpO2: 94% 94% 94%   Weight:      Height:      PainSc:        Isolation Precautions No active isolations  Medications Medications  aspirin EC tablet 81 mg (has no administration in time range)  flecainide (TAMBOCOR) tablet 50 mg (has no administration in time range)  hydrochlorothiazide (HYDRODIURIL) tablet 25 mg (has no administration in time range)  rosuvastatin (CRESTOR) tablet 10 mg (has no administration in time range)  verapamil (CALAN-SR) CR tablet 60 mg (has no administration in time range)  levothyroxine (SYNTHROID) tablet 75 mcg (has no administration in time range)  primidone (MYSOLINE) tablet 50 mg (has no administration in time range)  albuterol (PROVENTIL) (2.5 MG/3ML) 0.083% nebulizer solution 3 mL (has no administration in time range)  enoxaparin (LOVENOX) injection 40 mg (has no administration in time range)  lactated ringers infusion (has no administration in time range)  acetaminophen (TYLENOL) tablet 650 mg (has no administration in time range)    Or  acetaminophen (TYLENOL) suppository 650 mg (has no administration in time range)  HYDROcodone-acetaminophen (NORCO/VICODIN) 5-325 MG per tablet 1-2 tablet (has no administration in time range)  fentaNYL (SUBLIMAZE) injection 12.5-50 mcg (has no administration in time range)  traZODone (DESYREL) tablet 25 mg (has no administration in time range)  polyethylene glycol (MIRALAX / GLYCOLAX) packet 17 g (has no administration in time range)  metoprolol tartrate (LOPRESSOR) injection 5 mg (has no administration in time range)  sodium chloride 0.9 % bolus 1,000 mL (1,000 mLs Intravenous New Bag/Given 10/03/22 1926)  ondansetron (ZOFRAN) injection 4 mg (4 mg Intravenous Given 10/03/22 1919)  morphine (PF) 4 MG/ML injection 4 mg (4 mg Intravenous Given 10/03/22 1921)  iohexol (OMNIPAQUE) 300  MG/ML solution 100 mL (100 mLs Intravenous Contrast Given 10/03/22 2010)     Mobility walks     Focused Assessments Pt came to ER for abd pain and nausea. Had vomiting on Wednesday. Hx of bowel obstructions. Dx with SBO.    R Recommendations: See Admitting Provider Note  Report given to:   Additional Notes: n/a

## 2022-10-03 NOTE — H&P (Signed)
History and Physical    Patient: Kirsten [name] VOZ:[id number] DOB: 1940/12/07 DOA: 10/03/2022 DOS: the patient was seen and examined on 10/03/2022 PCP: [name]., MD  Patient coming from: Home  Chief Complaint:  Chief Complaint  Patient presents with   Abdominal Pain   Nausea   HPI: Kirsten [name] is a 82 y.o. female with medical history significant of  COPD, HTN, hypothyroidism who underwent a robotic sacrocolpopexy in October of last year at [facility].  4 days postoperatively she was readmitted with SBO and found to have enterotomy at subsequent surgery.  She had a prolonged postoperative course with NG tube placement and eventual recovery of her bowel function.  She was in her usual state of health until proximately 3 to 4 days ago when she developed continuous nausea and vomiting.  Since that time she stopped vomiting however she continues to have lower abdominal pain and not so brought her in today.  Today she has had crackers and part of a [employer] cheeseburger which all stay down.  She also reports that she is passing flatus and had a small BM today.  In the ED she was noted to have no significant electrolyte derangements and no significant elevated white count.  CT was noted to show small bowel obstruction with right lower quadrant transition point. Review of Systems: As mentioned in the history of present illness. All other systems reviewed and are negative. Past Medical History:  Diagnosis Date   Allergy    occasional seasonal   Asthma    COPD (chronic obstructive pulmonary disease) (HCC)    Emphysema of lung (HCC)    H/O: hysterectomy    Heart murmur    HTN (hypertension)    HTN (hypertension)    IGT (impaired glucose tolerance)    Irregular heart rate    take Flecinade   OSA (obstructive sleep apnea) 12/08/2017   Osteopenia    Palpitations    Thyroid disease    hypothyroid   Tinnitus    Tremor, essential    Vertigo    Past Surgical History:  Procedure  Laterality Date   CATARACT EXTRACTION     both eyes- February 2015   orthopedic procedure     both feet   TRIGGER FINGER RELEASE     right hand   Social History:  reports that she has never smoked. She has never used smokeless tobacco. She reports that she does not drink alcohol and does not use drugs.  No Known Allergies  Family History  Problem Relation Age of Onset   Kidney disease Mother    Cirrhosis Mother    Lung cancer Father    Heart disease Maternal Grandmother    Colon cancer Neg Hx    Esophageal cancer Neg Hx    Rectal cancer Neg Hx    Stomach cancer Neg Hx    Pancreatic cancer Neg Hx     Prior to Admission medications   Medication Sig Start Date End Date Taking? Authorizing Provider  albuterol (VENTOLIN HFA) 108 (90 Base) MCG/ACT inhaler Inhale 2 puffs into the lungs every 6 (six) hours as needed for wheezing.   Yes [provider]  aspirin 81 MG tablet Take 81 mg by mouth daily.   Yes [provider]  Biotin 5000 MCG TABS Take 5,000 mcg by mouth daily. \   Yes [provider]  CINNAMON PO Take 1,000 mg by mouth daily.   Yes [provider]  flecainide (TAMBOCOR) 50  MG tablet TAKE 1 TABLET BY MOUTH TWICE A DAY MAY TAKE 1 ADDITIONAL TABLET AS NEEDED FOR FAST HEART BEAT 03/31/22  Yes Marinus Maw, MD  Glucosamine-Chondroitin (OSTEO BI-FLEX REGULAR STRENGTH PO) Take 1 tablet by mouth daily.   Yes [provider]  hydrochlorothiazide (HYDRODIURIL) 25 MG tablet Take 1 tablet by mouth daily.   Yes [provider]  mirtazapine (REMERON) 15 MG tablet Take 7.5 mg by mouth at bedtime. 12/30/21  Yes [provider]  primidone (MYSOLINE) 50 MG tablet Take 50 mg by mouth at bedtime. 04/08/21  Yes [provider]  rosuvastatin (CRESTOR) 10 MG tablet Take 1 tablet by mouth daily. 08/21/17  Yes [provider]  SYNTHROID 75 MCG tablet Take 1 tablet by mouth daily. 08/21/17  Yes [provider]   verapamil (CALAN-SR) 120 MG CR tablet Take 0.5 tablets (60 mg total) by mouth at bedtime. Patient taking differently: Take 120 mg by mouth at bedtime. 09/29/21  Yes Marinus Maw, MD  Vitamin D, Ergocalciferol, (DRISDOL) 50000 UNITS CAPS Take 50,000 Units by mouth. Every other week on sunday   Yes [provider]  zolpidem (AMBIEN) 10 MG tablet Take 10 mg by mouth at bedtime as needed for sleep.    Yes [provider]  estrogens, conjugated, (PREMARIN) 0.9 MG tablet Take 0.9 mg by mouth as directed. Take daily for 21 days then do not take for 7 days.    [provider]  tiotropium (SPIRIVA HANDIHALER) 18 MCG inhalation capsule PLACE 1 CAPSULE INTO INHALER AND INHALE DAILY. Patient not taking: Reported on 09/29/2021 12/26/17   [name], MD  triamcinolone cream (KENALOG) 0.1 % Apply 1 Application topically 2 (two) times daily. Patient not taking: Reported on 10/03/2022 08/24/22   [provider]    Physical Exam: Vitals:   10/03/22 1751 10/03/22 1930 10/03/22 1945 10/03/22 2222  BP:  (!) 154/71 (!) 155/66 (!) 151/69  Pulse:  65 65 72  Resp:  16 18 20   Temp:   98.5 F (36.9 C)   TempSrc:   Oral   SpO2:  94% 94% 94%  Weight: 47.6 kg     Height: 4\' 11"  (1.499 m)      Physical Examination: General appearance - alert, well appearing, and in no distress Mental status - alert, oriented to person, place, and time Chest - clear to auscultation, no wheezes, rales or rhonchi, symmetric air entry Heart - normal rate, regular rhythm, normal S1, S2, no murmurs, rubs, clicks or gallops Abdomen - soft, nontender, nondistended, no masses or organomegaly no rebound tenderness noted bowel sounds normal in all 4 quadrants Extremities - peripheral pulses normal, no pedal edema, no clubbing or cyanosis  Data Reviewed: Results for orders placed or performed during the hospital encounter of 10/03/22 (from the past 24 hour(s))  Lipase, blood     Status: None   Collection  Time: 10/03/22  5:54 PM  Result Value Ref Range   Lipase 24 11 - 51 U/L  Comprehensive metabolic panel     Status: Abnormal   Collection Time: 10/03/22  5:54 PM  Result Value Ref Range   Sodium 134 (L) 135 - 145 mmol/L   Potassium 3.7 3.5 - 5.1 mmol/L   Chloride 101 98 - 111 mmol/L   CO2 22 22 - 32 mmol/L   Glucose, Bld 83 70 - 99 mg/dL   BUN 30 (H) 8 - 23 mg/dL   Creatinine, Ser 8.41 0.44 - 1.00 mg/dL  Calcium 9.0 8.9 - 10.3 mg/dL   Total Protein 6.9 6.5 - 8.1 g/dL   Albumin 3.5 3.5 - 5.0 g/dL   AST 17 15 - 41 U/L   ALT 13 0 - 44 U/L   Alkaline Phosphatase 81 38 - 126 U/L   Total Bilirubin 1.0 0.3 - 1.2 mg/dL   GFR, Estimated >16 >10 mL/min   Anion gap 11 5 - 15  CBC     Status: Abnormal   Collection Time: 10/03/22  5:54 PM  Result Value Ref Range   WBC 10.2 4.0 - 10.5 K/uL   RBC 5.15 (H) 3.87 - 5.11 MIL/uL   Hemoglobin 14.1 12.0 - 15.0 g/dL   HCT 96.0 45.4 - 09.8 %   MCV 86.4 80.0 - 100.0 fL   MCH 27.4 26.0 - 34.0 pg   MCHC 31.7 30.0 - 36.0 g/dL   RDW 11.9 14.7 - 82.9 %   Platelets 272 150 - 400 K/uL   nRBC 0.0 0.0 - 0.2 %  Urinalysis, Routine w reflex microscopic -Urine, Clean Catch     Status: Abnormal   Collection Time: 10/03/22  7:59 PM  Result Value Ref Range   Color, Urine YELLOW YELLOW   APPearance CLOUDY (A) CLEAR   Specific Gravity, Urine 1.026 1.005 - 1.030   pH 5.0 5.0 - 8.0   Glucose, UA NEGATIVE NEGATIVE mg/dL   Hgb urine dipstick NEGATIVE NEGATIVE   Bilirubin Urine NEGATIVE NEGATIVE   Ketones, ur 20 (A) NEGATIVE mg/dL   Protein, ur NEGATIVE NEGATIVE mg/dL   Nitrite NEGATIVE NEGATIVE   Leukocytes,Ua NEGATIVE NEGATIVE   CT ABDOMEN PELVIS W CONTRAST  Result Date: 10/03/2022 CLINICAL DATA:  Concern for bowel obstruction. EXAM: CT ABDOMEN AND PELVIS WITH CONTRAST TECHNIQUE: Multidetector CT imaging of the abdomen and pelvis was performed using the standard protocol following bolus administration of intravenous contrast. RADIATION DOSE REDUCTION: This  exam was performed according to the departmental dose-optimization program which includes automated exposure control, adjustment of the mA and/or kV according to patient size and/or use of iterative reconstruction technique. CONTRAST:  OMNIPAQUE IOHEXOL 300 MG/ML  SOLN COMPARISON:  CT abdomen pelvis dated 02/01/2006. FINDINGS: Lower chest: The visualized lung bases are clear. There is coronary vascular calcification. No intra-abdominal free air or free fluid. Hepatobiliary: The liver is unremarkable. No related patient. The gallbladder is unremarkable. Pancreas: Unremarkable. No pancreatic ductal dilatation or surrounding inflammatory changes. Spleen: Normal in size without focal abnormality. Adrenals/Urinary Tract: The adrenal glands are unremarkable. Small bilateral renal cysts suboptimally evaluated on this CT. This can be better evaluated with ultrasound on a nonemergent/outpatient basis. There is no hydronephrosis on either side. There is symmetric enhancement and excretion of contrast by both kidneys. The visualized ureters and urinary bladder appear unremarkable. Stomach/Bowel: Multiple mildly dilated loops of small bowel measure up to 3.7 cm. A transition is noted in the right lower quadrant (56/2 and coronal 52/7) likely representing adhesions. The distal small bowel are collapsed. The appendix is not visualized with certainty. No inflammatory changes identified in the right lower quadrant. Vascular/Lymphatic: Advanced aortoiliac atherosclerotic disease. The IVC is unremarkable. No portal venous gas. There is no adenopathy. Reproductive: Hysterectomy.  No adnexal masses. Other: None Musculoskeletal: Osteopenia with degenerative changes. No acute osseous pathology. IMPRESSION: 1. Small-bowel obstruction with a transition in the right lower quadrant likely secondary to adhesions. 2.  Aortic Atherosclerosis (ICD10-I70.0). Electronically Signed   By: Elgie Collard M.D.   On: 10/03/2022 20:39  Assessment and Plan: * SBO (small bowel obstruction) (HCC) Likely partial given no vomiting for the last 3 to 4 days. Will hold on NG tube at this time because she is not vomiting and tolerated p.o. today Pain meds as needed for abdominal pain related to this IV fluid hydration Increase walking Clear liquid diet for now advance as tolerated  OSA (obstructive sleep apnea) Has dental device but declines treatment here  COPD with asthma As needed albuterol as used at home  Hypertension Continue HCTZ and Calan SR Lopressor as needed  PVC's (premature ventricular contractions) Continue flecainide      Advance Care Planning:   Code Status: DNR confirmed with patient  Consults: None  Family Communication: Patient at bedside.  Her neighbor was with her today.  She has a brother who lives in [place] whom she did not wish to be called.  She is in contact with him via text.  Severity of Illness: The appropriate patient status for this patient is INPATIENT. Inpatient status is judged to be reasonable and necessary in order to provide the required intensity of service to ensure the patient's safety. The patient's presenting symptoms, physical exam findings, and initial radiographic and laboratory data in the context of their chronic comorbidities is felt to place them at high risk for further clinical deterioration. Furthermore, it is not anticipated that the patient will be medically stable for discharge from the hospital within 2 midnights of admission.   * I certify that at the point of admission it is my clinical judgment that the patient will require inpatient hospital care spanning beyond 2 midnights from the point of admission due to high intensity of service, high risk for further deterioration and high frequency of surveillance required.*  Author: Reva Bores, MD 10/03/2022 11:04 PM  For on call review [email or web address].

## 2022-10-03 NOTE — Assessment & Plan Note (Signed)
Continue HCTZ and Calan SR Lopressor as needed

## 2022-10-03 NOTE — Hospital Course (Signed)
Patient is an 82 year old with history of COPD, HTN, hypothyroidism who underwent a robotic sacrocolpopexy in October of last year at [facility].  4 days postoperatively she was readmitted with SBO and found to have enterotomy at subsequent surgery.  She had a prolonged postoperative course with NG tube placement and eventual recovery of her bowel function.  She was in her usual state of health until proximately 3 to 4 days ago when she developed continuous nausea and vomiting.  Since that time she stopped vomiting however she continues to have lower abdominal pain and not so brought her in today.  Today she has had crackers and part of a [employer] cheeseburger which all stay down.  She also reports that she is passing flatus and had a small BM today.  In the ED she was noted to have no significant electrolyte derangements and no significant elevated white count.  CT was noted to show small bowel obstruction with right lower quadrant transition point.

## 2022-10-03 NOTE — Assessment & Plan Note (Addendum)
Likely partial given no vomiting for the last 3 to 4 days. Will hold on NG tube at this time because she is not vomiting and tolerated p.o. today Pain meds as needed for abdominal pain related to this IV fluid hydration Increase walking Clear liquid diet for now advance as tolerated

## 2022-10-04 ENCOUNTER — Encounter (HOSPITAL_COMMUNITY): Payer: Self-pay | Admitting: Family Medicine

## 2022-10-04 ENCOUNTER — Inpatient Hospital Stay (HOSPITAL_COMMUNITY): Payer: Medicare Other

## 2022-10-04 DIAGNOSIS — K56609 Unspecified intestinal obstruction, unspecified as to partial versus complete obstruction: Secondary | ICD-10-CM | POA: Diagnosis not present

## 2022-10-04 LAB — TSH: TSH: 3.431 u[IU]/mL (ref 0.350–4.500)

## 2022-10-04 MED ORDER — ONDANSETRON HCL 4 MG/2ML IJ SOLN
4.0000 mg | Freq: Four times a day (QID) | INTRAMUSCULAR | Status: DC | PRN
  Administered 2022-10-04 – 2022-10-05 (×2): 4 mg via INTRAVENOUS
  Filled 2022-10-04 (×2): qty 2

## 2022-10-04 MED ORDER — PROCHLORPERAZINE EDISYLATE 10 MG/2ML IJ SOLN
5.0000 mg | Freq: Once | INTRAMUSCULAR | Status: AC | PRN
  Administered 2022-10-04: 5 mg via INTRAVENOUS
  Filled 2022-10-04: qty 2

## 2022-10-04 MED ORDER — DIATRIZOATE MEGLUMINE & SODIUM 66-10 % PO SOLN
90.0000 mL | Freq: Once | ORAL | Status: AC
  Administered 2022-10-04: 90 mL via ORAL
  Filled 2022-10-04: qty 90

## 2022-10-04 NOTE — Consult Note (Signed)
[facility] Surgery Consult Note  Kirsten [name] 08-02-1940  [id number].    Requesting MD: Pamella Pert Chief Complaint/Reason for Consult: SBO  HPI:  Kirsten [name] is a 82 y.o. female PMH COPD, HTN, HLD, hypothyroidism who was admitted to [facility] last night with SBO. Patient underwent robotic sacrocolpopexy 12/03/21 at [facility]. She was readmitted postoperatively with SBO and required diagnostic laparoscopy, adhesiolysis, and repair small bowel enterotomy 12/10/22. She has done well since that time, then 5 days ago she developed abdominal pain, nausea, and vomiting. Pain is central in her abdomen. She has still been nauseated but no further emesis x4 days. She is still passing some flatus today, and her last BM was 2 days ago and tiny. She tolerated some liquids and half a [employer] burger yesterday. Continues to have intermittent, crampy mid-abdominal pain.  In the ED she underwent CT scan which shows SBO with transition point in the RLQ likely secondary to adhesions. Patient was admitted to the medical service and started on clear liquids. General surgery asked to see today.  Abdominal surgical history: in addition to the procedures above, hysterectomy Anticoagulants: none Nonsmoker Lives at home alone   Family History  Problem Relation Age of Onset   Kidney disease Mother    Cirrhosis Mother    Lung cancer Father    Heart disease Maternal Grandmother    Colon cancer Neg Hx    Esophageal cancer Neg Hx    Rectal cancer Neg Hx    Stomach cancer Neg Hx    Pancreatic cancer Neg Hx     Past Medical History:  Diagnosis Date   Allergy    occasional seasonal   Asthma    COPD (chronic obstructive pulmonary disease) (HCC)    Emphysema of lung (HCC)    H/O: hysterectomy    Heart murmur    HTN (hypertension)    HTN (hypertension)    IGT (impaired glucose tolerance)    Irregular heart rate    take Flecinade   OSA (obstructive sleep apnea) 12/08/2017   Osteopenia    Palpitations     Thyroid disease    hypothyroid   Tinnitus    Tremor, essential    Vertigo     Past Surgical History:  Procedure Laterality Date   CATARACT EXTRACTION     both eyes- February 2015   orthopedic procedure     both feet   TRIGGER FINGER RELEASE     right hand    Social History:  reports that she has never smoked. She has never used smokeless tobacco. She reports that she does not drink alcohol and does not use drugs.  Allergies: No Known Allergies  Medications Prior to Admission  Medication Sig Dispense Refill   albuterol (VENTOLIN HFA) 108 (90 Base) MCG/ACT inhaler Inhale 2 puffs into the lungs every 6 (six) hours as needed for wheezing.     aspirin 81 MG tablet Take 81 mg by mouth daily.     Biotin 5000 MCG TABS Take 5,000 mcg by mouth daily. \     CINNAMON PO Take 1,000 mg by mouth daily.     flecainide (TAMBOCOR) 50 MG tablet TAKE 1 TABLET BY MOUTH TWICE A DAY MAY TAKE 1 ADDITIONAL TABLET AS NEEDED FOR FAST HEART BEAT 225 tablet 2   Glucosamine-Chondroitin (OSTEO BI-FLEX REGULAR STRENGTH PO) Take 1 tablet by mouth daily.     hydrochlorothiazide (HYDRODIURIL) 25 MG tablet Take 1 tablet by mouth daily.     mirtazapine (REMERON) 15  MG tablet Take 7.5 mg by mouth at bedtime.     primidone (MYSOLINE) 50 MG tablet Take 50 mg by mouth at bedtime.     rosuvastatin (CRESTOR) 10 MG tablet Take 1 tablet by mouth daily.     SYNTHROID 75 MCG tablet Take 1 tablet by mouth daily.     verapamil (CALAN-SR) 120 MG CR tablet Take 0.5 tablets (60 mg total) by mouth at bedtime. (Patient taking differently: Take 120 mg by mouth at bedtime.) 30 tablet 2   Vitamin D, Ergocalciferol, (DRISDOL) 50000 UNITS CAPS Take 50,000 Units by mouth. Every other week on sunday     zolpidem (AMBIEN) 10 MG tablet Take 10 mg by mouth at bedtime as needed for sleep.      estrogens, conjugated, (PREMARIN) 0.9 MG tablet Take 0.9 mg by mouth as directed. Take daily for 21 days then do not take for 7 days.      tiotropium (SPIRIVA HANDIHALER) 18 MCG inhalation capsule PLACE 1 CAPSULE INTO INHALER AND INHALE DAILY. (Patient not taking: Reported on 09/29/2021) 90 capsule 4   triamcinolone cream (KENALOG) 0.1 % Apply 1 Application topically 2 (two) times daily. (Patient not taking: Reported on 10/03/2022)      Prior to Admission medications   Medication Sig Start Date End Date Taking? Authorizing Provider  albuterol (VENTOLIN HFA) 108 (90 Base) MCG/ACT inhaler Inhale 2 puffs into the lungs every 6 (six) hours as needed for wheezing.   Yes [provider]  aspirin 81 MG tablet Take 81 mg by mouth daily.   Yes [provider]  Biotin 5000 MCG TABS Take 5,000 mcg by mouth daily. \   Yes [provider]  CINNAMON PO Take 1,000 mg by mouth daily.   Yes [provider]  flecainide (TAMBOCOR) 50 MG tablet TAKE 1 TABLET BY MOUTH TWICE A DAY MAY TAKE 1 ADDITIONAL TABLET AS NEEDED FOR FAST HEART BEAT 03/31/22  Yes Marinus Maw, MD  Glucosamine-Chondroitin (OSTEO BI-FLEX REGULAR STRENGTH PO) Take 1 tablet by mouth daily.   Yes [provider]  hydrochlorothiazide (HYDRODIURIL) 25 MG tablet Take 1 tablet by mouth daily.   Yes [provider]  mirtazapine (REMERON) 15 MG tablet Take 7.5 mg by mouth at bedtime. 12/30/21  Yes [provider]  primidone (MYSOLINE) 50 MG tablet Take 50 mg by mouth at bedtime. 04/08/21  Yes [provider]  rosuvastatin (CRESTOR) 10 MG tablet Take 1 tablet by mouth daily. 08/21/17  Yes [provider]  SYNTHROID 75 MCG tablet Take 1 tablet by mouth daily. 08/21/17  Yes [provider]  verapamil (CALAN-SR) 120 MG CR tablet Take 0.5 tablets (60 mg total) by mouth at bedtime. Patient taking differently: Take 120 mg by mouth at bedtime. 09/29/21  Yes Marinus Maw, MD  Vitamin D, Ergocalciferol, (DRISDOL) 50000 UNITS CAPS Take 50,000 Units by mouth. Every other week on sunday   Yes [provider]   zolpidem (AMBIEN) 10 MG tablet Take 10 mg by mouth at bedtime as needed for sleep.    Yes [provider]  estrogens, conjugated, (PREMARIN) 0.9 MG tablet Take 0.9 mg by mouth as directed. Take daily for 21 days then do not take for 7 days.    [provider]  tiotropium (SPIRIVA HANDIHALER) 18 MCG inhalation capsule PLACE 1 CAPSULE INTO INHALER AND INHALE DAILY. Patient not taking: Reported on 09/29/2021 12/26/17   [name], MD  triamcinolone cream (KENALOG) 0.1 % Apply 1 Application topically  2 (two) times daily. Patient not taking: Reported on 10/03/2022 08/24/22   [provider]    Blood pressure (!) 159/59, pulse (!) 57, temperature (!) 97.5 F (36.4 C), temperature source Oral, resp. rate 16, height 4\' 10"  (1.473 m), weight 49.9 kg, SpO2 93%. Physical Exam: General: pleasant, WD/WN female who is laying in bed in NAD HEENT: head is normocephalic, atraumatic.  Sclera are noninjected.  Pupils equal and round.  Ears and nose without any masses or lesions.  Mouth is pink and moist. Dentition fair Heart: regular, rate, and rhythm Lungs: No wheezing. Respiratory effort nonlabored on room air Abd: well healed laparoscopic incisions, soft, minimal distension, very mild central abdominal TTP without rebound or guarding, no masses, hernias, or organomegaly MS: no BUE/BLE edema, calves soft and nontender Skin: warm and dry with no masses, lesions, or rashes Psych: A&Ox4 with an appropriate affect Neuro: MAEs, no gross motor or sensory deficits BUE/BLE  Results for orders placed or performed during the hospital encounter of 10/03/22 (from the past 48 hour(s))  Lipase, blood     Status: None   Collection Time: 10/03/22  5:54 PM  Result Value Ref Range   Lipase 24 11 - 51 U/L    Comment: Performed at [facility], [address] [place], [place] [address]  Comprehensive metabolic panel     Status: Abnormal   Collection Time: 10/03/22  5:54 PM   Result Value Ref Range   Sodium 134 (L) 135 - 145 mmol/L   Potassium 3.7 3.5 - 5.1 mmol/L   Chloride 101 98 - 111 mmol/L   CO2 22 22 - 32 mmol/L   Glucose, Bld 83 70 - 99 mg/dL    Comment: Glucose reference range applies only to samples taken after fasting for at least 8 hours.   BUN 30 (H) 8 - 23 mg/dL   Creatinine, Ser 6.57 0.44 - 1.00 mg/dL   Calcium 9.0 8.9 - 84.6 mg/dL   Total Protein 6.9 6.5 - 8.1 g/dL   Albumin 3.5 3.5 - 5.0 g/dL   AST 17 15 - 41 U/L   ALT 13 0 - 44 U/L   Alkaline Phosphatase 81 38 - 126 U/L   Total Bilirubin 1.0 0.3 - 1.2 mg/dL   GFR, Estimated >96 >29 mL/min    Comment: (NOTE) Calculated using the CKD-EPI Creatinine Equation (2021)    Anion gap 11 5 - 15    Comment: Performed at [facility], [address], [place], [place] [address]  CBC     Status: Abnormal   Collection Time: 10/03/22  5:54 PM  Result Value Ref Range   WBC 10.2 4.0 - 10.5 K/uL   RBC 5.15 (H) 3.87 - 5.11 MIL/uL   Hemoglobin 14.1 12.0 - 15.0 g/dL   HCT 32.4 40.1 - 02.7 %   MCV 86.4 80.0 - 100.0 fL   MCH 27.4 26.0 - 34.0 pg   MCHC 31.7 30.0 - 36.0 g/dL   RDW 25.3 66.4 - 40.3 %   Platelets 272 150 - 400 K/uL   nRBC 0.0 0.0 - 0.2 %    Comment: Performed at [facility], [address] [place], [place] [address]  Urinalysis, Routine w reflex microscopic -Urine, Clean Catch     Status: Abnormal   Collection Time: 10/03/22  7:59 PM  Result Value Ref Range   Color, Urine YELLOW YELLOW   APPearance CLOUDY (A) CLEAR   Specific Gravity, Urine 1.026 1.005 -  1.030   pH 5.0 5.0 - 8.0   Glucose, UA NEGATIVE NEGATIVE mg/dL   Hgb urine dipstick NEGATIVE NEGATIVE   Bilirubin Urine NEGATIVE NEGATIVE   Ketones, ur 20 (A) NEGATIVE mg/dL   Protein, ur NEGATIVE NEGATIVE mg/dL   Nitrite NEGATIVE NEGATIVE   Leukocytes,Ua NEGATIVE NEGATIVE    Comment: Performed at [facility], [address], [place], [place] [address]  Basic metabolic  panel     Status: Abnormal   Collection Time: 10/04/22  5:42 AM  Result Value Ref Range   Sodium 134 (L) 135 - 145 mmol/L   Potassium 3.6 3.5 - 5.1 mmol/L   Chloride 104 98 - 111 mmol/L   CO2 20 (L) 22 - 32 mmol/L   Glucose, Bld 80 70 - 99 mg/dL    Comment: Glucose reference range applies only to samples taken after fasting for at least 8 hours.   BUN 21 8 - 23 mg/dL   Creatinine, Ser 6.04 0.44 - 1.00 mg/dL   Calcium 8.3 (L) 8.9 - 10.3 mg/dL   GFR, Estimated >54 >09 mL/min    Comment: (NOTE) Calculated using the CKD-EPI Creatinine Equation (2021)    Anion gap 10 5 - 15    Comment: Performed at [facility], [address], [place], [place] [address]  CBC     Status: None   Collection Time: 10/04/22  5:42 AM  Result Value Ref Range   WBC 7.5 4.0 - 10.5 K/uL   RBC 4.58 3.87 - 5.11 MIL/uL   Hemoglobin 12.7 12.0 - 15.0 g/dL   HCT 47.8 29.5 - 62.1 %   MCV 90.6 80.0 - 100.0 fL   MCH 27.7 26.0 - 34.0 pg   MCHC 30.6 30.0 - 36.0 g/dL   RDW 30.8 65.7 - 84.6 %   Platelets 217 150 - 400 K/uL   nRBC 0.0 0.0 - 0.2 %    Comment: Performed at [facility], [address], [place], [place] [address]  TSH     Status: None   Collection Time: 10/04/22  5:42 AM  Result Value Ref Range   TSH 3.431 0.350 - 4.500 uIU/mL    Comment: Performed by a 3rd Generation assay with a functional sensitivity of <=0.01 uIU/mL. Performed at [facility], [address], [place], [place] [address]    CT ABDOMEN PELVIS W CONTRAST  Result Date: 10/03/2022 CLINICAL DATA:  Concern for bowel obstruction. EXAM: CT ABDOMEN AND PELVIS WITH CONTRAST TECHNIQUE: Multidetector CT imaging of the abdomen and pelvis was performed using the standard protocol following bolus administration of intravenous contrast. RADIATION DOSE REDUCTION: This exam was performed according to the departmental dose-optimization program which includes automated exposure control, adjustment of  the mA and/or kV according to patient size and/or use of iterative reconstruction technique. CONTRAST:  OMNIPAQUE IOHEXOL 300 MG/ML  SOLN COMPARISON:  CT abdomen pelvis dated 02/01/2006. FINDINGS: Lower chest: The visualized lung bases are clear. There is coronary vascular calcification. No intra-abdominal free air or free fluid. Hepatobiliary: The liver is unremarkable. No related patient. The gallbladder is unremarkable. Pancreas: Unremarkable. No pancreatic ductal dilatation or surrounding inflammatory changes. Spleen: Normal in size without focal abnormality. Adrenals/Urinary Tract: The adrenal glands are unremarkable. Small bilateral renal cysts suboptimally evaluated on this CT. This can be better evaluated with ultrasound on a nonemergent/outpatient basis. There is no hydronephrosis on either side. There is symmetric enhancement and excretion of contrast by both kidneys. The visualized ureters  and urinary bladder appear unremarkable. Stomach/Bowel: Multiple mildly dilated loops of small bowel measure up to 3.7 cm. A transition is noted in the right lower quadrant (56/2 and coronal 52/7) likely representing adhesions. The distal small bowel are collapsed. The appendix is not visualized with certainty. No inflammatory changes identified in the right lower quadrant. Vascular/Lymphatic: Advanced aortoiliac atherosclerotic disease. The IVC is unremarkable. No portal venous gas. There is no adenopathy. Reproductive: Hysterectomy.  No adnexal masses. Other: None Musculoskeletal: Osteopenia with degenerative changes. No acute osseous pathology. IMPRESSION: 1. Small-bowel obstruction with a transition in the right lower quadrant likely secondary to adhesions. 2.  Aortic Atherosclerosis (ICD10-I70.0). Electronically Signed   By: Elgie Collard M.D.   On: 10/03/2022 20:39      Assessment/Plan SBO, likely due to adhesions - CT scan 8/11 shows Small-bowel obstruction with a transition in the right  lower quadrant likely secondary to adhesions - WBC WNL, VSS, no peritonitis on exam. No indication for acute surgical intervention - Patient is tolerating liquids and passing some flatus, but continues to have intermittent crampy abdominal pain consistent with partial SBO. Will let her try taking oral gastrograffin and plan for 8 hour delayed film. If at any point she has worsening abdominal pain, nausea, or vomiting will need to place NG tube. Hopefully this will resolve without surgery. We will follow.  ID - none indicated VTE - SCDs, lovenox FEN - IVF, CLD Foley - none  COPD HTN HLD Hypothyroidism  I reviewed hospitalist notes, last 24 h vitals and pain scores, last 48 h intake and output, last 24 h labs and trends, and last 24 h imaging results.   Franne Forts, PA-C [facility] Surgery 10/04/2022, 9:50 AM Please see Amion for pager number during day hours 7:00am-4:30pm

## 2022-10-04 NOTE — Progress Notes (Signed)
   10/04/22 1123  TOC Brief Assessment  Insurance and Status Reviewed  Patient has primary care physician Yes  Home environment has been reviewed Home alone  Prior level of function: Independent  Prior/Current Home Services No current home services  Social Determinants of Health Reivew SDOH reviewed no interventions necessary  Readmission risk has been reviewed Yes  Transition of care needs no transition of care needs at this time

## 2022-10-04 NOTE — Plan of Care (Signed)

## 2022-10-04 NOTE — Progress Notes (Signed)
PROGRESS NOTE  Kirsten [name] ZOX:[id number] DOB: [birth year] DOA: 10/03/2022 PCP: [name]., MD   LOS: 1 day   Brief Narrative / Interim history: 82 year old female with history of COPD, hypertension, hypothyroidism comes to the hospital with abdominal discomfort, nausea.  This has been going on for the past 3 to 4 days, and due to persistent symptoms decided to come to the hospital.  Of note, she underwent a robotic cervical plexi in October 2023 at [facility], hospital course complicated by prolonged bowel obstruction s/p surgical repair on 12/10/2022 LOA, repair small bowel enterotomy.  CT scan during this admission showed small bowel obstruction with transition point in the right lower quadrant.  General surgery was consulted and she was admitted to the hospital  Subjective / 24h Interval events: No vomiting, mild nausea intermittently.  He is passing gas but had not had a bowel movement  Assesement and Plan: Principal Problem:   SBO (small bowel obstruction) (HCC) Active Problems:   PVC's (premature ventricular contractions)   Hypertension   COPD with asthma   OSA (obstructive sleep apnea)   Principal problem Small bowel obstruction -likely due to adhesions, general surgery consulted, appreciate input.  She does not have significant nausea or vomiting, no need for NG tube, continue supportive care.  Active problems COPD-respiratory status stable, no wheezing  Essential hypertension -continue home medications  Hypothyroidism-continue Synthroid  Hyperlipidemia-continue statin  Palpitations, PVCs-followed by cardiology, continue low-dose flecainide  Scheduled Meds:  aspirin EC  81 mg Oral Daily   diatrizoate meglumine-sodium  90 mL Oral Once   enoxaparin (LOVENOX) injection  40 mg Subcutaneous Q24H   flecainide  50 mg Oral Q12H   hydrochlorothiazide  25 mg Oral Daily   levothyroxine  75 mcg Oral Q0600   primidone  50 mg Oral Daily   rosuvastatin  10 mg Oral Daily    verapamil  60 mg Oral QHS   Continuous Infusions:  lactated ringers 75 mL/hr at 10/04/22 0420   PRN Meds:.acetaminophen **OR** acetaminophen, albuterol, fentaNYL (SUBLIMAZE) injection, HYDROcodone-acetaminophen, metoprolol tartrate, polyethylene glycol, traZODone  Current Outpatient Medications  Medication Instructions   albuterol (VENTOLIN HFA) 108 (90 Base) MCG/ACT inhaler 2 puffs, Inhalation, Every 6 hours PRN   aspirin 81 mg, Oral, Daily,     Biotin 5,000 mcg, Oral, Daily, \   CINNAMON PO 1,000 mg, Oral, Daily,     estrogens (conjugated) (PREMARIN) 0.9 mg, Oral, As directed, Take daily for 21 days then do not take for 7 days.    flecainide (TAMBOCOR) 50 MG tablet TAKE 1 TABLET BY MOUTH TWICE A DAY MAY TAKE 1 ADDITIONAL TABLET AS NEEDED FOR FAST HEART BEAT   Glucosamine-Chondroitin (OSTEO BI-FLEX REGULAR STRENGTH PO) 1 tablet, Oral, Daily   hydrochlorothiazide (HYDRODIURIL) 25 MG tablet 1 tablet, Oral, Daily   mirtazapine (REMERON) 7.5 mg, Oral, Daily at bedtime   primidone (MYSOLINE) 50 mg, Oral, Nightly   rosuvastatin (CRESTOR) 10 MG tablet 1 tablet, Oral, Daily   SYNTHROID 75 MCG tablet 1 tablet, Oral, Daily   tiotropium (SPIRIVA HANDIHALER) 18 MCG inhalation capsule PLACE 1 CAPSULE INTO INHALER AND INHALE DAILY.   triamcinolone cream (KENALOG) 0.1 % 1 Application, 2 times daily   verapamil (CALAN-SR) 60 mg, Oral, Daily at bedtime   Vitamin D (Ergocalciferol) (DRISDOL) 50,000 Units, Oral, Every other week on sunday   zolpidem (AMBIEN) 10 mg, Oral, At bedtime PRN    Diet Orders (From admission, onward)     Start     Ordered  10/03/22 2219  Diet clear liquid Room service appropriate? Yes; Fluid consistency: Thin  Diet effective now       Question Answer Comment  Room service appropriate? Yes   Fluid consistency: Thin      10/03/22 2220            DVT prophylaxis: enoxaparin (LOVENOX) injection 40 mg Start: 10/03/22 2230   Lab Results  Component Value Date    PLT 217 10/04/2022      Code Status: DNR  Family Communication: no family at bedside  Status is: Inpatient Remains inpatient appropriate because: severity of illness  Level of care: Med-Surg  Consultants:  General surgery   Objective: Vitals:   10/03/22 2328 10/03/22 2346 10/04/22 0342 10/04/22 0835  BP:  (!) 167/69 (!) 133/56 (!) 159/59  Pulse:  72 (!) 55 (!) 57  Resp:  20 20 16   Temp: 98.3 F (36.8 C) 98 F (36.7 C) 97.8 F (36.6 C) (!) 97.5 F (36.4 C)  TempSrc:  Oral Oral Oral  SpO2:  91% 96% 93%  Weight:  49.9 kg    Height:  4\' 10"  (1.473 m)      Intake/Output Summary (Last 24 hours) at 10/04/2022 1051 Last data filed at 10/04/2022 0420 Gross per 24 hour  Intake 315.72 ml  Output --  Net 315.72 ml   Wt Readings from Last 3 Encounters:  10/03/22 49.9 kg  09/29/21 44.5 kg  06/25/20 63.8 kg    Examination:  Constitutional: NAD Eyes: no scleral icterus ENMT: Mucous membranes are moist.  Neck: normal, supple Respiratory: clear to auscultation bilaterally, no wheezing, no crackles. Normal respiratory effort. No accessory muscle use.  Cardiovascular: Regular rate and rhythm, no murmurs / rubs / gallops. No LE edema.  Abdomen: non distended, no tenderness. Bowel sounds positive.  Musculoskeletal: no clubbing / cyanosis.   Data Reviewed: I have independently reviewed following labs and imaging studies   CBC Recent Labs  Lab 10/03/22 1754 10/04/22 0542  WBC 10.2 7.5  HGB 14.1 12.7  HCT 44.5 41.5  PLT 272 217  MCV 86.4 90.6  MCH 27.4 27.7  MCHC 31.7 30.6  RDW 13.7 13.9    Recent Labs  Lab 10/03/22 1754 10/04/22 0542  NA 134* 134*  K 3.7 3.6  CL 101 104  CO2 22 20*  GLUCOSE 83 80  BUN 30* 21  CREATININE 0.77 0.60  CALCIUM 9.0 8.3*  AST 17  --   ALT 13  --   ALKPHOS 81  --   BILITOT 1.0  --   ALBUMIN 3.5  --   TSH  --  3.431     ------------------------------------------------------------------------------------------------------------------ No results for input(s): "CHOL", "HDL", "LDLCALC", "TRIG", "CHOLHDL", "LDLDIRECT" in the last 72 hours.  No results found for: "HGBA1C" ------------------------------------------------------------------------------------------------------------------ Recent Labs    10/04/22 0542  TSH 3.431    Cardiac Enzymes No results for input(s): "CKMB", "TROPONINI", "MYOGLOBIN" in the last 168 hours.  Invalid input(s): "CK" ------------------------------------------------------------------------------------------------------------------ No results found for: "BNP"  CBG: No results for input(s): "GLUCAP" in the last 168 hours.  No results found for this or any previous visit (from the past 240 hour(s)).   Radiology Studies: CT ABDOMEN PELVIS W CONTRAST  Result Date: 10/03/2022 CLINICAL DATA:  Concern for bowel obstruction. EXAM: CT ABDOMEN AND PELVIS WITH CONTRAST TECHNIQUE: Multidetector CT imaging of the abdomen and pelvis was performed using the standard protocol following bolus administration of intravenous contrast. RADIATION DOSE REDUCTION: This exam was performed according to the  departmental dose-optimization program which includes automated exposure control, adjustment of the mA and/or kV according to patient size and/or use of iterative reconstruction technique. CONTRAST:  OMNIPAQUE IOHEXOL 300 MG/ML  SOLN COMPARISON:  CT abdomen pelvis dated 02/01/2006. FINDINGS: Lower chest: The visualized lung bases are clear. There is coronary vascular calcification. No intra-abdominal free air or free fluid. Hepatobiliary: The liver is unremarkable. No related patient. The gallbladder is unremarkable. Pancreas: Unremarkable. No pancreatic ductal dilatation or surrounding inflammatory changes. Spleen: Normal in size without focal abnormality. Adrenals/Urinary Tract: The adrenal  glands are unremarkable. Small bilateral renal cysts suboptimally evaluated on this CT. This can be better evaluated with ultrasound on a nonemergent/outpatient basis. There is no hydronephrosis on either side. There is symmetric enhancement and excretion of contrast by both kidneys. The visualized ureters and urinary bladder appear unremarkable. Stomach/Bowel: Multiple mildly dilated loops of small bowel measure up to 3.7 cm. A transition is noted in the right lower quadrant (56/2 and coronal 52/7) likely representing adhesions. The distal small bowel are collapsed. The appendix is not visualized with certainty. No inflammatory changes identified in the right lower quadrant. Vascular/Lymphatic: Advanced aortoiliac atherosclerotic disease. The IVC is unremarkable. No portal venous gas. There is no adenopathy. Reproductive: Hysterectomy.  No adnexal masses. Other: None Musculoskeletal: Osteopenia with degenerative changes. No acute osseous pathology. IMPRESSION: 1. Small-bowel obstruction with a transition in the right lower quadrant likely secondary to adhesions. 2.  Aortic Atherosclerosis (ICD10-I70.0). Electronically Signed   By: Elgie Collard M.D.   On: 10/03/2022 20:39     Pamella Pert, MD, PhD [facility]  Between 7 am - 7 pm I am available, please contact me via Amion (for emergencies) or Securechat (non urgent messages)  Between 7 pm - 7 am I am not available, please contact night coverage MD/APP via Amion

## 2022-10-04 NOTE — Progress Notes (Signed)
Mobility Specialist - Progress Note   10/04/22 1038  Mobility  Activity Ambulated with assistance in hallway  Level of Assistance Standby assist, set-up cues, supervision of patient - no hands on  Assistive Device None  Distance Ambulated (ft) 500 ft  Range of Motion/Exercises Active  Activity Response Tolerated well  Mobility Referral Yes  $Mobility charge 1 Mobility  Mobility Specialist Start Time (ACUTE ONLY) 1010  Mobility Specialist Stop Time (ACUTE ONLY) 1020  Mobility Specialist Time Calculation (min) (ACUTE ONLY) 10 min   Pt received in bed and agreed to mobility. Had no issues and returned to bed with all need met.  Marilynne Halsted Mobility Specialist

## 2022-10-05 ENCOUNTER — Inpatient Hospital Stay (HOSPITAL_COMMUNITY): Payer: Medicare Other

## 2022-10-05 DIAGNOSIS — K56609 Unspecified intestinal obstruction, unspecified as to partial versus complete obstruction: Secondary | ICD-10-CM | POA: Diagnosis not present

## 2022-10-05 MED ORDER — IPRATROPIUM-ALBUTEROL 0.5-2.5 (3) MG/3ML IN SOLN: 3.0000 mL | RESPIRATORY_TRACT | Status: DC | PRN

## 2022-10-05 MED ORDER — SENNOSIDES-DOCUSATE SODIUM 8.6-50 MG PO TABS: 1.0000 | ORAL_TABLET | Freq: Every evening | ORAL | Status: DC | PRN

## 2022-10-05 MED ORDER — MAGNESIUM SULFATE 2 GM/50ML IV SOLN
2.0000 g | Freq: Once | INTRAVENOUS | Status: AC
  Administered 2022-10-05: 2 g via INTRAVENOUS
  Filled 2022-10-05: qty 50

## 2022-10-05 MED ORDER — GUAIFENESIN 100 MG/5ML PO LIQD: 5.0000 mL | ORAL | Status: DC | PRN

## 2022-10-05 MED ORDER — HYDRALAZINE HCL 20 MG/ML IJ SOLN: 10.0000 mg | INTRAMUSCULAR | Status: DC | PRN

## 2022-10-05 NOTE — Progress Notes (Signed)
NT reported to this RN that pt had large liquid stool, incontinent episode.  Pt's pain has been minimal since last pain medication given per MAR.  Pt also had 1 episode of vomiting around midnight, but nothing since.  Pt did report relief in cramping after vomiting episode.  Will continue to monitor.

## 2022-10-05 NOTE — Progress Notes (Signed)
PROGRESS NOTE    Kirsten [name]  ZOX:[id number] DOB: 1940/03/21 DOA: 10/03/2022 PCP: [name]., MD   Brief Narrative:  82 year old female with history of COPD, hypertension, hypothyroidism comes to the hospital with abdominal discomfort, nausea. This has been going on for the past 3 to 4 days, and due to persistent symptoms decided to come to the hospital. Of note, she underwent a robotic cervical plexi in October 2023 at [facility], hospital course complicated by prolonged bowel obstruction s/p surgical repair on 12/10/2022 LOA, repair small bowel enterotomy. CT scan during this admission showed small bowel obstruction with transition point in the right lower quadrant. General surgery was consulted and she was admitted to the hospital.  Plans for conservative management.   Assessment & Plan:  Principal Problem:   SBO (small bowel obstruction) (HCC) Active Problems:   PVC's (premature ventricular contractions)   Hypertension   COPD with asthma   OSA (obstructive sleep apnea)     Assessment and Plan: * SBO (small bowel obstruction) (HCC) Secondary to previous adhesions.  Currently planning on conservative management.  Received Gastrografin with no contrast in the colon.  Another x-ray planned later today.  OSA (obstructive sleep apnea) Has dental device but declines treatment here.  Follow-up outpatient  COPD with asthma As needed albuterol as used at home  Hypertension Continue home meds when able.  IV as needed  PVC's (premature ventricular contractions) Continue flecainide  Hypothyroidism - Synthroid  Hypomagnesemia - Repletion      DVT prophylaxis: enoxaparin (LOVENOX) injection 40 mg Start: 10/03/22 2230   Code Status: DNR Family Communication:   Status is: Inpatient Remains inpatient appropriate because: Awaiting bowel function to return       Diet Orders (From admission, onward)     Start     Ordered   10/03/22 2219  Diet clear liquid Room  service appropriate? Yes; Fluid consistency: Thin  Diet effective now       Question Answer Comment  Room service appropriate? Yes   Fluid consistency: Thin      10/03/22 2220            Subjective:  Feels a little better, no other complaints  Examination:  General exam: Appears calm and comfortable, NG tube clamped Respiratory system: Clear to auscultation. Respiratory effort normal. Cardiovascular system: S1 & S2 heard, RRR. No JVD, murmurs, rubs, gallops or clicks. No pedal edema. Gastrointestinal system: Abdomen is nondistended, soft and nontender. No organomegaly or masses felt. Normal bowel sounds heard. Central nervous system: Alert and oriented. No focal neurological deficits. Extremities: Symmetric 5 x 5 power. Skin: No rashes, lesions or ulcers Psychiatry: Judgement and insight appear normal. Mood & affect appropriate.  Objective: Vitals:   10/04/22 0835 10/04/22 1612 10/04/22 1958 10/05/22 0447  BP: (!) 159/59 (!) 173/57 (!) 151/51 (!) 158/113  Pulse: (!) 57 (!) 58 66 79  Resp: 16 16 16 16   Temp: (!) 97.5 F (36.4 C) 98.1 F (36.7 C) 98.2 F (36.8 C) 98 F (36.7 C)  TempSrc: Oral Oral    SpO2: 93% 93% (!) 81% 98%  Weight:      Height:       No intake or output data in the 24 hours ending 10/05/22 0941 Filed Weights   10/03/22 1751 10/03/22 2346  Weight: 47.6 kg 49.9 kg    Scheduled Meds:  aspirin EC  81 mg Oral Daily   enoxaparin (LOVENOX) injection  40 mg Subcutaneous Q24H   flecainide  50  mg Oral Q12H   hydrochlorothiazide  25 mg Oral Daily   levothyroxine  75 mcg Oral Q0600   primidone  50 mg Oral Daily   rosuvastatin  10 mg Oral Daily   verapamil  60 mg Oral QHS   Continuous Infusions:  lactated ringers 75 mL/hr at 10/04/22 0420    Nutritional status     Body mass index is 22.99 kg/m.  Data Reviewed:   CBC: Recent Labs  Lab 10/03/22 1754 10/04/22 0542 10/05/22 0607  WBC 10.2 7.5 12.0*  HGB 14.1 12.7 15.0  HCT 44.5 41.5  47.1*  MCV 86.4 90.6 88.7  PLT 272 217 259   Basic Metabolic Panel: Recent Labs  Lab 10/03/22 1754 10/04/22 0542 10/05/22 0607  NA 134* 134* 137  K 3.7 3.6 3.9  CL 101 104 101  CO2 22 20* 23  GLUCOSE 83 80 95  BUN 30* 21 19  CREATININE 0.77 0.60 0.83  CALCIUM 9.0 8.3* 8.7*  MG  --   --  1.6*  PHOS  --   --  4.2   GFR: Estimated Creatinine Clearance: 38 mL/min (by C-G formula based on SCr of 0.83 mg/dL). Liver Function Tests: Recent Labs  Lab 10/03/22 1754 10/05/22 0607  AST 17 22  ALT 13 14  ALKPHOS 81 72  BILITOT 1.0 0.6  PROT 6.9 6.1*  ALBUMIN 3.5 3.1*   Recent Labs  Lab 10/03/22 1754  LIPASE 24   No results for input(s): "AMMONIA" in the last 168 hours. Coagulation Profile: No results for input(s): "INR", "PROTIME" in the last 168 hours. Cardiac Enzymes: No results for input(s): "CKTOTAL", "CKMB", "CKMBINDEX", "TROPONINI" in the last 168 hours. BNP (last 3 results) No results for input(s): "PROBNP" in the last 8760 hours. HbA1C: No results for input(s): "HGBA1C" in the last 72 hours. CBG: No results for input(s): "GLUCAP" in the last 168 hours. Lipid Profile: No results for input(s): "CHOL", "HDL", "LDLCALC", "TRIG", "CHOLHDL", "LDLDIRECT" in the last 72 hours. Thyroid Function Tests: Recent Labs    10/04/22 0542  TSH 3.431   Anemia Panel: No results for input(s): "VITAMINB12", "FOLATE", "FERRITIN", "TIBC", "IRON", "RETICCTPCT" in the last 72 hours. Sepsis Labs: No results for input(s): "PROCALCITON", "LATICACIDVEN" in the last 168 hours.  No results found for this or any previous visit (from the past 240 hour(s)).       Radiology Studies: DG Abd Portable 1V-Small Bowel Obstruction Protocol-initial, 8 hr delay  Result Date: 10/04/2022 CLINICAL DATA:  8 hour small-bowel follow-up study EXAM: PORTABLE ABDOMEN - 1 VIEW COMPARISON:  None Available. FINDINGS: Previously administered contrast material lies within the stomach as well as multiple  dilated loops of small bowel. No significant colonic contrast is noted. IMPRESSION: Persistent small bowel dilatation containing contrast. No colonic contrast is seen. 24 hour follow-up film is recommended. Electronically Signed   By: Alcide Clever M.D.   On: 10/04/2022 21:56   CT ABDOMEN PELVIS W CONTRAST  Result Date: 10/03/2022 CLINICAL DATA:  Concern for bowel obstruction. EXAM: CT ABDOMEN AND PELVIS WITH CONTRAST TECHNIQUE: Multidetector CT imaging of the abdomen and pelvis was performed using the standard protocol following bolus administration of intravenous contrast. RADIATION DOSE REDUCTION: This exam was performed according to the departmental dose-optimization program which includes automated exposure control, adjustment of the mA and/or kV according to patient size and/or use of iterative reconstruction technique. CONTRAST:  OMNIPAQUE IOHEXOL 300 MG/ML  SOLN COMPARISON:  CT abdomen pelvis dated 02/01/2006. FINDINGS: Lower chest: The visualized lung  bases are clear. There is coronary vascular calcification. No intra-abdominal free air or free fluid. Hepatobiliary: The liver is unremarkable. No related patient. The gallbladder is unremarkable. Pancreas: Unremarkable. No pancreatic ductal dilatation or surrounding inflammatory changes. Spleen: Normal in size without focal abnormality. Adrenals/Urinary Tract: The adrenal glands are unremarkable. Small bilateral renal cysts suboptimally evaluated on this CT. This can be better evaluated with ultrasound on a nonemergent/outpatient basis. There is no hydronephrosis on either side. There is symmetric enhancement and excretion of contrast by both kidneys. The visualized ureters and urinary bladder appear unremarkable. Stomach/Bowel: Multiple mildly dilated loops of small bowel measure up to 3.7 cm. A transition is noted in the right lower quadrant (56/2 and coronal 52/7) likely representing adhesions. The distal small bowel are collapsed. The appendix is  not visualized with certainty. No inflammatory changes identified in the right lower quadrant. Vascular/Lymphatic: Advanced aortoiliac atherosclerotic disease. The IVC is unremarkable. No portal venous gas. There is no adenopathy. Reproductive: Hysterectomy.  No adnexal masses. Other: None Musculoskeletal: Osteopenia with degenerative changes. No acute osseous pathology. IMPRESSION: 1. Small-bowel obstruction with a transition in the right lower quadrant likely secondary to adhesions. 2.  Aortic Atherosclerosis (ICD10-I70.0). Electronically Signed   By: Elgie Collard M.D.   On: 10/03/2022 20:39           LOS: 2 days   Time spent= 35 mins     Joline Maxcy, MD [facility]  If 7PM-7AM, please contact night-coverage  10/05/2022, 9:41 AM

## 2022-10-05 NOTE — Progress Notes (Signed)
Mobility Specialist - Progress Note   10/05/22 1020  Mobility  Activity Ambulated with assistance in hallway  Level of Assistance Independent after set-up  Assistive Device None  Distance Ambulated (ft) 500 ft  Range of Motion/Exercises Active  Activity Response Tolerated well  Mobility Referral Yes  $Mobility charge 1 Mobility  Mobility Specialist Start Time (ACUTE ONLY) 1005  Mobility Specialist Stop Time (ACUTE ONLY) 1020  Mobility Specialist Time Calculation (min) (ACUTE ONLY) 15 min   Pt received in bed and agreed to mobility, had no issues throughout session, returned to bed with all needs met.  Marilynne Halsted Mobility Specialist

## 2022-10-05 NOTE — Progress Notes (Signed)
Progress Note     Subjective: Pt reports she is having diarrhea and passing flatus with BMs. She did have some nausea with drinking gastrografin. Feels like abdominal distention and pain are better.   Objective: Vital signs in last 24 hours: Temp:  [98 F (36.7 C)-98.2 F (36.8 C)] 98 F (36.7 C) (08/13 0447) Pulse Rate:  [58-79] 79 (08/13 0447) Resp:  [16] 16 (08/13 0447) BP: (151-173)/(51-113) 158/113 (08/13 0447) SpO2:  [81 %-98 %] 98 % (08/13 0447) Last BM Date : 10/05/22  Intake/Output from previous day: No intake/output data recorded. Intake/Output this shift: No intake/output data recorded.  PE: General: pleasant, WD, thin female who is laying in bed in NAD Heart: regular, rate, and rhythm.   Lungs: Respiratory effort nonlabored Abd: soft, mild generalized ttp without peritonitis, mild distention, normoactive BS Psych: A&Ox3 with an appropriate affect.    Lab Results:  Recent Labs    10/04/22 0542 10/05/22 0607  WBC 7.5 12.0*  HGB 12.7 15.0  HCT 41.5 47.1*  PLT 217 259   BMET Recent Labs    10/04/22 0542 10/05/22 0607  NA 134* 137  K 3.6 3.9  CL 104 101  CO2 20* 23  GLUCOSE 80 95  BUN 21 19  CREATININE 0.60 0.83  CALCIUM 8.3* 8.7*   PT/INR No results for input(s): "LABPROT", "INR" in the last 72 hours. CMP     Component Value Date/Time   NA 137 10/05/2022 0607   K 3.9 10/05/2022 0607   CL 101 10/05/2022 0607   CO2 23 10/05/2022 0607   GLUCOSE 95 10/05/2022 0607   BUN 19 10/05/2022 0607   CREATININE 0.83 10/05/2022 0607   CALCIUM 8.7 (L) 10/05/2022 0607   PROT 6.1 (L) 10/05/2022 0607   ALBUMIN 3.1 (L) 10/05/2022 0607   AST 22 10/05/2022 0607   ALT 14 10/05/2022 0607   ALKPHOS 72 10/05/2022 0607   BILITOT 0.6 10/05/2022 0607   GFRNONAA >60 10/05/2022 0607   Lipase     Component Value Date/Time   LIPASE 24 10/03/2022 1754       Studies/Results: DG Abd Portable 1V-Small Bowel Obstruction Protocol-initial, 8 hr  delay  Result Date: 10/04/2022 CLINICAL DATA:  8 hour small-bowel follow-up study EXAM: PORTABLE ABDOMEN - 1 VIEW COMPARISON:  None Available. FINDINGS: Previously administered contrast material lies within the stomach as well as multiple dilated loops of small bowel. No significant colonic contrast is noted. IMPRESSION: Persistent small bowel dilatation containing contrast. No colonic contrast is seen. 24 hour follow-up film is recommended. Electronically Signed   By: Alcide Clever M.D.   On: 10/04/2022 21:56   CT ABDOMEN PELVIS W CONTRAST  Result Date: 10/03/2022 CLINICAL DATA:  Concern for bowel obstruction. EXAM: CT ABDOMEN AND PELVIS WITH CONTRAST TECHNIQUE: Multidetector CT imaging of the abdomen and pelvis was performed using the standard protocol following bolus administration of intravenous contrast. RADIATION DOSE REDUCTION: This exam was performed according to the departmental dose-optimization program which includes automated exposure control, adjustment of the mA and/or kV according to patient size and/or use of iterative reconstruction technique. CONTRAST:  OMNIPAQUE IOHEXOL 300 MG/ML  SOLN COMPARISON:  CT abdomen pelvis dated 02/01/2006. FINDINGS: Lower chest: The visualized lung bases are clear. There is coronary vascular calcification. No intra-abdominal free air or free fluid. Hepatobiliary: The liver is unremarkable. No related patient. The gallbladder is unremarkable. Pancreas: Unremarkable. No pancreatic ductal dilatation or surrounding inflammatory changes. Spleen: Normal in size without focal abnormality. Adrenals/Urinary Tract: The  adrenal glands are unremarkable. Small bilateral renal cysts suboptimally evaluated on this CT. This can be better evaluated with ultrasound on a nonemergent/outpatient basis. There is no hydronephrosis on either side. There is symmetric enhancement and excretion of contrast by both kidneys. The visualized ureters and urinary bladder appear unremarkable.  Stomach/Bowel: Multiple mildly dilated loops of small bowel measure up to 3.7 cm. A transition is noted in the right lower quadrant (56/2 and coronal 52/7) likely representing adhesions. The distal small bowel are collapsed. The appendix is not visualized with certainty. No inflammatory changes identified in the right lower quadrant. Vascular/Lymphatic: Advanced aortoiliac atherosclerotic disease. The IVC is unremarkable. No portal venous gas. There is no adenopathy. Reproductive: Hysterectomy.  No adnexal masses. Other: None Musculoskeletal: Osteopenia with degenerative changes. No acute osseous pathology. IMPRESSION: 1. Small-bowel obstruction with a transition in the right lower quadrant likely secondary to adhesions. 2.  Aortic Atherosclerosis (ICD10-I70.0). Electronically Signed   By: Elgie Collard M.D.   On: 10/03/2022 20:39    Anti-infectives: Anti-infectives (From admission, onward)    None        Assessment/Plan  SBO, likely due to adhesions - CT scan 8/11 shows Small-bowel obstruction with a transition in the right lower quadrant likely secondary to adhesions - WBC 12, VSS, no peritonitis on exam. No indication for acute surgical intervention - 8h delay with contrast in stomach and persistent bowel dilation  - Patient is having diarrhea and passing flatus, did vomit once but tolerating some CLD. Denies worsening abdominal pain this AM and feels like distention is improving.  - FLD and repeat film at the 24 hr point   ID - none indicated VTE - SCDs, lovenox FEN - IVF, FLD Foley - none   - per [facility] -  COPD HTN HLD Hypothyroidism   LOS: 2 days   I reviewed hospitalist notes, last 24 h vitals and pain scores, last 48 h intake and output, last 24 h labs and trends, and last 24 h imaging results.   Juliet Rude, [facility] Surgery 10/05/2022, 10:40 AM Please see Amion for pager number during day hours 7:00am-4:30pm

## 2022-10-05 NOTE — Plan of Care (Signed)

## 2022-10-06 DIAGNOSIS — K56609 Unspecified intestinal obstruction, unspecified as to partial versus complete obstruction: Secondary | ICD-10-CM | POA: Diagnosis not present

## 2022-10-06 MED ORDER — POTASSIUM CHLORIDE CRYS ER 20 MEQ PO TBCR
40.0000 meq | EXTENDED_RELEASE_TABLET | Freq: Once | ORAL | Status: AC
  Administered 2022-10-06: 40 meq via ORAL
  Filled 2022-10-06: qty 2

## 2022-10-06 MED ORDER — K PHOS MONO-SOD PHOS DI & MONO 155-852-130 MG PO TABS
500.0000 mg | ORAL_TABLET | Freq: Once | ORAL | Status: AC
  Administered 2022-10-06: 500 mg via ORAL
  Filled 2022-10-06: qty 2

## 2022-10-06 NOTE — Discharge Summary (Signed)
Physician Discharge Summary  Kirsten [name] ZOX:[id number] DOB: 25-Dec-1940 DOA: 10/03/2022  PCP: [name]., MD  Admit date: 10/03/2022 Discharge date: 10/06/2022  Admitted From: Home Disposition:  Home  Recommendations for Outpatient Follow-up:  Follow up with PCP in 1-2 weeks Please obtain BMP/CBC in one week your next doctors visit.     Discharge Condition: Stable CODE STATUS: DNR Diet recommendation: Low-salt  Brief/Interim Summary:  82 year old female with history of COPD, hypertension, hypothyroidism comes to the hospital with abdominal discomfort, nausea. This has been going on for the past 3 to 4 days, and due to persistent symptoms decided to come to the hospital. Of note, she underwent a robotic cervical plexi in October 2023 at [facility], hospital course complicated by prolonged bowel obstruction s/p surgical repair on 12/10/2022 LOA, repair small bowel enterotomy. CT scan during this admission showed small bowel obstruction with transition point in the right lower quadrant. General surgery was consulted and she was admitted to the hospital.  Plans for conservative management. Over the course of 48 hours patient improved with conservative management and doing significantly well today tolerating oral without any issues.  Will discharge her in stable condition.   Assessment & Plan:  Principal Problem:   SBO (small bowel obstruction) (HCC) Active Problems:   PVC's (premature ventricular contractions)   Hypertension   COPD with asthma   OSA (obstructive sleep apnea)       Assessment and Plan: * SBO (small bowel obstruction) (HCC) Secondary to previous adhesions.  Patient was managed conservatively with the help of general surgery team.  Medically stable for discharge today as she is tolerating orals without any issues   OSA (obstructive sleep apnea) Has dental device but declines treatment here.  Follow-up outpatient   COPD with asthma As needed albuterol as used  at home   Hypertension Continue home meds when able.    PVC's (premature ventricular contractions) Continue flecainide   Hypothyroidism - Synthroid   Hypomagnesemia - Repletion   Discharge Diagnoses:  Principal Problem:   SBO (small bowel obstruction) (HCC) Active Problems:   PVC's (premature ventricular contractions)   Hypertension   COPD with asthma   OSA (obstructive sleep apnea)      Consultations: General surgery  Subjective: Feeling well wishing to go home  Discharge Exam: Vitals:   10/05/22 2006 10/06/22 0544  BP: (!) 147/62 (!) 163/54  Pulse: 72 62  Resp: 17 17  Temp: (!) 97.5 F (36.4 C) (!) 97.5 F (36.4 C)  SpO2: 99% 96%   Vitals:   10/05/22 0447 10/05/22 1334 10/05/22 2006 10/06/22 0544  BP: (!) 158/113 (!) 144/62 (!) 147/62 (!) 163/54  Pulse: 79 78 72 62  Resp: 16 15 17 17   Temp: 98 F (36.7 C) 98 F (36.7 C) (!) 97.5 F (36.4 C) (!) 97.5 F (36.4 C)  TempSrc:  Oral Oral   SpO2: 98% 95% 99% 96%  Weight:      Height:        General: Pt is alert, awake, not in acute distress Cardiovascular: RRR, S1/S2 +, no rubs, no gallops Respiratory: CTA bilaterally, no wheezing, no rhonchi Abdominal: Soft, NT, ND, bowel sounds + Extremities: no edema, no cyanosis  Discharge Instructions   Allergies as of 10/06/2022   No Known Allergies      Medication List     TAKE these medications    albuterol 108 (90 Base) MCG/ACT inhaler Commonly known as: VENTOLIN HFA Inhale 2 puffs into the lungs every  6 (six) hours as needed for wheezing.   aspirin 81 MG tablet Take 81 mg by mouth daily.   Biotin 5000 MCG Tabs Take 5,000 mcg by mouth daily. \   CINNAMON PO Take 1,000 mg by mouth daily.   estrogens (conjugated) 0.9 MG tablet Commonly known as: PREMARIN Take 0.9 mg by mouth as directed. Take daily for 21 days then do not take for 7 days.   flecainide 50 MG tablet Commonly known as: TAMBOCOR TAKE 1 TABLET BY MOUTH TWICE A DAY MAY TAKE  1 ADDITIONAL TABLET AS NEEDED FOR FAST HEART BEAT   hydrochlorothiazide 25 MG tablet Commonly known as: HYDRODIURIL Take 1 tablet by mouth daily.   mirtazapine 15 MG tablet Commonly known as: REMERON Take 7.5 mg by mouth at bedtime.   OSTEO BI-FLEX REGULAR STRENGTH PO Take 1 tablet by mouth daily.   primidone 50 MG tablet Commonly known as: MYSOLINE Take 50 mg by mouth at bedtime.   rosuvastatin 10 MG tablet Commonly known as: CRESTOR Take 1 tablet by mouth daily.   Synthroid 75 MCG tablet Generic drug: levothyroxine Take 1 tablet by mouth daily.   tiotropium 18 MCG inhalation capsule Commonly known as: Spiriva HandiHaler PLACE 1 CAPSULE INTO INHALER AND INHALE DAILY.   triamcinolone cream 0.1 % Commonly known as: KENALOG Apply 1 Application topically 2 (two) times daily.   verapamil 120 MG CR tablet Commonly known as: CALAN-SR Take 0.5 tablets (60 mg total) by mouth at bedtime. What changed: how much to take   Vitamin D (Ergocalciferol) 1.25 MG (50000 UNIT) Caps capsule Commonly known as: DRISDOL Take 50,000 Units by mouth. Every other week on sunday   zolpidem 10 MG tablet Commonly known as: AMBIEN Take 10 mg by mouth at bedtime as needed for sleep.        No Known Allergies  You were cared for by a hospitalist during your hospital stay. If you have any questions about your discharge medications or the care you received while you were in the hospital after you are discharged, you can call the unit and asked to speak with the hospitalist on call if the hospitalist that took care of you is not available. Once you are discharged, your primary care physician will handle any further medical issues. Please note that no refills for any discharge medications will be authorized once you are discharged, as it is imperative that you return to your primary care physician (or establish a relationship with a primary care physician if you do not have one) for your aftercare  needs so that they can reassess your need for medications and monitor your lab values.  You were cared for by a hospitalist during your hospital stay. If you have any questions about your discharge medications or the care you received while you were in the hospital after you are discharged, you can call the unit and asked to speak with the hospitalist on call if the hospitalist that took care of you is not available. Once you are discharged, your primary care physician will handle any further medical issues. Please note that NO REFILLS for any discharge medications will be authorized once you are discharged, as it is imperative that you return to your primary care physician (or establish a relationship with a primary care physician if you do not have one) for your aftercare needs so that they can reassess your need for medications and monitor your lab values.  Please request your Prim.MD to go over all [facility]  Tests and Procedure/Radiological results at the follow up, please get all Hospital records sent to your Prim MD by signing hospital release before you go home.  Get CBC, CMP, 2 view Chest X ray checked  by Primary MD during your next visit or SNF MD in 5-7 days ( we routinely change or add medications that can affect your baseline labs and fluid status, therefore we recommend that you get the mentioned basic workup next visit with your PCP, your PCP may decide not to get them or add new tests based on their clinical decision)  On your next visit with your primary care physician please Get Medicines reviewed and adjusted.  If you experience worsening of your admission symptoms, develop shortness of breath, life threatening emergency, suicidal or homicidal thoughts you must seek medical attention immediately by calling 911 or calling your MD immediately  if symptoms less severe.  You Must read complete instructions/literature along with all the possible adverse reactions/side effects for all the  Medicines you take and that have been prescribed to you. Take any new Medicines after you have completely understood and accpet all the possible adverse reactions/side effects.   Do not drive, operate heavy machinery, perform activities at heights, swimming or participation in water activities or provide baby sitting services if your were admitted for syncope or siezures until you have seen by Primary MD or a Neurologist and advised to do so again.  Do not drive when taking Pain medications.   Procedures/Studies: DG Abd Portable 1V  Result Date: 10/05/2022 CLINICAL DATA:  Small-bowel obstruction. EXAM: PORTABLE ABDOMEN - 1 VIEW COMPARISON:  KUB 10/04/2022. FINDINGS: Previously seen bowel dilation is improved. Contrast likely in colon. No obvious evidence of free air on this limited supine radiograph. No abnormal calcifications although bowel gas limits assessment. No evidence of acute bony abnormality. IMPRESSION: Previously seen bowel dilation is improved. Contrast likely in colon. Electronically Signed   By: Feliberto Harts M.D.   On: 10/05/2022 16:31   DG Abd Portable 1V-Small Bowel Obstruction Protocol-initial, 8 hr delay  Result Date: 10/04/2022 CLINICAL DATA:  8 hour small-bowel follow-up study EXAM: PORTABLE ABDOMEN - 1 VIEW COMPARISON:  None Available. FINDINGS: Previously administered contrast material lies within the stomach as well as multiple dilated loops of small bowel. No significant colonic contrast is noted. IMPRESSION: Persistent small bowel dilatation containing contrast. No colonic contrast is seen. 24 hour follow-up film is recommended. Electronically Signed   By: Alcide Clever M.D.   On: 10/04/2022 21:56   CT ABDOMEN PELVIS W CONTRAST  Result Date: 10/03/2022 CLINICAL DATA:  Concern for bowel obstruction. EXAM: CT ABDOMEN AND PELVIS WITH CONTRAST TECHNIQUE: Multidetector CT imaging of the abdomen and pelvis was performed using the standard protocol following bolus  administration of intravenous contrast. RADIATION DOSE REDUCTION: This exam was performed according to the departmental dose-optimization program which includes automated exposure control, adjustment of the mA and/or kV according to patient size and/or use of iterative reconstruction technique. CONTRAST:  OMNIPAQUE IOHEXOL 300 MG/ML  SOLN COMPARISON:  CT abdomen pelvis dated 02/01/2006. FINDINGS: Lower chest: The visualized lung bases are clear. There is coronary vascular calcification. No intra-abdominal free air or free fluid. Hepatobiliary: The liver is unremarkable. No related patient. The gallbladder is unremarkable. Pancreas: Unremarkable. No pancreatic ductal dilatation or surrounding inflammatory changes. Spleen: Normal in size without focal abnormality. Adrenals/Urinary Tract: The adrenal glands are unremarkable. Small bilateral renal cysts suboptimally evaluated on this CT. This can be better evaluated with ultrasound  on a nonemergent/outpatient basis. There is no hydronephrosis on either side. There is symmetric enhancement and excretion of contrast by both kidneys. The visualized ureters and urinary bladder appear unremarkable. Stomach/Bowel: Multiple mildly dilated loops of small bowel measure up to 3.7 cm. A transition is noted in the right lower quadrant (56/2 and coronal 52/7) likely representing adhesions. The distal small bowel are collapsed. The appendix is not visualized with certainty. No inflammatory changes identified in the right lower quadrant. Vascular/Lymphatic: Advanced aortoiliac atherosclerotic disease. The IVC is unremarkable. No portal venous gas. There is no adenopathy. Reproductive: Hysterectomy.  No adnexal masses. Other: None Musculoskeletal: Osteopenia with degenerative changes. No acute osseous pathology. IMPRESSION: 1. Small-bowel obstruction with a transition in the right lower quadrant likely secondary to adhesions. 2.  Aortic Atherosclerosis (ICD10-I70.0).  Electronically Signed   By: Elgie Collard M.D.   On: 10/03/2022 20:39     The results of significant diagnostics from this hospitalization (including imaging, microbiology, ancillary and laboratory) are listed below for reference.     Microbiology: No results found for this or any previous visit (from the past 240 hour(s)).   Labs: BNP (last 3 results) No results for input(s): "BNP" in the last 8760 hours. Basic Metabolic Panel: Recent Labs  Lab 10/03/22 1754 10/04/22 0542 10/05/22 0607 10/06/22 0516  NA 134* 134* 137 134*  K 3.7 3.6 3.9 3.3*  CL 101 104 101 100  CO2 22 20* 23 25  GLUCOSE 83 80 95 78  BUN 30* 21 19 14   CREATININE 0.77 0.60 0.83 0.56  CALCIUM 9.0 8.3* 8.7* 8.1*  MG  --   --  1.6*  --   PHOS  --   --  4.2 1.9*   Liver Function Tests: Recent Labs  Lab 10/03/22 1754 10/05/22 0607  AST 17 22  ALT 13 14  ALKPHOS 81 72  BILITOT 1.0 0.6  PROT 6.9 6.1*  ALBUMIN 3.5 3.1*   Recent Labs  Lab 10/03/22 1754  LIPASE 24   No results for input(s): "AMMONIA" in the last 168 hours. CBC: Recent Labs  Lab 10/03/22 1754 10/04/22 0542 10/05/22 0607 10/06/22 0516  WBC 10.2 7.5 12.0* 6.6  HGB 14.1 12.7 15.0 11.7*  HCT 44.5 41.5 47.1* 36.9  MCV 86.4 90.6 88.7 88.5  PLT 272 217 259 202   Cardiac Enzymes: No results for input(s): "CKTOTAL", "CKMB", "CKMBINDEX", "TROPONINI" in the last 168 hours. BNP: Invalid input(s): "POCBNP" CBG: No results for input(s): "GLUCAP" in the last 168 hours. D-Dimer No results for input(s): "DDIMER" in the last 72 hours. Hgb A1c No results for input(s): "HGBA1C" in the last 72 hours. Lipid Profile No results for input(s): "CHOL", "HDL", "LDLCALC", "TRIG", "CHOLHDL", "LDLDIRECT" in the last 72 hours. Thyroid function studies Recent Labs    10/04/22 0542  TSH 3.431   Anemia work up No results for input(s): "VITAMINB12", "FOLATE", "FERRITIN", "TIBC", "IRON", "RETICCTPCT" in the last 72 hours. Urinalysis    Component  Value Date/Time   COLORURINE YELLOW 10/03/2022 1959   APPEARANCEUR CLOUDY (A) 10/03/2022 1959   LABSPEC 1.026 10/03/2022 1959   PHURINE 5.0 10/03/2022 1959   GLUCOSEU NEGATIVE 10/03/2022 1959   HGBUR NEGATIVE 10/03/2022 1959   BILIRUBINUR NEGATIVE 10/03/2022 1959   KETONESUR 20 (A) 10/03/2022 1959   PROTEINUR NEGATIVE 10/03/2022 1959   NITRITE NEGATIVE 10/03/2022 1959   LEUKOCYTESUR NEGATIVE 10/03/2022 1959   Sepsis Labs Recent Labs  Lab 10/03/22 1754 10/04/22 0542 10/05/22 0607 10/06/22 0516  WBC 10.2 7.5 12.0*  6.6   Microbiology No results found for this or any previous visit (from the past 240 hour(s)).   Time coordinating discharge:  I have spent 35 minutes face to face with the patient and on the ward discussing the patients care, assessment, plan and disposition with other care givers. >50% of the time was devoted counseling the patient about the risks and benefits of treatment/Discharge disposition and coordinating care.   SIGNED:   Dimple Nanas, MD  [facility] 10/06/2022, 1:16 PM   If 7PM-7AM, please contact night-coverage

## 2022-10-06 NOTE — Progress Notes (Signed)
Progress Note     Subjective: Having bowel function.  Tolerating soft diet with no issues.  Ambulating.  Objective: Vital signs in last 24 hours: Temp:  [97.5 F (36.4 C)-98 F (36.7 C)] 97.5 F (36.4 C) (08/14 0544) Pulse Rate:  [62-78] 62 (08/14 0544) Resp:  [15-17] 17 (08/14 0544) BP: (144-163)/(54-62) 163/54 (08/14 0544) SpO2:  [95 %-99 %] 96 % (08/14 0544) Last BM Date : 10/05/22  Intake/Output from previous day: 08/13 0701 - 08/14 0700 In: 347 [I.V.:347] Out: -  Intake/Output this shift: No intake/output data recorded.  PE: General: NAD  Lungs: Respiratory effort nonlabored Abd: soft, NT, ND   Lab Results:  Recent Labs    10/05/22 0607 10/06/22 0516  WBC 12.0* 6.6  HGB 15.0 11.7*  HCT 47.1* 36.9  PLT 259 202   BMET Recent Labs    10/05/22 0607 10/06/22 0516  NA 137 134*  K 3.9 3.3*  CL 101 100  CO2 23 25  GLUCOSE 95 78  BUN 19 14  CREATININE 0.83 0.56  CALCIUM 8.7* 8.1*   PT/INR No results for input(s): "LABPROT", "INR" in the last 72 hours. CMP     Component Value Date/Time   NA 134 (L) 10/06/2022 0516   K 3.3 (L) 10/06/2022 0516   CL 100 10/06/2022 0516   CO2 25 10/06/2022 0516   GLUCOSE 78 10/06/2022 0516   BUN 14 10/06/2022 0516   CREATININE 0.56 10/06/2022 0516   CALCIUM 8.1 (L) 10/06/2022 0516   PROT 6.1 (L) 10/05/2022 0607   ALBUMIN 3.1 (L) 10/05/2022 0607   AST 22 10/05/2022 0607   ALT 14 10/05/2022 0607   ALKPHOS 72 10/05/2022 0607   BILITOT 0.6 10/05/2022 0607   GFRNONAA >60 10/06/2022 0516   Lipase     Component Value Date/Time   LIPASE 24 10/03/2022 1754       Studies/Results: DG Abd Portable 1V  Result Date: 10/05/2022 CLINICAL DATA:  Small-bowel obstruction. EXAM: PORTABLE ABDOMEN - 1 VIEW COMPARISON:  KUB 10/04/2022. FINDINGS: Previously seen bowel dilation is improved. Contrast likely in colon. No obvious evidence of free air on this limited supine radiograph. No abnormal calcifications although bowel  gas limits assessment. No evidence of acute bony abnormality. IMPRESSION: Previously seen bowel dilation is improved. Contrast likely in colon. Electronically Signed   By: Feliberto Harts M.D.   On: 10/05/2022 16:31   DG Abd Portable 1V-Small Bowel Obstruction Protocol-initial, 8 hr delay  Result Date: 10/04/2022 CLINICAL DATA:  8 hour small-bowel follow-up study EXAM: PORTABLE ABDOMEN - 1 VIEW COMPARISON:  None Available. FINDINGS: Previously administered contrast material lies within the stomach as well as multiple dilated loops of small bowel. No significant colonic contrast is noted. IMPRESSION: Persistent small bowel dilatation containing contrast. No colonic contrast is seen. 24 hour follow-up film is recommended. Electronically Signed   By: Alcide Clever M.D.   On: 10/04/2022 21:56    Anti-infectives: Anti-infectives (From admission, onward)    None        Assessment/Plan  SBO, likely due to adhesions - CT scan 8/11 shows Small-bowel obstruction with a transition in the right lower quadrant likely secondary to adhesions - WBC 12, VSS, no peritonitis on exam. No indication for acute surgical intervention - 8h delay with contrast in stomach and persistent bowel dilation  - Patient is tolerating soft diet with no issues - surgically stable to DC home.  D/w primary service   ID - none indicated VTE - SCDs, lovenox  FEN - IVF, soft Foley - none   - per [facility] -  COPD HTN HLD Hypothyroidism   LOS: 3 days   I reviewed hospitalist notes, last 24 h vitals and pain scores, last 48 h intake and output, last 24 h labs and trends, and last 24 h imaging results.   Letha Cape, [facility] 10/06/2022, 9:13 AM Please see Amion for pager number during day hours 7:00am-4:30pm

## 2022-10-06 NOTE — Plan of Care (Signed)
  Problem: Education: Goal: Knowledge of General Education information will improve Description Including pain rating scale, medication(s)/side effects and non-pharmacologic comfort measures Outcome: Progressing   

## 2022-10-06 NOTE — Progress Notes (Signed)
Mobility Specialist - Progress Note   10/06/22 0855  Mobility  Activity Ambulated with assistance in hallway  Level of Assistance Modified independent, requires aide device or extra time  Assistive Device None  Distance Ambulated (ft) 500 ft  Range of Motion/Exercises Active  Activity Response Tolerated well  Mobility Referral Yes  $Mobility charge 1 Mobility  Mobility Specialist Start Time (ACUTE ONLY) 0840  Mobility Specialist Stop Time (ACUTE ONLY) 0850  Mobility Specialist Time Calculation (min) (ACUTE ONLY) 10 min   Pt received in bed and agreed to mobility. Had no issues throughout session. Returned to bed with all needs met.  Marilynne Halsted Mobility Specialist

## 2022-10-16 ENCOUNTER — Emergency Department (HOSPITAL_COMMUNITY): Payer: Medicare Other

## 2022-10-16 ENCOUNTER — Inpatient Hospital Stay (HOSPITAL_COMMUNITY)
Admission: EM | Admit: 2022-10-16 | Discharge: 2022-10-22 | DRG: 336 | Disposition: A | Discharge status: Home / Self Care | Payer: Medicare Other | Attending: Student | Admitting: Student

## 2022-10-16 ENCOUNTER — Encounter (HOSPITAL_COMMUNITY): Payer: Self-pay

## 2022-10-16 ENCOUNTER — Other Ambulatory Visit: Payer: Self-pay

## 2022-10-16 DIAGNOSIS — I10 Essential (primary) hypertension: Secondary | ICD-10-CM | POA: Diagnosis not present

## 2022-10-16 DIAGNOSIS — I272 Pulmonary hypertension, unspecified: Secondary | ICD-10-CM | POA: Diagnosis present

## 2022-10-16 DIAGNOSIS — Z841 Family history of disorders of kidney and ureter: Secondary | ICD-10-CM | POA: Diagnosis not present

## 2022-10-16 DIAGNOSIS — Z9841 Cataract extraction status, right eye: Secondary | ICD-10-CM | POA: Diagnosis not present

## 2022-10-16 DIAGNOSIS — Z8249 Family history of ischemic heart disease and other diseases of the circulatory system: Secondary | ICD-10-CM | POA: Diagnosis not present

## 2022-10-16 DIAGNOSIS — K565 Intestinal adhesions [bands], unspecified as to partial versus complete obstruction: Principal | ICD-10-CM | POA: Diagnosis present

## 2022-10-16 DIAGNOSIS — J449 Chronic obstructive pulmonary disease, unspecified: Secondary | ICD-10-CM | POA: Diagnosis not present

## 2022-10-16 DIAGNOSIS — K56609 Unspecified intestinal obstruction, unspecified as to partial versus complete obstruction: Principal | ICD-10-CM | POA: Diagnosis present

## 2022-10-16 DIAGNOSIS — J439 Emphysema, unspecified: Secondary | ICD-10-CM | POA: Diagnosis not present

## 2022-10-16 DIAGNOSIS — K30 Functional dyspepsia: Secondary | ICD-10-CM | POA: Diagnosis not present

## 2022-10-16 DIAGNOSIS — E039 Hypothyroidism, unspecified: Secondary | ICD-10-CM | POA: Diagnosis not present

## 2022-10-16 DIAGNOSIS — J4489 Other specified chronic obstructive pulmonary disease: Secondary | ICD-10-CM | POA: Diagnosis not present

## 2022-10-16 DIAGNOSIS — Z7982 Long term (current) use of aspirin: Secondary | ICD-10-CM | POA: Diagnosis not present

## 2022-10-16 DIAGNOSIS — Z79899 Other long term (current) drug therapy: Secondary | ICD-10-CM | POA: Diagnosis not present

## 2022-10-16 DIAGNOSIS — Z801 Family history of malignant neoplasm of trachea, bronchus and lung: Secondary | ICD-10-CM | POA: Diagnosis not present

## 2022-10-16 DIAGNOSIS — Z4682 Encounter for fitting and adjustment of non-vascular catheter: Secondary | ICD-10-CM | POA: Diagnosis not present

## 2022-10-16 DIAGNOSIS — Z8379 Family history of other diseases of the digestive system: Secondary | ICD-10-CM | POA: Diagnosis not present

## 2022-10-16 DIAGNOSIS — Z5320 Procedure and treatment not carried out because of patient's decision for unspecified reasons: Secondary | ICD-10-CM | POA: Diagnosis present

## 2022-10-16 DIAGNOSIS — G4733 Obstructive sleep apnea (adult) (pediatric): Secondary | ICD-10-CM | POA: Diagnosis present

## 2022-10-16 DIAGNOSIS — Z66 Do not resuscitate: Secondary | ICD-10-CM | POA: Diagnosis not present

## 2022-10-16 DIAGNOSIS — I471 Supraventricular tachycardia, unspecified: Secondary | ICD-10-CM | POA: Diagnosis present

## 2022-10-16 DIAGNOSIS — K449 Diaphragmatic hernia without obstruction or gangrene: Secondary | ICD-10-CM | POA: Diagnosis not present

## 2022-10-16 DIAGNOSIS — Z90710 Acquired absence of both cervix and uterus: Secondary | ICD-10-CM | POA: Diagnosis not present

## 2022-10-16 DIAGNOSIS — Z9842 Cataract extraction status, left eye: Secondary | ICD-10-CM | POA: Diagnosis not present

## 2022-10-16 DIAGNOSIS — K5651 Intestinal adhesions [bands], with partial obstruction: Secondary | ICD-10-CM | POA: Diagnosis not present

## 2022-10-16 DIAGNOSIS — Z9889 Other specified postprocedural states: Secondary | ICD-10-CM | POA: Diagnosis not present

## 2022-10-16 DIAGNOSIS — G250 Essential tremor: Secondary | ICD-10-CM | POA: Diagnosis not present

## 2022-10-16 DIAGNOSIS — Z79890 Hormone replacement therapy (postmenopausal): Secondary | ICD-10-CM | POA: Diagnosis not present

## 2022-10-16 DIAGNOSIS — M8580 Other specified disorders of bone density and structure, unspecified site: Secondary | ICD-10-CM | POA: Diagnosis not present

## 2022-10-16 HISTORY — DX: Other specified postprocedural states: Z98.890

## 2022-10-16 LAB — CBC
HCT: 46 % (ref 36.0–46.0)
Hemoglobin: 14.9 g/dL (ref 12.0–15.0)
MCH: 28.6 pg (ref 26.0–34.0)
MCHC: 32.4 g/dL (ref 30.0–36.0)
MCV: 88.3 fL (ref 80.0–100.0)
Platelets: 372 10*3/uL (ref 150–400)
RBC: 5.21 MIL/uL — ABNORMAL HIGH (ref 3.87–5.11)
RDW: 14.2 % (ref 11.5–15.5)
WBC: 20.2 10*3/uL — ABNORMAL HIGH (ref 4.0–10.5)
nRBC: 0 % (ref 0.0–0.2)

## 2022-10-16 LAB — COMPREHENSIVE METABOLIC PANEL
ALT: 18 U/L (ref 0–44)
AST: 21 U/L (ref 15–41)
Albumin: 4.1 g/dL (ref 3.5–5.0)
Alkaline Phosphatase: 83 U/L (ref 38–126)
Anion gap: 13 (ref 5–15)
BUN: 28 mg/dL — ABNORMAL HIGH (ref 8–23)
CO2: 27 mmol/L (ref 22–32)
Calcium: 9.5 mg/dL (ref 8.9–10.3)
Chloride: 100 mmol/L (ref 98–111)
Creatinine, Ser: 0.96 mg/dL (ref 0.44–1.00)
GFR, Estimated: 60 mL/min — ABNORMAL LOW (ref >60)
Glucose, Bld: 124 mg/dL — ABNORMAL HIGH (ref 70–99)
Potassium: 3.6 mmol/L (ref 3.5–5.1)
Sodium: 140 mmol/L (ref 135–145)
Total Bilirubin: 0.5 mg/dL (ref 0.3–1.2)
Total Protein: 7.8 g/dL (ref 6.5–8.1)

## 2022-10-16 LAB — URINALYSIS, ROUTINE W REFLEX MICROSCOPIC
Bacteria, UA: NONE SEEN
Bilirubin Urine: NEGATIVE
Glucose, UA: NEGATIVE mg/dL
Hgb urine dipstick: NEGATIVE
Ketones, ur: 5 mg/dL — AB
Leukocytes,Ua: NEGATIVE
Nitrite: NEGATIVE
Protein, ur: NEGATIVE mg/dL
Specific Gravity, Urine: 1.018 (ref 1.005–1.030)
pH: 7 (ref 5.0–8.0)

## 2022-10-16 LAB — LIPASE, BLOOD: Lipase: 31 U/L (ref 11–51)

## 2022-10-16 MED ORDER — SENNOSIDES-DOCUSATE SODIUM 8.6-50 MG PO TABS
1.0000 | ORAL_TABLET | Freq: Every evening | ORAL | Status: DC | PRN
  Filled 2022-10-16: qty 1

## 2022-10-16 MED ORDER — MORPHINE SULFATE (PF) 4 MG/ML IV SOLN
4.0000 mg | Freq: Once | INTRAVENOUS | Status: AC
  Administered 2022-10-16: 4 mg via INTRAVENOUS
  Filled 2022-10-16: qty 1

## 2022-10-16 MED ORDER — POTASSIUM CHLORIDE IN NACL 20-0.9 MEQ/L-% IV SOLN
INTRAVENOUS | Status: DC
  Filled 2022-10-16 (×7): qty 1000

## 2022-10-16 MED ORDER — ONDANSETRON HCL 4 MG/2ML IJ SOLN
4.0000 mg | Freq: Four times a day (QID) | INTRAMUSCULAR | Status: DC | PRN
  Administered 2022-10-17 – 2022-10-20 (×5): 4 mg via INTRAVENOUS
  Filled 2022-10-16 (×5): qty 2

## 2022-10-16 MED ORDER — IOHEXOL 300 MG/ML  SOLN
100.0000 mL | Freq: Once | INTRAMUSCULAR | Status: AC | PRN
  Administered 2022-10-16: 100 mL via INTRAVENOUS

## 2022-10-16 MED ORDER — ONDANSETRON HCL 4 MG/2ML IJ SOLN
4.0000 mg | Freq: Once | INTRAMUSCULAR | Status: AC
  Administered 2022-10-16: 4 mg via INTRAVENOUS
  Filled 2022-10-16: qty 2

## 2022-10-16 MED ORDER — ACETAMINOPHEN 325 MG PO TABS: 650.0000 mg | ORAL_TABLET | Freq: Four times a day (QID) | ORAL | Status: DC | PRN

## 2022-10-16 MED ORDER — HEPARIN SODIUM (PORCINE) 5000 UNIT/ML IJ SOLN
5000.0000 [IU] | Freq: Three times a day (TID) | INTRAMUSCULAR | Status: DC
  Administered 2022-10-17 – 2022-10-22 (×14): 5000 [IU] via SUBCUTANEOUS
  Filled 2022-10-16 (×15): qty 1

## 2022-10-16 MED ORDER — LACTATED RINGERS IV BOLUS
1000.0000 mL | Freq: Once | INTRAVENOUS | Status: AC
  Administered 2022-10-16: 1000 mL via INTRAVENOUS

## 2022-10-16 MED ORDER — HYDROMORPHONE HCL 1 MG/ML IJ SOLN
0.5000 mg | INTRAMUSCULAR | Status: DC | PRN
  Administered 2022-10-17 (×4): 0.5 mg via INTRAVENOUS
  Filled 2022-10-16 (×5): qty 0.5

## 2022-10-16 MED ORDER — ACETAMINOPHEN 650 MG RE SUPP: 650.0000 mg | Freq: Four times a day (QID) | RECTAL | Status: DC | PRN

## 2022-10-16 NOTE — ED Notes (Signed)
Attempted IV access without success.

## 2022-10-16 NOTE — ED Provider Notes (Signed)
[place] EMERGENCY DEPARTMENT AT [facility] Provider Note   CSN: [id number] Arrival date & time: 10/16/22  1642     History  Chief Complaint  Patient presents with   Abdominal Pain    Kirsten [name] is a 82 y.o. female.   Abdominal Pain 82 year old female history of asthma, COPD, hypertension presenting for abdominal pain, vomiting.  Patient states she woke up this morning with obstipation and severe abdominal pain and persistent emesis.  She has had multiple this is a normal embolus emesis.  She is not passing gas or having a bowel movement today.  No chest pain or shortness of breath.  No fevers or chills.  Feels similar to prior bowel obstruction, she was admitted in early August for about obstruction which was managed nonoperatively.     Home Medications Prior to Admission medications   Medication Sig Start Date End Date Taking? Authorizing Provider  albuterol (VENTOLIN HFA) 108 (90 Base) MCG/ACT inhaler Inhale 2 puffs into the lungs every 6 (six) hours as needed for wheezing.   Yes [provider]  aspirin 81 MG tablet Take 81 mg by mouth daily.   Yes [provider]  Biotin 5000 MCG TABS Take 5,000 mcg by mouth daily. \   Yes [provider]  CINNAMON PO Take 1,000 mg by mouth daily.   Yes [provider]  estrogens, conjugated, (PREMARIN) 0.9 MG tablet Take 0.9 mg by mouth as directed. Take daily for 21 days then do not take for 7 days.   Yes [provider]  flecainide (TAMBOCOR) 50 MG tablet TAKE 1 TABLET BY MOUTH TWICE A DAY MAY TAKE 1 ADDITIONAL TABLET AS NEEDED FOR FAST HEART BEAT 03/31/22  Yes Marinus Maw, MD  Glucosamine-Chondroitin (OSTEO BI-FLEX REGULAR STRENGTH PO) Take 1 tablet by mouth daily.   Yes [provider]  hydrochlorothiazide (HYDRODIURIL) 25 MG tablet Take 1 tablet by mouth daily.   Yes [provider]  primidone (MYSOLINE) 50 MG tablet Take 50 mg by mouth at bedtime. 04/08/21   Yes [provider]  rosuvastatin (CRESTOR) 10 MG tablet Take 1 tablet by mouth daily. 08/21/17  Yes [provider]  SYNTHROID 75 MCG tablet Take 1 tablet by mouth daily. 08/21/17  Yes [provider]  tiotropium (SPIRIVA HANDIHALER) 18 MCG inhalation capsule PLACE 1 CAPSULE INTO INHALER AND INHALE DAILY. Patient taking differently: 18 mcg daily as needed (Shortness of breath). PLACE 1 CAPSULE INTO INHALER AND INHALE DAILY. 12/26/17  Yes [name], MD  verapamil (CALAN-SR) 120 MG CR tablet Take 0.5 tablets (60 mg total) by mouth at bedtime. Patient taking differently: Take 120 mg by mouth at bedtime. 09/29/21  Yes Marinus Maw, MD  Vitamin D, Ergocalciferol, (DRISDOL) 50000 UNITS CAPS Take 50,000 Units by mouth. Every other week on sunday   Yes [provider]  zolpidem (AMBIEN) 10 MG tablet Take 10 mg by mouth at bedtime as needed for sleep.    Yes [provider]  mirtazapine (REMERON) 15 MG tablet Take 7.5 mg by mouth at bedtime. Patient not taking: Reported on 10/16/2022 12/30/21   [provider]  triamcinolone cream (KENALOG) 0.1 % Apply 1 Application topically 2 (two) times daily. Patient not taking: Reported on 10/03/2022 08/24/22   [provider]      Allergies    Patient has no known allergies.    Review of Systems   Review of Systems  Gastrointestinal:  Positive for abdominal pain.  Review of systems completed and notable as per HPI.  ROS otherwise negative.   Physical Exam Updated Vital Signs BP (!) 176/71 (BP Location: Right Arm)   Pulse 87   Temp 98.4 F (36.9 C) (Oral)   Resp 19   SpO2 91%  Physical Exam Vitals and nursing note reviewed.  Constitutional:      General: She is not in acute distress.    Appearance: She is well-developed.  HENT:     Head: Normocephalic and atraumatic.  Eyes:     Conjunctiva/sclera: Conjunctivae normal.  Cardiovascular:     Rate and Rhythm: Normal rate and regular rhythm.      Heart sounds: No murmur heard. Pulmonary:     Effort: Pulmonary effort is normal. No respiratory distress.     Breath sounds: Normal breath sounds.  Abdominal:     General: There is distension.     Palpations: Abdomen is soft.     Tenderness: There is generalized abdominal tenderness. There is no guarding or rebound.  Musculoskeletal:        General: No swelling.     Cervical back: Neck supple.  Skin:    General: Skin is warm and dry.     Capillary Refill: Capillary refill takes less than 2 seconds.  Neurological:     Mental Status: She is alert.  Psychiatric:        Mood and Affect: Mood normal.     ED Results / Procedures / Treatments   Labs (all labs ordered are listed, but only abnormal results are displayed) Labs Reviewed  COMPREHENSIVE METABOLIC PANEL - Abnormal; Notable for the following components:      Result Value   Glucose, Bld 124 (*)    BUN 28 (*)    GFR, Estimated 60 (*)    All other components within normal limits  CBC - Abnormal; Notable for the following components:   WBC 20.2 (*)    RBC 5.21 (*)    All other components within normal limits  URINALYSIS, ROUTINE W REFLEX MICROSCOPIC - Abnormal; Notable for the following components:   Ketones, ur 5 (*)    All other components within normal limits  LIPASE, BLOOD    EKG None  Radiology CT ABDOMEN PELVIS W CONTRAST  Result Date: 10/16/2022 CLINICAL DATA:  Generalized abdominal pain and vomiting. Recent small bowel obstruction. EXAM: CT ABDOMEN AND PELVIS WITH CONTRAST TECHNIQUE: Multidetector CT imaging of the abdomen and pelvis was performed using the standard protocol following bolus administration of intravenous contrast. RADIATION DOSE REDUCTION: This exam was performed according to the departmental dose-optimization program which includes automated exposure control, adjustment of the mA and/or kV according to patient size and/or use of iterative reconstruction technique. CONTRAST:  OMNIPAQUE  IOHEXOL 300 MG/ML  SOLN COMPARISON:  CT 10/03/2022.  X-ray 10/05/2022 and older FINDINGS: Lower chest: Linear opacity along the lung bases likely scar or atelectasis. No pleural effusion. Small hiatal hernia. Coronary artery calcifications are seen. Hepatobiliary: Fatty liver infiltration patent portal vein. Gallbladder is present. No space-occupying liver lesion. Pancreas: Unremarkable. No pancreatic ductal dilatation or surrounding inflammatory changes. Spleen: Spleen has a small cystic area inferiorly, too small to completely characterize but likely a benign lesion. No specific imaging follow-up. Spleen is nonenlarged. Adrenals/Urinary Tract: Adrenal glands are preserved. Mild bilateral renal atrophy. There are some benign Bosniak 1 and 2 renal cystic foci identified. No specific imaging follow-up. Ureters have normal course and caliber. Preserved contours of the urinary bladder. Stomach/Bowel: There is some  debris in the stomach. Stomach is nondilated. There is a second portion duodenal diverticulum. Large bowel is of normal course and caliber with scattered stool. Sigmoid colon diverticula. There are some dilated loops of fluid-filled small bowel in the mid to upper pelvis. Few air-fluid levels. These measure up to 3.5 cm in diameter. There are some small bowel stool appearance as well along loops in the right hemipelvis with an abrupt transition seen in the anterolateral right hemipelvis. Transition seen for example sagittal series 10, image 76, axial series 3 image 61 and 60 and coronal series 8, image 59. This in a similar location on the prior examination. No pneumatosis or portal venous gas. Vascular/Lymphatic: Aortic atherosclerosis. No enlarged abdominal or pelvic lymph nodes. Reproductive: Status post hysterectomy. No adnexal masses. Other: Mild stranding and mesenteric fluid.  No free air Musculoskeletal: Curvature of the lumbar spine. Scattered degenerative changes of the spine. Scattered degenerative  changes of the pelvis. IMPRESSION: Similar to previous thirds mild-to-moderately dilated loops of small bowel with air-fluid levels, small bowel stool appearance and transition in the right lateral hemipelvis anteriorly. No pneumatosis or free air. Scattered free fluid. Fatty liver infiltration Electronically Signed   By: Karen Kays M.D.   On: 10/16/2022 21:13    Procedures Procedures    Medications Ordered in ED Medications  lactated ringers bolus 1,000 mL (1,000 mLs Intravenous New Bag/Given 10/16/22 2005)  morphine (PF) 4 MG/ML injection 4 mg (4 mg Intravenous Given 10/16/22 2005)  ondansetron (ZOFRAN) injection 4 mg (4 mg Intravenous Given 10/16/22 2006)  iohexol (OMNIPAQUE) 300 MG/ML solution 100 mL (100 mLs Intravenous Contrast Given 10/16/22 2045)  morphine (PF) 4 MG/ML injection 4 mg (4 mg Intravenous Given 10/16/22 2239)    ED Course/ Medical Decision Making/ A&P                                 Medical Decision Making Amount and/or Complexity of Data Reviewed Labs: ordered. Radiology: ordered.  Risk Prescription drug management. Decision regarding hospitalization.   Medical Decision Making:   Kirsten [name] is a 82 y.o. female who presented to the ED today with abdominal pain, vomiting.  Vital signs reviewed.  Exam she has abdominal extension and tenderness and vomiting with obstipation concerning for obstruction.  Plan for CT scan, lab workup, symptomatic management.   Patient placed on continuous vitals and telemetry monitoring while in ED which was reviewed periodically.  Reviewed and confirmed nursing documentation for past medical history, family history, social history.  Reassessment and Plan:   Labwork reviewed, significantly cytosis.  Urinalysis without infection.  CMP, lipase unremarkable.  Abdominal CT is similar to prior concerning for obstruction with transition in the right lower quadrant.  Discussed with Dr. Maisie Fus with general surgery who recommends NG tube,  medicine admission.  Discussed with medicine team and admitted for further management.  NG tube is ordered and pending.   Patient's presentation is most consistent with acute complicated illness / injury requiring diagnostic workup.           Final Clinical Impression(s) / ED Diagnoses Final diagnoses:  SBO (small bowel obstruction) (HCC)    Rx / DC Orders ED Discharge Orders     None         Laurence Spates, MD 10/16/22 [phone]

## 2022-10-16 NOTE — H&P (Incomplete)
PCP:   [name]., MD   Chief Complaint:  Abdominal pain  HPI: This is a 82 year old female with past medical history of COPD, HTN, hypothyroidism.  Patient underwent sacrocolpopexy 11/2021.  In the postoperative period, patient developed SBO.  8/11 - 8/14 patient admitted here with SBO.  She was treated conservatively, discharged.  Yesterday morning patient developed severe cramping and abdominal pain.  She had associated nausea and vomiting.  As this was her third episode, she recognized what was going on, she came to the ER.  In the ER intake BP 153/111, other vitals stable.  WBC 20.2.  CT abdomen pelvis consistent with SBO.  Patient additionally very nauseous and vomiting Surgeon on-call Dr. Maisie Fus contacted by EDP.  Admission and NG tube recommended.   Review of Systems:  Per HPI  Past Medical History: Past Medical History:  Diagnosis Date   Allergy    occasional seasonal   Asthma    COPD (chronic obstructive pulmonary disease) (HCC)    Emphysema of lung (HCC)    H/O: hysterectomy    Heart murmur    HTN (hypertension)    HTN (hypertension)    IGT (impaired glucose tolerance)    Irregular heart rate    take Flecinade   OSA (obstructive sleep apnea) 12/08/2017   Osteopenia    Palpitations    Thyroid disease    hypothyroid   Tinnitus    Tremor, essential    Vertigo    Past Surgical History:  Procedure Laterality Date   CATARACT EXTRACTION     both eyes- February 2015   orthopedic procedure     both feet   TRIGGER FINGER RELEASE     right hand    Medications: Prior to Admission medications   Medication Sig Start Date End Date Taking? Authorizing Provider  albuterol (VENTOLIN HFA) 108 (90 Base) MCG/ACT inhaler Inhale 2 puffs into the lungs every 6 (six) hours as needed for wheezing.   Yes [provider]  aspirin 81 MG tablet Take 81 mg by mouth daily.   Yes [provider]  Biotin 5000 MCG TABS Take 5,000 mcg by mouth daily. \   Yes  [provider]  CINNAMON PO Take 1,000 mg by mouth daily.   Yes [provider]  estrogens, conjugated, (PREMARIN) 0.9 MG tablet Take 0.9 mg by mouth as directed. Take daily for 21 days then do not take for 7 days.   Yes [provider]  flecainide (TAMBOCOR) 50 MG tablet TAKE 1 TABLET BY MOUTH TWICE A DAY MAY TAKE 1 ADDITIONAL TABLET AS NEEDED FOR FAST HEART BEAT 03/31/22  Yes Marinus Maw, MD  Glucosamine-Chondroitin (OSTEO BI-FLEX REGULAR STRENGTH PO) Take 1 tablet by mouth daily.   Yes [provider]  hydrochlorothiazide (HYDRODIURIL) 25 MG tablet Take 1 tablet by mouth daily.   Yes [provider]  primidone (MYSOLINE) 50 MG tablet Take 50 mg by mouth at bedtime. 04/08/21  Yes [provider]  rosuvastatin (CRESTOR) 10 MG tablet Take 1 tablet by mouth daily. 08/21/17  Yes [provider]  SYNTHROID 75 MCG tablet Take 1 tablet by mouth daily. 08/21/17  Yes [provider]  tiotropium (SPIRIVA HANDIHALER) 18 MCG inhalation capsule PLACE 1 CAPSULE INTO INHALER AND INHALE DAILY. Patient taking differently: 18 mcg daily as needed (Shortness of breath). PLACE 1 CAPSULE INTO INHALER AND INHALE DAILY. 12/26/17  Yes [name], MD  verapamil (CALAN-SR) 120 MG CR tablet Take 0.5 tablets (60  mg total) by mouth at bedtime. Patient taking differently: Take 120 mg by mouth at bedtime. 09/29/21  Yes Marinus Maw, MD  Vitamin D, Ergocalciferol, (DRISDOL) 50000 UNITS CAPS Take 50,000 Units by mouth. Every other week on sunday   Yes [provider]  zolpidem (AMBIEN) 10 MG tablet Take 10 mg by mouth at bedtime as needed for sleep.    Yes [provider]  mirtazapine (REMERON) 15 MG tablet Take 7.5 mg by mouth at bedtime. Patient not taking: Reported on 10/16/2022 12/30/21   [provider]  triamcinolone cream (KENALOG) 0.1 % Apply 1 Application topically 2 (two) times daily. Patient not taking: Reported on  10/03/2022 08/24/22   [provider]     Allergies:  No Known Allergies  Social History:  reports that she has never smoked. She has never used smokeless tobacco. She reports that she does not drink alcohol and does not use drugs.  Family History: Family History  Problem Relation Age of Onset   Kidney disease Mother    Cirrhosis Mother    Lung cancer Father    Heart disease Maternal Grandmother    Colon cancer Neg Hx    Esophageal cancer Neg Hx    Rectal cancer Neg Hx    Stomach cancer Neg Hx    Pancreatic cancer Neg Hx     Physical Exam: Vitals:   10/16/22 1712 10/16/22 2044 10/16/22 2127  BP: (!) 153/111 (!) 176/71   Pulse: 75 87   Resp: 19 19   Temp: 97.7 F (36.5 C)  98.4 F (36.9 C)  TempSrc: Oral  Oral  SpO2: 95% 91%     General: A and O x 3, well developed and nourished, no acute distress Eyes: Pink conjunctiva, no scleral icterus ENT: Dry lips, neck supple, no thyromegaly Lungs: CTA /L, no wheeze, no crackles, no use of accessory muscles Cardiovascular: RRR, no regurgitation, no gallops, no murmurs. No JVD Abdomen: soft, no bowel sounds.  Generalized TTP, mild, not an acute abdomen GU: not examined Neuro: CN II - XII grossly intact, sensation intact Musculoskeletal: strength 5/5 all extremities, no clubbing, cyanosis or edema Skin: no rash, no subcutaneous crepitation, no decubitus Psych: appropriate patient  Labs on Admission:  Recent Labs    10/16/22 1950  NA 140  K 3.6  CL 100  CO2 27  GLUCOSE 124*  BUN 28*  CREATININE 0.96  CALCIUM 9.5   Recent Labs    10/16/22 1950  AST 21  ALT 18  ALKPHOS 83  BILITOT 0.5  PROT 7.8  ALBUMIN 4.1   Recent Labs    10/16/22 1950  LIPASE 31   Recent Labs    10/16/22 1950  WBC 20.2*  HGB 14.9  HCT 46.0  MCV 88.3  PLT 372     Radiological Exams on Admission: CT ABDOMEN PELVIS W CONTRAST  Result Date: 10/16/2022 CLINICAL DATA:  Generalized abdominal pain and vomiting. Recent small  bowel obstruction. EXAM: CT ABDOMEN AND PELVIS WITH CONTRAST TECHNIQUE: Multidetector CT imaging of the abdomen and pelvis was performed using the standard protocol following bolus administration of intravenous contrast. RADIATION DOSE REDUCTION: This exam was performed according to the departmental dose-optimization program which includes automated exposure control, adjustment of the mA and/or kV according to patient size and/or use of iterative reconstruction technique. CONTRAST:  OMNIPAQUE IOHEXOL 300 MG/ML  SOLN COMPARISON:  CT 10/03/2022.  X-ray 10/05/2022 and older FINDINGS: Lower chest: Linear opacity along the lung bases likely scar  or atelectasis. No pleural effusion. Small hiatal hernia. Coronary artery calcifications are seen. Hepatobiliary: Fatty liver infiltration patent portal vein. Gallbladder is present. No space-occupying liver lesion. Pancreas: Unremarkable. No pancreatic ductal dilatation or surrounding inflammatory changes. Spleen: Spleen has a small cystic area inferiorly, too small to completely characterize but likely a benign lesion. No specific imaging follow-up. Spleen is nonenlarged. Adrenals/Urinary Tract: Adrenal glands are preserved. Mild bilateral renal atrophy. There are some benign Bosniak 1 and 2 renal cystic foci identified. No specific imaging follow-up. Ureters have normal course and caliber. Preserved contours of the urinary bladder. Stomach/Bowel: There is some debris in the stomach. Stomach is nondilated. There is a second portion duodenal diverticulum. Large bowel is of normal course and caliber with scattered stool. Sigmoid colon diverticula. There are some dilated loops of fluid-filled small bowel in the mid to upper pelvis. Few air-fluid levels. These measure up to 3.5 cm in diameter. There are some small bowel stool appearance as well along loops in the right hemipelvis with an abrupt transition seen in the anterolateral right hemipelvis. Transition seen for example  sagittal series 10, image 76, axial series 3 image 61 and 60 and coronal series 8, image 59. This in a similar location on the prior examination. No pneumatosis or portal venous gas. Vascular/Lymphatic: Aortic atherosclerosis. No enlarged abdominal or pelvic lymph nodes. Reproductive: Status post hysterectomy. No adnexal masses. Other: Mild stranding and mesenteric fluid.  No free air Musculoskeletal: Curvature of the lumbar spine. Scattered degenerative changes of the spine. Scattered degenerative changes of the pelvis. IMPRESSION: Similar to previous thirds mild-to-moderately dilated loops of small bowel with air-fluid levels, small bowel stool appearance and transition in the right lateral hemipelvis anteriorly. No pneumatosis or free air. Scattered free fluid. Fatty liver infiltration Electronically Signed   By: Karen Kays M.D.   On: 10/16/2022 21:13    Assessment/Plan Present on Admission:  SBO (small bowel obstruction) (HCC) -N.p.o., IV fluid hydration -NG tube to low intermittent wall suction -Antiemetics and pain meds as needed -Surgeon Dr. Maisie Fus consulted, surgery will follow.   COPD with asthma -Continue Spiriva.  Nebulizers PRN.   Hypertension -Oral meds on hold -As needed blood pressure medication   Palpitations // history of SVT -Flecainide, verapamil and aspirin held. -Will monitor on telemetry   Hypothyroidism -Synthroid held   Essential tremor -Mysoline held  Jadine Brumley 10/16/2022, 11:38 PM

## 2022-10-16 NOTE — ED Triage Notes (Signed)
Pt arrived via POV, c/o generalized abd pain and vomiting. States recently admitted for SBO, feels the same.

## 2022-10-17 ENCOUNTER — Inpatient Hospital Stay (HOSPITAL_COMMUNITY): Payer: Medicare Other

## 2022-10-17 ENCOUNTER — Observation Stay (HOSPITAL_COMMUNITY): Payer: Medicare Other

## 2022-10-17 DIAGNOSIS — Z79899 Other long term (current) drug therapy: Secondary | ICD-10-CM | POA: Diagnosis not present

## 2022-10-17 DIAGNOSIS — Z90710 Acquired absence of both cervix and uterus: Secondary | ICD-10-CM | POA: Diagnosis not present

## 2022-10-17 DIAGNOSIS — Z5320 Procedure and treatment not carried out because of patient's decision for unspecified reasons: Secondary | ICD-10-CM | POA: Diagnosis present

## 2022-10-17 DIAGNOSIS — J449 Chronic obstructive pulmonary disease, unspecified: Secondary | ICD-10-CM | POA: Diagnosis not present

## 2022-10-17 DIAGNOSIS — K5651 Intestinal adhesions [bands], with partial obstruction: Secondary | ICD-10-CM | POA: Diagnosis not present

## 2022-10-17 DIAGNOSIS — J439 Emphysema, unspecified: Secondary | ICD-10-CM | POA: Diagnosis present

## 2022-10-17 DIAGNOSIS — Z9889 Other specified postprocedural states: Secondary | ICD-10-CM | POA: Diagnosis not present

## 2022-10-17 DIAGNOSIS — K30 Functional dyspepsia: Secondary | ICD-10-CM | POA: Diagnosis not present

## 2022-10-17 DIAGNOSIS — Z66 Do not resuscitate: Secondary | ICD-10-CM | POA: Diagnosis not present

## 2022-10-17 DIAGNOSIS — Z9842 Cataract extraction status, left eye: Secondary | ICD-10-CM | POA: Diagnosis not present

## 2022-10-17 DIAGNOSIS — Z801 Family history of malignant neoplasm of trachea, bronchus and lung: Secondary | ICD-10-CM | POA: Diagnosis not present

## 2022-10-17 DIAGNOSIS — Z79890 Hormone replacement therapy (postmenopausal): Secondary | ICD-10-CM | POA: Diagnosis not present

## 2022-10-17 DIAGNOSIS — I10 Essential (primary) hypertension: Secondary | ICD-10-CM | POA: Diagnosis present

## 2022-10-17 DIAGNOSIS — Z841 Family history of disorders of kidney and ureter: Secondary | ICD-10-CM | POA: Diagnosis not present

## 2022-10-17 DIAGNOSIS — Z9841 Cataract extraction status, right eye: Secondary | ICD-10-CM | POA: Diagnosis not present

## 2022-10-17 DIAGNOSIS — I471 Supraventricular tachycardia, unspecified: Secondary | ICD-10-CM | POA: Diagnosis present

## 2022-10-17 DIAGNOSIS — E039 Hypothyroidism, unspecified: Secondary | ICD-10-CM | POA: Diagnosis present

## 2022-10-17 DIAGNOSIS — K56609 Unspecified intestinal obstruction, unspecified as to partial versus complete obstruction: Secondary | ICD-10-CM

## 2022-10-17 DIAGNOSIS — Z7982 Long term (current) use of aspirin: Secondary | ICD-10-CM | POA: Diagnosis not present

## 2022-10-17 DIAGNOSIS — Z4682 Encounter for fitting and adjustment of non-vascular catheter: Secondary | ICD-10-CM | POA: Diagnosis not present

## 2022-10-17 DIAGNOSIS — G250 Essential tremor: Secondary | ICD-10-CM | POA: Diagnosis present

## 2022-10-17 DIAGNOSIS — K565 Intestinal adhesions [bands], unspecified as to partial versus complete obstruction: Secondary | ICD-10-CM | POA: Diagnosis present

## 2022-10-17 DIAGNOSIS — M8580 Other specified disorders of bone density and structure, unspecified site: Secondary | ICD-10-CM | POA: Diagnosis present

## 2022-10-17 DIAGNOSIS — J4489 Other specified chronic obstructive pulmonary disease: Secondary | ICD-10-CM | POA: Diagnosis present

## 2022-10-17 DIAGNOSIS — G4733 Obstructive sleep apnea (adult) (pediatric): Secondary | ICD-10-CM | POA: Diagnosis present

## 2022-10-17 DIAGNOSIS — Z8379 Family history of other diseases of the digestive system: Secondary | ICD-10-CM | POA: Diagnosis not present

## 2022-10-17 DIAGNOSIS — Z8249 Family history of ischemic heart disease and other diseases of the circulatory system: Secondary | ICD-10-CM | POA: Diagnosis not present

## 2022-10-17 LAB — CBC WITH DIFFERENTIAL/PLATELET
Abs Immature Granulocytes: 0.04 10*3/uL (ref 0.00–0.07)
Basophils Absolute: 0 10*3/uL (ref 0.0–0.1)
Basophils Relative: 0 %
Eosinophils Absolute: 0 10*3/uL (ref 0.0–0.5)
Eosinophils Relative: 0 %
HCT: 44.6 % (ref 36.0–46.0)
Hemoglobin: 13.8 g/dL (ref 12.0–15.0)
Immature Granulocytes: 0 %
Lymphocytes Relative: 12 %
Lymphs Abs: 1.5 10*3/uL (ref 0.7–4.0)
MCH: 27.5 pg (ref 26.0–34.0)
MCHC: 30.9 g/dL (ref 30.0–36.0)
MCV: 89 fL (ref 80.0–100.0)
Monocytes Absolute: 0.5 10*3/uL (ref 0.1–1.0)
Monocytes Relative: 4 %
Neutro Abs: 10.3 10*3/uL — ABNORMAL HIGH (ref 1.7–7.7)
Neutrophils Relative %: 84 %
Platelets: 330 10*3/uL (ref 150–400)
RBC: 5.01 MIL/uL (ref 3.87–5.11)
RDW: 14.6 % (ref 11.5–15.5)
WBC: 12.4 10*3/uL — ABNORMAL HIGH (ref 4.0–10.5)
nRBC: 0 % (ref 0.0–0.2)

## 2022-10-17 LAB — MAGNESIUM: Magnesium: 1.9 mg/dL (ref 1.7–2.4)

## 2022-10-17 LAB — BASIC METABOLIC PANEL
Anion gap: 9 (ref 5–15)
BUN: 22 mg/dL (ref 8–23)
CO2: 27 mmol/L (ref 22–32)
Calcium: 8.6 mg/dL — ABNORMAL LOW (ref 8.9–10.3)
Chloride: 103 mmol/L (ref 98–111)
Creatinine, Ser: 0.82 mg/dL (ref 0.44–1.00)
GFR, Estimated: >60 mL/min (ref >60)
Glucose, Bld: 117 mg/dL — ABNORMAL HIGH (ref 70–99)
Potassium: 3.8 mmol/L (ref 3.5–5.1)
Sodium: 139 mmol/L (ref 135–145)

## 2022-10-17 MED ORDER — MAGNESIUM SULFATE IN D5W 1-5 GM/100ML-% IV SOLN
1.0000 g | Freq: Once | INTRAVENOUS | Status: AC
  Administered 2022-10-17: 1 g via INTRAVENOUS
  Filled 2022-10-17 (×2): qty 100

## 2022-10-17 MED ORDER — METOPROLOL TARTRATE 5 MG/5ML IV SOLN: 5.0000 mg | INTRAVENOUS | Status: DC | PRN

## 2022-10-17 MED ORDER — KETOROLAC TROMETHAMINE 15 MG/ML IJ SOLN
15.0000 mg | Freq: Four times a day (QID) | INTRAMUSCULAR | Status: DC | PRN
  Administered 2022-10-17 – 2022-10-18 (×2): 15 mg via INTRAVENOUS
  Filled 2022-10-17 (×2): qty 1

## 2022-10-17 MED ORDER — MORPHINE SULFATE (PF) 4 MG/ML IV SOLN
4.0000 mg | INTRAVENOUS | Status: DC | PRN
  Administered 2022-10-17 – 2022-10-20 (×9): 4 mg via INTRAVENOUS
  Filled 2022-10-17 (×9): qty 1

## 2022-10-17 MED ORDER — LEVOTHYROXINE SODIUM 100 MCG/5ML IV SOLN: 50.0000 ug | Freq: Every day | INTRAVENOUS | Status: DC

## 2022-10-17 MED ORDER — HYDRALAZINE HCL 10 MG PO TABS: 10.0000 mg | ORAL_TABLET | Freq: Four times a day (QID) | ORAL | Status: DC | PRN

## 2022-10-17 MED ORDER — HYDRALAZINE HCL 20 MG/ML IJ SOLN: 10.0000 mg | Freq: Four times a day (QID) | INTRAMUSCULAR | Status: DC | PRN

## 2022-10-17 MED ORDER — DIATRIZOATE MEGLUMINE & SODIUM 66-10 % PO SOLN
90.0000 mL | Freq: Once | ORAL | Status: AC
  Administered 2022-10-17: 90 mL via NASOGASTRIC
  Filled 2022-10-17 (×2): qty 90

## 2022-10-17 NOTE — Progress Notes (Signed)
[facility] PROGRESS NOTE    Progress Note  Kirsten [name]  JWJ:[id number] DOB: 1940-10-08 DOA: 10/16/2022 PCP: [name]., MD     Brief Narrative:   Kirsten [name] is an 82 y.o. female past medical history significant for COPD, essential hypertension who underwent sacral plexi on October 2023, postoperatively she developed SBO and discharged on 10/06/2022 treated conservatively.  In the morning of admission started developing again abdominal pain came into the ER CT scan showed SBO.   Assessment/Plan:   SBO (small bowel obstruction) (HCC) Currently on n.p.o. IV fluids and NG tube with intermittent suction. General surgery has been consulted. Try to keep potassium greater than 4 magnesium greater than 2 out of bed to chair consult physical therapy. Start small bowel protocol. She relates her abdominal pain is better, but she is really thirsty.  Essential hypertension: Blood pressure stable continue to hold all oral meds.  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  History of SVT: Continue flecainide verapamil and aspirin. Continue to monitor.  Hypothyroidism: Hold Synthroid will consult pharmacy to start IV Synthroid.  Essential tremor: Hold oral medication.   DVT prophylaxis: lovenox Family Communication:none Status is: Observation The patient will require care spanning > 2 midnights and should be moved to inpatient because: SBO    Code Status:     Code Status Orders  (From admission, onward)           Start     Ordered   10/16/22 2335  Do not attempt resuscitation (DNR)  Continuous       Question Answer Comment  If patient has no pulse and is not breathing Do Not Attempt Resuscitation   If patient has a pulse and/or is breathing: Medical Treatment Goals COMFORT MEASURES: Keep clean/warm/dry, use medication by any route; positioning, wound care and other measures to relieve pain/suffering; use oxygen, suction/manual treatment of airway  obstruction for comfort; do not transfer unless for comfort needs.   Consent: Discussion documented in EHR or advanced directives reviewed      10/16/22 2335           Code Status History     Date Active Date Inactive Code Status Order ID Comments User Context   10/16/2022 2335 10/16/2022 2335 Full Code [id number]  Gery Pray, MD ED   10/03/2022 2220 10/06/2022 1628 DNR [id number]  Reva Bores, MD ED      Advance Directive Documentation    Flowsheet Row Most Recent Value  Type of Advance Directive Healthcare Power of Attorney, Living will  Pre-existing out of facility DNR order (yellow form or pink MOST form) --  "MOST" Form in Place? --         IV Access:   Peripheral IV   Procedures and diagnostic studies:   DG Abd 1 View  Result Date: 10/17/2022 CLINICAL DATA:  Nasogastric tube placement EXAM: ABDOMEN - 1 VIEW COMPARISON:  Yesterday FINDINGS: Enteric tube with tip and side port at the stomach. Excreting renal contrast from recent CT. Known dilated small bowel in the central abdomen. Symmetric aeration at the lung bases. IMPRESSION: Enteric tube with tip and side port at the stomach. Electronically Signed   By: Tiburcio Pea M.D.   On: 10/17/2022 04:09   CT ABDOMEN PELVIS W CONTRAST  Result Date: 10/16/2022 CLINICAL DATA:  Generalized abdominal pain and vomiting. Recent small bowel obstruction. EXAM: CT ABDOMEN AND PELVIS WITH CONTRAST TECHNIQUE: Multidetector CT imaging of the abdomen and pelvis was performed using  the standard protocol following bolus administration of intravenous contrast. RADIATION DOSE REDUCTION: This exam was performed according to the departmental dose-optimization program which includes automated exposure control, adjustment of the mA and/or kV according to patient size and/or use of iterative reconstruction technique. CONTRAST:  OMNIPAQUE IOHEXOL 300 MG/ML  SOLN COMPARISON:  CT 10/03/2022.  X-ray 10/05/2022 and older FINDINGS: Lower  chest: Linear opacity along the lung bases likely scar or atelectasis. No pleural effusion. Small hiatal hernia. Coronary artery calcifications are seen. Hepatobiliary: Fatty liver infiltration patent portal vein. Gallbladder is present. No space-occupying liver lesion. Pancreas: Unremarkable. No pancreatic ductal dilatation or surrounding inflammatory changes. Spleen: Spleen has a small cystic area inferiorly, too small to completely characterize but likely a benign lesion. No specific imaging follow-up. Spleen is nonenlarged. Adrenals/Urinary Tract: Adrenal glands are preserved. Mild bilateral renal atrophy. There are some benign Bosniak 1 and 2 renal cystic foci identified. No specific imaging follow-up. Ureters have normal course and caliber. Preserved contours of the urinary bladder. Stomach/Bowel: There is some debris in the stomach. Stomach is nondilated. There is a second portion duodenal diverticulum. Large bowel is of normal course and caliber with scattered stool. Sigmoid colon diverticula. There are some dilated loops of fluid-filled small bowel in the mid to upper pelvis. Few air-fluid levels. These measure up to 3.5 cm in diameter. There are some small bowel stool appearance as well along loops in the right hemipelvis with an abrupt transition seen in the anterolateral right hemipelvis. Transition seen for example sagittal series 10, image 76, axial series 3 image 61 and 60 and coronal series 8, image 59. This in a similar location on the prior examination. No pneumatosis or portal venous gas. Vascular/Lymphatic: Aortic atherosclerosis. No enlarged abdominal or pelvic lymph nodes. Reproductive: Status post hysterectomy. No adnexal masses. Other: Mild stranding and mesenteric fluid.  No free air Musculoskeletal: Curvature of the lumbar spine. Scattered degenerative changes of the spine. Scattered degenerative changes of the pelvis. IMPRESSION: Similar to previous thirds mild-to-moderately dilated loops  of small bowel with air-fluid levels, small bowel stool appearance and transition in the right lateral hemipelvis anteriorly. No pneumatosis or free air. Scattered free fluid. Fatty liver infiltration Electronically Signed   By: Karen Kays M.D.   On: 10/16/2022 21:13     Medical Consultants:   None.   Subjective:    Sharleen Lule Fann   Objective:    Vitals:   10/17/22 0208 10/17/22 0212 10/17/22 0213 10/17/22 0613  BP: (!) 159/78   (!) 170/60  Pulse: 73   72  Resp: 18   20  Temp: 97.7 F (36.5 C)   98 F (36.7 C)  TempSrc: Oral   Oral  SpO2: 95%   95%  Weight:   49.6 kg   Height:  4\' 10"  (1.473 m)     SpO2: 95 %   Intake/Output Summary (Last 24 hours) at 10/17/2022 0725 Last data filed at 10/17/2022 0439 Gross per 24 hour  Intake 156.18 ml  Output --  Net 156.18 ml   Filed Weights   10/17/22 0213  Weight: 49.6 kg    Exam: General exam: In no acute distress. Respiratory system: Good air movement and clear to auscultation. Cardiovascular system: S1 & S2 heard, RRR. No JVD.  Gastrointestinal system: Abdomen is nondistended, soft and nontender.  Extremities: No pedal edema. Skin: No rashes, lesions or ulcers Psychiatry: Judgement and insight appear normal. Mood & affect appropriate.    Data Reviewed:    Labs:  Basic Metabolic Panel: Recent Labs  Lab 10/16/22 1950 10/17/22 0338  NA 140 139  K 3.6 3.8  CL 100 103  CO2 27 27  GLUCOSE 124* 117*  BUN 28* 22  CREATININE 0.96 0.82  CALCIUM 9.5 8.6*  MG  --  1.9   GFR Estimated Creatinine Clearance: 38.4 mL/min (by C-G formula based on SCr of 0.82 mg/dL). Liver Function Tests: Recent Labs  Lab 10/16/22 1950  AST 21  ALT 18  ALKPHOS 83  BILITOT 0.5  PROT 7.8  ALBUMIN 4.1   Recent Labs  Lab 10/16/22 1950  LIPASE 31   No results for input(s): "AMMONIA" in the last 168 hours. Coagulation profile No results for input(s): "INR", "PROTIME" in the last 168 hours. COVID-19 Labs  No results for  input(s): "DDIMER", "FERRITIN", "LDH", "CRP" in the last 72 hours.  No results found for: "SARSCOV2NAA"  CBC: Recent Labs  Lab 10/16/22 1950 10/17/22 0338  WBC 20.2* 12.4*  NEUTROABS  --  10.3*  HGB 14.9 13.8  HCT 46.0 44.6  MCV 88.3 89.0  PLT 372 330   Cardiac Enzymes: No results for input(s): "CKTOTAL", "CKMB", "CKMBINDEX", "TROPONINI" in the last 168 hours. BNP (last 3 results) No results for input(s): "PROBNP" in the last 8760 hours. CBG: No results for input(s): "GLUCAP" in the last 168 hours. D-Dimer: No results for input(s): "DDIMER" in the last 72 hours. Hgb A1c: No results for input(s): "HGBA1C" in the last 72 hours. Lipid Profile: No results for input(s): "CHOL", "HDL", "LDLCALC", "TRIG", "CHOLHDL", "LDLDIRECT" in the last 72 hours. Thyroid function studies: No results for input(s): "TSH", "T4TOTAL", "T3FREE", "THYROIDAB" in the last 72 hours.  Invalid input(s): "FREET3" Anemia work up: No results for input(s): "VITAMINB12", "FOLATE", "FERRITIN", "TIBC", "IRON", "RETICCTPCT" in the last 72 hours. Sepsis Labs: Recent Labs  Lab 10/16/22 1950 10/17/22 0338  WBC 20.2* 12.4*   Microbiology No results found for this or any previous visit (from the past 240 hour(s)).   Medications:    heparin  5,000 Units Subcutaneous Q8H   Continuous Infusions:  0.9 % NaCl with KCl 20 mEq / L 75 mL/hr at 10/17/22 0225      LOS: 0 days   Marinda Elk  [facility]  10/17/2022, 7:25 AM

## 2022-10-17 NOTE — Consult Note (Signed)
CC: abd pain  Requesting provider: Dr David Stall  HPI: Kirsten [name] is an 82 y.o. female who is here for SBO.  She was recently discharged for this on 8/14.  PMH sig for COPD and HTN.  PSH sig for sacral colpopexy and Lap LOA 6 days later in Oct 2023 at [facility].     Past Medical History:  Diagnosis Date   Allergy    occasional seasonal   Asthma    COPD (chronic obstructive pulmonary disease) (HCC)    Emphysema of lung (HCC)    H/O: hysterectomy    Heart murmur    HTN (hypertension)    HTN (hypertension)    IGT (impaired glucose tolerance)    Irregular heart rate    take Flecinade   OSA (obstructive sleep apnea) 12/08/2017   Osteopenia    Palpitations    Thyroid disease    hypothyroid   Tinnitus    Tremor, essential    Vertigo     Past Surgical History:  Procedure Laterality Date   CATARACT EXTRACTION     both eyes- February 2015   orthopedic procedure     both feet   TRIGGER FINGER RELEASE     right hand    Family History  Problem Relation Age of Onset   Kidney disease Mother    Cirrhosis Mother    Lung cancer Father    Heart disease Maternal Grandmother    Colon cancer Neg Hx    Esophageal cancer Neg Hx    Rectal cancer Neg Hx    Stomach cancer Neg Hx    Pancreatic cancer Neg Hx     Social:  reports that she has never smoked. She has never used smokeless tobacco. She reports that she does not drink alcohol and does not use drugs.  Allergies: No Known Allergies  Medications: I have reviewed the patient's current medications.  Results for orders placed or performed during the hospital encounter of 10/16/22 (from the past 48 hour(s))  Lipase, blood     Status: None   Collection Time: 10/16/22  7:50 PM  Result Value Ref Range   Lipase 31 11 - 51 U/L    Comment: Performed at [facility], [address], [place], [place] [address]  Comprehensive metabolic panel     Status: Abnormal   Collection Time: 10/16/22  7:50 PM  Result  Value Ref Range   Sodium 140 135 - 145 mmol/L   Potassium 3.6 3.5 - 5.1 mmol/L   Chloride 100 98 - 111 mmol/L   CO2 27 22 - 32 mmol/L   Glucose, Bld 124 (H) 70 - 99 mg/dL    Comment: Glucose reference range applies only to samples taken after fasting for at least 8 hours.   BUN 28 (H) 8 - 23 mg/dL   Creatinine, Ser 6.57 0.44 - 1.00 mg/dL   Calcium 9.5 8.9 - 84.6 mg/dL   Total Protein 7.8 6.5 - 8.1 g/dL   Albumin 4.1 3.5 - 5.0 g/dL   AST 21 15 - 41 U/L   ALT 18 0 - 44 U/L   Alkaline Phosphatase 83 38 - 126 U/L   Total Bilirubin 0.5 0.3 - 1.2 mg/dL   GFR, Estimated 60 (L) >60 mL/min    Comment: (NOTE) Calculated using the CKD-EPI Creatinine Equation (2021)    Anion gap 13 5 - 15    Comment: Performed at [facility], [address], [place], [place] [address]  CBC     Status: Abnormal   Collection Time: 10/16/22  7:50 PM  Result Value Ref Range   WBC 20.2 (H) 4.0 - 10.5 K/uL   RBC 5.21 (H) 3.87 - 5.11 MIL/uL   Hemoglobin 14.9 12.0 - 15.0 g/dL   HCT 62.9 52.8 - 41.3 %   MCV 88.3 80.0 - 100.0 fL   MCH 28.6 26.0 - 34.0 pg   MCHC 32.4 30.0 - 36.0 g/dL   RDW 24.4 01.0 - 27.2 %   Platelets 372 150 - 400 K/uL   nRBC 0.0 0.0 - 0.2 %    Comment: Performed at [facility], [address] [place], [place] [address]  Urinalysis, Routine w reflex microscopic -Urine, Clean Catch     Status: Abnormal   Collection Time: 10/16/22  8:48 PM  Result Value Ref Range   Color, Urine YELLOW YELLOW   APPearance CLEAR CLEAR   Specific Gravity, Urine 1.018 1.005 - 1.030   pH 7.0 5.0 - 8.0   Glucose, UA NEGATIVE NEGATIVE mg/dL   Hgb urine dipstick NEGATIVE NEGATIVE   Bilirubin Urine NEGATIVE NEGATIVE   Ketones, ur 5 (A) NEGATIVE mg/dL   Protein, ur NEGATIVE NEGATIVE mg/dL   Nitrite NEGATIVE NEGATIVE   Leukocytes,Ua NEGATIVE NEGATIVE   RBC / HPF 0-5 0 - 5 RBC/hpf   WBC, UA 0-5 0 - 5 WBC/hpf   Bacteria, UA NONE SEEN NONE SEEN   Squamous Epithelial / HPF 0-5  0 - 5 /HPF   Mucus PRESENT     Comment: Performed at [facility], [address] [place], [place] [address]  Basic metabolic panel     Status: Abnormal   Collection Time: 10/17/22  3:38 AM  Result Value Ref Range   Sodium 139 135 - 145 mmol/L   Potassium 3.8 3.5 - 5.1 mmol/L   Chloride 103 98 - 111 mmol/L   CO2 27 22 - 32 mmol/L   Glucose, Bld 117 (H) 70 - 99 mg/dL    Comment: Glucose reference range applies only to samples taken after fasting for at least 8 hours.   BUN 22 8 - 23 mg/dL   Creatinine, Ser 4.25 0.44 - 1.00 mg/dL   Calcium 8.6 (L) 8.9 - 10.3 mg/dL   GFR, Estimated >95 >63 mL/min    Comment: (NOTE) Calculated using the CKD-EPI Creatinine Equation (2021)    Anion gap 9 5 - 15    Comment: Performed at [facility], [address], [place], [place] [address]  Magnesium     Status: None   Collection Time: 10/17/22  3:38 AM  Result Value Ref Range   Magnesium 1.9 1.7 - 2.4 mg/dL    Comment: Performed at [facility], [address], [place], [place] [address]  CBC with Differential/Platelet     Status: Abnormal   Collection Time: 10/17/22  3:38 AM  Result Value Ref Range   WBC 12.4 (H) 4.0 - 10.5 K/uL   RBC 5.01 3.87 - 5.11 MIL/uL   Hemoglobin 13.8 12.0 - 15.0 g/dL   HCT 18.8 41.6 - 60.6 %   MCV 89.0 80.0 - 100.0 fL   MCH 27.5 26.0 - 34.0 pg   MCHC 30.9 30.0 - 36.0 g/dL   RDW 30.1 60.1 - 09.3 %   Platelets 330 150 - 400 K/uL   nRBC 0.0 0.0 - 0.2 %   Neutrophils Relative % 84 %   Neutro Abs 10.3 (H) 1.7 -  7.7 K/uL   Lymphocytes Relative 12 %   Lymphs Abs 1.5 0.7 - 4.0 K/uL   Monocytes Relative 4 %   Monocytes Absolute 0.5 0.1 - 1.0 K/uL   Eosinophils Relative 0 %   Eosinophils Absolute 0.0 0.0 - 0.5 K/uL   Basophils Relative 0 %   Basophils Absolute 0.0 0.0 - 0.1 K/uL   Immature Granulocytes 0 %   Abs Immature Granulocytes 0.04 0.00 - 0.07 K/uL    Comment: Performed at [facility],  [address], [place], [place] [address]    DG Abd 1 View  Result Date: 10/17/2022 CLINICAL DATA:  Nasogastric tube placement EXAM: ABDOMEN - 1 VIEW COMPARISON:  Yesterday FINDINGS: Enteric tube with tip and side port at the stomach. Excreting renal contrast from recent CT. Known dilated small bowel in the central abdomen. Symmetric aeration at the lung bases. IMPRESSION: Enteric tube with tip and side port at the stomach. Electronically Signed   By: Tiburcio Pea M.D.   On: 10/17/2022 04:09   CT ABDOMEN PELVIS W CONTRAST  Result Date: 10/16/2022 CLINICAL DATA:  Generalized abdominal pain and vomiting. Recent small bowel obstruction. EXAM: CT ABDOMEN AND PELVIS WITH CONTRAST TECHNIQUE: Multidetector CT imaging of the abdomen and pelvis was performed using the standard protocol following bolus administration of intravenous contrast. RADIATION DOSE REDUCTION: This exam was performed according to the departmental dose-optimization program which includes automated exposure control, adjustment of the mA and/or kV according to patient size and/or use of iterative reconstruction technique. CONTRAST:  OMNIPAQUE IOHEXOL 300 MG/ML  SOLN COMPARISON:  CT 10/03/2022.  X-ray 10/05/2022 and older FINDINGS: Lower chest: Linear opacity along the lung bases likely scar or atelectasis. No pleural effusion. Small hiatal hernia. Coronary artery calcifications are seen. Hepatobiliary: Fatty liver infiltration patent portal vein. Gallbladder is present. No space-occupying liver lesion. Pancreas: Unremarkable. No pancreatic ductal dilatation or surrounding inflammatory changes. Spleen: Spleen has a small cystic area inferiorly, too small to completely characterize but likely a benign lesion. No specific imaging follow-up. Spleen is nonenlarged. Adrenals/Urinary Tract: Adrenal glands are preserved. Mild bilateral renal atrophy. There are some benign Bosniak 1 and 2 renal cystic foci identified. No specific imaging  follow-up. Ureters have normal course and caliber. Preserved contours of the urinary bladder. Stomach/Bowel: There is some debris in the stomach. Stomach is nondilated. There is a second portion duodenal diverticulum. Large bowel is of normal course and caliber with scattered stool. Sigmoid colon diverticula. There are some dilated loops of fluid-filled small bowel in the mid to upper pelvis. Few air-fluid levels. These measure up to 3.5 cm in diameter. There are some small bowel stool appearance as well along loops in the right hemipelvis with an abrupt transition seen in the anterolateral right hemipelvis. Transition seen for example sagittal series 10, image 76, axial series 3 image 61 and 60 and coronal series 8, image 59. This in a similar location on the prior examination. No pneumatosis or portal venous gas. Vascular/Lymphatic: Aortic atherosclerosis. No enlarged abdominal or pelvic lymph nodes. Reproductive: Status post hysterectomy. No adnexal masses. Other: Mild stranding and mesenteric fluid.  No free air Musculoskeletal: Curvature of the lumbar spine. Scattered degenerative changes of the spine. Scattered degenerative changes of the pelvis. IMPRESSION: Similar to previous thirds mild-to-moderately dilated loops of small bowel with air-fluid levels, small bowel stool appearance and transition in the right lateral hemipelvis anteriorly. No pneumatosis or free air. Scattered free fluid. Fatty liver infiltration Electronically Signed   By: Scarlette Shorts  Chales Abrahams M.D.   On: 10/16/2022 21:13    ROS - all of the below systems have been reviewed with the patient and positives are indicated with bold text General: chills, fever or night sweats Eyes: blurry vision or double vision ENT: epistaxis or sore throat Hematologic/Lymphatic: bleeding problems, blood clots or swollen lymph nodes Endocrine: temperature intolerance or unexpected weight changes Resp: cough, shortness of breath, or wheezing CV: chest pain or  dyspnea on exertion GI: as per HPI GU: dysuria, trouble voiding, or hematuria     PE Blood pressure (!) 170/60, pulse 72, temperature 98 F (36.7 C), temperature source Oral, resp. rate 20, height 4\' 10"  (1.473 m), weight 49.6 kg, SpO2 95%. Constitutional: NAD; conversant; no deformities Eyes: Moist conjunctiva; no lid lag; anicteric; PERRL Neck: Trachea midline; no thyromegaly Lungs: Normal respiratory effort CV: RRR GI: Abd soft, distended MSK: Normal range of motion of extremities; no clubbing/cyanosis, intention tremor Psychiatric: Appropriate affect; alert and oriented x3  Results for orders placed or performed during the hospital encounter of 10/16/22 (from the past 48 hour(s))  Lipase, blood     Status: None   Collection Time: 10/16/22  7:50 PM  Result Value Ref Range   Lipase 31 11 - 51 U/L    Comment: Performed at [facility], [address], [place], [place] [address]  Comprehensive metabolic panel     Status: Abnormal   Collection Time: 10/16/22  7:50 PM  Result Value Ref Range   Sodium 140 135 - 145 mmol/L   Potassium 3.6 3.5 - 5.1 mmol/L   Chloride 100 98 - 111 mmol/L   CO2 27 22 - 32 mmol/L   Glucose, Bld 124 (H) 70 - 99 mg/dL    Comment: Glucose reference range applies only to samples taken after fasting for at least 8 hours.   BUN 28 (H) 8 - 23 mg/dL   Creatinine, Ser 6.04 0.44 - 1.00 mg/dL   Calcium 9.5 8.9 - 54.0 mg/dL   Total Protein 7.8 6.5 - 8.1 g/dL   Albumin 4.1 3.5 - 5.0 g/dL   AST 21 15 - 41 U/L   ALT 18 0 - 44 U/L   Alkaline Phosphatase 83 38 - 126 U/L   Total Bilirubin 0.5 0.3 - 1.2 mg/dL   GFR, Estimated 60 (L) >60 mL/min    Comment: (NOTE) Calculated using the CKD-EPI Creatinine Equation (2021)    Anion gap 13 5 - 15    Comment: Performed at [facility], [address], [place], [place] [address]  CBC     Status: Abnormal   Collection Time: 10/16/22  7:50 PM  Result Value Ref Range   WBC 20.2 (H)  4.0 - 10.5 K/uL   RBC 5.21 (H) 3.87 - 5.11 MIL/uL   Hemoglobin 14.9 12.0 - 15.0 g/dL   HCT 14.7 82.9 - 56.2 %   MCV 88.3 80.0 - 100.0 fL   MCH 28.6 26.0 - 34.0 pg   MCHC 32.4 30.0 - 36.0 g/dL   RDW 13.0 86.5 - 78.4 %   Platelets 372 150 - 400 K/uL   nRBC 0.0 0.0 - 0.2 %    Comment: Performed at [facility], [address], [place], [place] [address]  Urinalysis, Routine w reflex microscopic -Urine, Clean Catch     Status: Abnormal   Collection Time: 10/16/22  8:48 PM  Result Value Ref Range   Color, Urine YELLOW YELLOW   APPearance CLEAR CLEAR  Specific Gravity, Urine 1.018 1.005 - 1.030   pH 7.0 5.0 - 8.0   Glucose, UA NEGATIVE NEGATIVE mg/dL   Hgb urine dipstick NEGATIVE NEGATIVE   Bilirubin Urine NEGATIVE NEGATIVE   Ketones, ur 5 (A) NEGATIVE mg/dL   Protein, ur NEGATIVE NEGATIVE mg/dL   Nitrite NEGATIVE NEGATIVE   Leukocytes,Ua NEGATIVE NEGATIVE   RBC / HPF 0-5 0 - 5 RBC/hpf   WBC, UA 0-5 0 - 5 WBC/hpf   Bacteria, UA NONE SEEN NONE SEEN   Squamous Epithelial / HPF 0-5 0 - 5 /HPF   Mucus PRESENT     Comment: Performed at [facility], [address] [place], [place] [address]  Basic metabolic panel     Status: Abnormal   Collection Time: 10/17/22  3:38 AM  Result Value Ref Range   Sodium 139 135 - 145 mmol/L   Potassium 3.8 3.5 - 5.1 mmol/L   Chloride 103 98 - 111 mmol/L   CO2 27 22 - 32 mmol/L   Glucose, Bld 117 (H) 70 - 99 mg/dL    Comment: Glucose reference range applies only to samples taken after fasting for at least 8 hours.   BUN 22 8 - 23 mg/dL   Creatinine, Ser 6.04 0.44 - 1.00 mg/dL   Calcium 8.6 (L) 8.9 - 10.3 mg/dL   GFR, Estimated >54 >09 mL/min    Comment: (NOTE) Calculated using the CKD-EPI Creatinine Equation (2021)    Anion gap 9 5 - 15    Comment: Performed at [facility], [address], [place], [place] [address]  Magnesium     Status: None   Collection Time: 10/17/22  3:38 AM   Result Value Ref Range   Magnesium 1.9 1.7 - 2.4 mg/dL    Comment: Performed at [facility], [address] [place], [place] [address]  CBC with Differential/Platelet     Status: Abnormal   Collection Time: 10/17/22  3:38 AM  Result Value Ref Range   WBC 12.4 (H) 4.0 - 10.5 K/uL   RBC 5.01 3.87 - 5.11 MIL/uL   Hemoglobin 13.8 12.0 - 15.0 g/dL   HCT 56.2 13.0 - 86.5 %   MCV 89.0 80.0 - 100.0 fL   MCH 27.5 26.0 - 34.0 pg   MCHC 30.9 30.0 - 36.0 g/dL   RDW 78.4 69.6 - 29.5 %   Platelets 330 150 - 400 K/uL   nRBC 0.0 0.0 - 0.2 %   Neutrophils Relative % 84 %   Neutro Abs 10.3 (H) 1.7 - 7.7 K/uL   Lymphocytes Relative 12 %   Lymphs Abs 1.5 0.7 - 4.0 K/uL   Monocytes Relative 4 %   Monocytes Absolute 0.5 0.1 - 1.0 K/uL   Eosinophils Relative 0 %   Eosinophils Absolute 0.0 0.0 - 0.5 K/uL   Basophils Relative 0 %   Basophils Absolute 0.0 0.0 - 0.1 K/uL   Immature Granulocytes 0 %   Abs Immature Granulocytes 0.04 0.00 - 0.07 K/uL    Comment: Performed at [facility], [address] [place], [place] [address]    DG Abd 1 View  Result Date: 10/17/2022 CLINICAL DATA:  Nasogastric tube placement EXAM: ABDOMEN - 1 VIEW COMPARISON:  Yesterday FINDINGS: Enteric tube with tip and side port at the stomach. Excreting renal contrast from recent CT. Known dilated small bowel in the central abdomen. Symmetric aeration at the lung bases. IMPRESSION: Enteric tube with tip and side port at  the stomach. Electronically Signed   By: Tiburcio Pea M.D.   On: 10/17/2022 04:09   CT ABDOMEN PELVIS W CONTRAST  Result Date: 10/16/2022 CLINICAL DATA:  Generalized abdominal pain and vomiting. Recent small bowel obstruction. EXAM: CT ABDOMEN AND PELVIS WITH CONTRAST TECHNIQUE: Multidetector CT imaging of the abdomen and pelvis was performed using the standard protocol following bolus administration of intravenous contrast. RADIATION DOSE REDUCTION: This exam was  performed according to the departmental dose-optimization program which includes automated exposure control, adjustment of the mA and/or kV according to patient size and/or use of iterative reconstruction technique. CONTRAST:  OMNIPAQUE IOHEXOL 300 MG/ML  SOLN COMPARISON:  CT 10/03/2022.  X-ray 10/05/2022 and older FINDINGS: Lower chest: Linear opacity along the lung bases likely scar or atelectasis. No pleural effusion. Small hiatal hernia. Coronary artery calcifications are seen. Hepatobiliary: Fatty liver infiltration patent portal vein. Gallbladder is present. No space-occupying liver lesion. Pancreas: Unremarkable. No pancreatic ductal dilatation or surrounding inflammatory changes. Spleen: Spleen has a small cystic area inferiorly, too small to completely characterize but likely a benign lesion. No specific imaging follow-up. Spleen is nonenlarged. Adrenals/Urinary Tract: Adrenal glands are preserved. Mild bilateral renal atrophy. There are some benign Bosniak 1 and 2 renal cystic foci identified. No specific imaging follow-up. Ureters have normal course and caliber. Preserved contours of the urinary bladder. Stomach/Bowel: There is some debris in the stomach. Stomach is nondilated. There is a second portion duodenal diverticulum. Large bowel is of normal course and caliber with scattered stool. Sigmoid colon diverticula. There are some dilated loops of fluid-filled small bowel in the mid to upper pelvis. Few air-fluid levels. These measure up to 3.5 cm in diameter. There are some small bowel stool appearance as well along loops in the right hemipelvis with an abrupt transition seen in the anterolateral right hemipelvis. Transition seen for example sagittal series 10, image 76, axial series 3 image 61 and 60 and coronal series 8, image 59. This in a similar location on the prior examination. No pneumatosis or portal venous gas. Vascular/Lymphatic: Aortic atherosclerosis. No enlarged abdominal or pelvic  lymph nodes. Reproductive: Status post hysterectomy. No adnexal masses. Other: Mild stranding and mesenteric fluid.  No free air Musculoskeletal: Curvature of the lumbar spine. Scattered degenerative changes of the spine. Scattered degenerative changes of the pelvis. IMPRESSION: Similar to previous thirds mild-to-moderately dilated loops of small bowel with air-fluid levels, small bowel stool appearance and transition in the right lateral hemipelvis anteriorly. No pneumatosis or free air. Scattered free fluid. Fatty liver infiltration Electronically Signed   By: Karen Kays M.D.   On: 10/16/2022 21:13     A/P: Ceilidh Luker is an 82 y.o. female with early recurrence of her SBO.  CT shows TP in the RLQ with fecalization of SB.  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  I have recommended that we continue NG decompression today and proceed with laparoscopic, possible open lysis of adhesions tomorrow.    Vanita Panda, MD  Colorectal and General Surgery [facility]  Total time of evaluation, examination, counseling and implementing medical decisions was 78 mins.  high medical decision making.

## 2022-10-17 NOTE — ED Notes (Signed)
ED TO INPATIENT HANDOFF REPORT  Name/Age/Gender Kirsten [name] 82 y.o. female  Code Status    Code Status Orders  (From admission, onward)           Start     Ordered   10/16/22 2335  Do not attempt resuscitation (DNR)  Continuous       Question Answer Comment  If patient has no pulse and is not breathing Do Not Attempt Resuscitation   If patient has a pulse and/or is breathing: Medical Treatment Goals COMFORT MEASURES: Keep clean/warm/dry, use medication by any route; positioning, wound care and other measures to relieve pain/suffering; use oxygen, suction/manual treatment of airway obstruction for comfort; do not transfer unless for comfort needs.   Consent: Discussion documented in EHR or advanced directives reviewed      10/16/22 2335           Code Status History     Date Active Date Inactive Code Status Order ID Comments User Context   10/16/2022 2335 10/16/2022 2335 Full Code [id number]  Gery Pray, MD ED   10/03/2022 2220 10/06/2022 1628 DNR [id number]  Reva Bores, MD ED       Home/SNF/Other Home  Chief Complaint SBO (small bowel obstruction) (HCC) [K56.609]  Level of Care/Admitting Diagnosis ED Disposition     ED Disposition  Admit   Condition  --   Comment  Hospital Area: [facility] [[id number]]  Level of Care: Med-Surg [16]  May place patient in observation at [facility] or [facility] if equivalent level of care is available:: Yes  Covid Evaluation: Confirmed COVID Negative  Diagnosis: SBO (small bowel obstruction) (HCC) [440347]  Admitting Physician: Gery Pray [[id number]]  Attending Physician: Gery Pray [[id number]]          Medical History Past Medical History:  Diagnosis Date   Allergy    occasional seasonal   Asthma    COPD (chronic obstructive pulmonary disease) (HCC)    Emphysema of lung (HCC)    H/O: hysterectomy    Heart murmur    HTN (hypertension)    HTN (hypertension)    IGT (impaired glucose  tolerance)    Irregular heart rate    take Flecinade   OSA (obstructive sleep apnea) 12/08/2017   Osteopenia    Palpitations    Thyroid disease    hypothyroid   Tinnitus    Tremor, essential    Vertigo     Allergies No Known Allergies  IV Location/Drains/Wounds Patient Lines/Drains/Airways Status     Active Line/Drains/Airways     Name Placement date Placement time Site Days   Peripheral IV 10/16/22 20 G Anterior;Distal;Right;Upper Arm 10/16/22  2005  Arm  1            Labs/Imaging Results for orders placed or performed during the hospital encounter of 10/16/22 (from the past 48 hour(s))  Lipase, blood     Status: None   Collection Time: 10/16/22  7:50 PM  Result Value Ref Range   Lipase 31 11 - 51 U/L    Comment: Performed at [facility], [address] [place], [place] [address]  Comprehensive metabolic panel     Status: Abnormal   Collection Time: 10/16/22  7:50 PM  Result Value Ref Range   Sodium 140 135 - 145 mmol/L   Potassium 3.6 3.5 - 5.1 mmol/L   Chloride 100 98 - 111 mmol/L   CO2 27 22 - 32 mmol/L   Glucose, Bld 124 (H)  70 - 99 mg/dL    Comment: Glucose reference range applies only to samples taken after fasting for at least 8 hours.   BUN 28 (H) 8 - 23 mg/dL   Creatinine, Ser 2.72 0.44 - 1.00 mg/dL   Calcium 9.5 8.9 - 53.6 mg/dL   Total Protein 7.8 6.5 - 8.1 g/dL   Albumin 4.1 3.5 - 5.0 g/dL   AST 21 15 - 41 U/L   ALT 18 0 - 44 U/L   Alkaline Phosphatase 83 38 - 126 U/L   Total Bilirubin 0.5 0.3 - 1.2 mg/dL   GFR, Estimated 60 (L) >60 mL/min    Comment: (NOTE) Calculated using the CKD-EPI Creatinine Equation (2021)    Anion gap 13 5 - 15    Comment: Performed at [facility], [address], [place], [place] [address]  CBC     Status: Abnormal   Collection Time: 10/16/22  7:50 PM  Result Value Ref Range   WBC 20.2 (H) 4.0 - 10.5 K/uL   RBC 5.21 (H) 3.87 - 5.11 MIL/uL   Hemoglobin 14.9 12.0 - 15.0 g/dL    HCT 47.4 25.9 - 56.3 %   MCV 88.3 80.0 - 100.0 fL   MCH 28.6 26.0 - 34.0 pg   MCHC 32.4 30.0 - 36.0 g/dL   RDW 87.5 64.3 - 32.9 %   Platelets 372 150 - 400 K/uL   nRBC 0.0 0.0 - 0.2 %    Comment: Performed at [facility], [address], [place], [place] [address]  Urinalysis, Routine w reflex microscopic -Urine, Clean Catch     Status: Abnormal   Collection Time: 10/16/22  8:48 PM  Result Value Ref Range   Color, Urine YELLOW YELLOW   APPearance CLEAR CLEAR   Specific Gravity, Urine 1.018 1.005 - 1.030   pH 7.0 5.0 - 8.0   Glucose, UA NEGATIVE NEGATIVE mg/dL   Hgb urine dipstick NEGATIVE NEGATIVE   Bilirubin Urine NEGATIVE NEGATIVE   Ketones, ur 5 (A) NEGATIVE mg/dL   Protein, ur NEGATIVE NEGATIVE mg/dL   Nitrite NEGATIVE NEGATIVE   Leukocytes,Ua NEGATIVE NEGATIVE   RBC / HPF 0-5 0 - 5 RBC/hpf   WBC, UA 0-5 0 - 5 WBC/hpf   Bacteria, UA NONE SEEN NONE SEEN   Squamous Epithelial / HPF 0-5 0 - 5 /HPF   Mucus PRESENT     Comment: Performed at [facility], [address], [place], [place] [address]   CT ABDOMEN PELVIS W CONTRAST  Result Date: 10/16/2022 CLINICAL DATA:  Generalized abdominal pain and vomiting. Recent small bowel obstruction. EXAM: CT ABDOMEN AND PELVIS WITH CONTRAST TECHNIQUE: Multidetector CT imaging of the abdomen and pelvis was performed using the standard protocol following bolus administration of intravenous contrast. RADIATION DOSE REDUCTION: This exam was performed according to the departmental dose-optimization program which includes automated exposure control, adjustment of the mA and/or kV according to patient size and/or use of iterative reconstruction technique. CONTRAST:  OMNIPAQUE IOHEXOL 300 MG/ML  SOLN COMPARISON:  CT 10/03/2022.  X-ray 10/05/2022 and older FINDINGS: Lower chest: Linear opacity along the lung bases likely scar or atelectasis. No pleural effusion. Small hiatal hernia. Coronary artery  calcifications are seen. Hepatobiliary: Fatty liver infiltration patent portal vein. Gallbladder is present. No space-occupying liver lesion. Pancreas: Unremarkable. No pancreatic ductal dilatation or surrounding inflammatory changes. Spleen: Spleen has a small cystic area inferiorly, too small to completely characterize but likely a benign lesion. No specific imaging  follow-up. Spleen is nonenlarged. Adrenals/Urinary Tract: Adrenal glands are preserved. Mild bilateral renal atrophy. There are some benign Bosniak 1 and 2 renal cystic foci identified. No specific imaging follow-up. Ureters have normal course and caliber. Preserved contours of the urinary bladder. Stomach/Bowel: There is some debris in the stomach. Stomach is nondilated. There is a second portion duodenal diverticulum. Large bowel is of normal course and caliber with scattered stool. Sigmoid colon diverticula. There are some dilated loops of fluid-filled small bowel in the mid to upper pelvis. Few air-fluid levels. These measure up to 3.5 cm in diameter. There are some small bowel stool appearance as well along loops in the right hemipelvis with an abrupt transition seen in the anterolateral right hemipelvis. Transition seen for example sagittal series 10, image 76, axial series 3 image 61 and 60 and coronal series 8, image 59. This in a similar location on the prior examination. No pneumatosis or portal venous gas. Vascular/Lymphatic: Aortic atherosclerosis. No enlarged abdominal or pelvic lymph nodes. Reproductive: Status post hysterectomy. No adnexal masses. Other: Mild stranding and mesenteric fluid.  No free air Musculoskeletal: Curvature of the lumbar spine. Scattered degenerative changes of the spine. Scattered degenerative changes of the pelvis. IMPRESSION: Similar to previous thirds mild-to-moderately dilated loops of small bowel with air-fluid levels, small bowel stool appearance and transition in the right lateral hemipelvis anteriorly. No  pneumatosis or free air. Scattered free fluid. Fatty liver infiltration Electronically Signed   By: Karen Kays M.D.   On: 10/16/2022 21:13    Pending Labs Unresulted Labs (From admission, onward)     Start     Ordered   10/17/22 0500  Basic metabolic panel  Tomorrow morning,   R        10/16/22 2334   10/17/22 0500  Magnesium  Tomorrow morning,   R        10/16/22 2334   10/17/22 0500  CBC with Differential/Platelet  Tomorrow morning,   R        10/16/22 2334            Vitals/Pain Today's Vitals   10/16/22 2044 10/16/22 2127 10/16/22 2250 10/17/22 0032  BP: (!) 176/71   (!) 176/72  Pulse: 87   80  Resp: 19   18  Temp:  98.4 F (36.9 C)  97.7 F (36.5 C)  TempSrc:  Oral    SpO2: 91%   100%  PainSc:   0-No pain     Isolation Precautions No active isolations  Medications Medications  heparin injection 5,000 Units (has no administration in time range)  acetaminophen (TYLENOL) tablet 650 mg (has no administration in time range)    Or  acetaminophen (TYLENOL) suppository 650 mg (has no administration in time range)  senna-docusate (Senokot-S) tablet 1 tablet (has no administration in time range)  0.9 % NaCl with KCl 20 mEq/ L  infusion (has no administration in time range)  HYDROmorphone (DILAUDID) injection 0.5 mg (has no administration in time range)  ondansetron (ZOFRAN) injection 4 mg (has no administration in time range)  lactated ringers bolus 1,000 mL (1,000 mLs Intravenous New Bag/Given 10/16/22 2005)  morphine (PF) 4 MG/ML injection 4 mg (4 mg Intravenous Given 10/16/22 2005)  ondansetron (ZOFRAN) injection 4 mg (4 mg Intravenous Given 10/16/22 2006)  iohexol (OMNIPAQUE) 300 MG/ML solution 100 mL (100 mLs Intravenous Contrast Given 10/16/22 2045)  morphine (PF) 4 MG/ML injection 4 mg (4 mg Intravenous Given 10/16/22 2239)    Mobility walks

## 2022-10-17 NOTE — Evaluation (Signed)
Physical Therapy Evaluation Patient Details Name: Kirsten [name] MRN: [id number] DOB: [birth year] Today's Date: 10/17/2022  History of Present Illness  Pt admitted from home 2* SBO.  Pt with hx of COPD, tremors, htn, Hypothyroid, tinnituus and Vertigo  Clinical Impression  Pt admitted as above and presenting with functional mobility limitations 2* abdominal pain and ambulatory balance deficits.  Pt should progress to dc home with assist of family/friends.      If plan is discharge home, recommend the following: Assist for transportation;A little help with walking and/or transfers;Assistance with cooking/housework   Can travel by private vehicle        Equipment Recommendations None recommended by PT (Pt states not interested in using RW here or at home)  Recommendations for Other Services       Functional Status Assessment Patient has had a recent decline in their functional status and demonstrates the ability to make significant improvements in function in a reasonable and predictable amount of time.     Precautions / Restrictions Precautions Precautions: Fall Precaution Comments: NG tube Restrictions Weight Bearing Restrictions: No      Mobility  Bed Mobility Overal bed mobility: Modified Independent             General bed mobility comments: supine <>sit unassisted    Transfers Overall transfer level: Needs assistance Equipment used: Rolling walker (2 wheels) Transfers: Sit to/from Stand Sit to Stand: Supervision           General transfer comment: Sup for saftey only    Ambulation/Gait Ambulation/Gait assistance: Min assist, Contact guard assist Gait Distance (Feet): 300 Feet Assistive device: 1 person hand held assist Gait Pattern/deviations: Step-through pattern, Decreased step length - right, Decreased step length - left, Shuffle, Staggering left, Staggering right       General Gait Details: General instability with pt reaching for railing to  steady but refuses to use RW when offered  Stairs            Wheelchair Mobility     Tilt Bed    Modified Rankin (Stroke Patients Only)       Balance Overall balance assessment: Needs assistance Sitting-balance support: No upper extremity supported, Feet supported Sitting balance-Leahy Scale: Good     Standing balance support: No upper extremity supported Standing balance-Leahy Scale: Fair                               Pertinent Vitals/Pain Pain Assessment Pain Assessment: 0-10 Pain Score: 6  Pain Location: abdomen Pain Descriptors / Indicators: Sore Pain Intervention(s): Monitored during session, Limited activity within patient's tolerance, Patient requesting pain meds-RN notified, RN gave pain meds during session    Home Living Family/patient expects to be discharged to:: Private residence Living Arrangements: Alone Available Help at Discharge: Family;Friend(s);Available 24 hours/day Type of Home: House Home Access: Level entry       Home Layout: Two level;Able to live on main level with bedroom/bathroom Home Equipment: Gilmer Mor - single point      Prior Function Prior Level of Function : Independent/Modified Independent                     Extremity/Trunk Assessment   Upper Extremity Assessment Upper Extremity Assessment: Overall WFL for tasks assessed    Lower Extremity Assessment Lower Extremity Assessment: Overall WFL for tasks assessed    Cervical / Trunk Assessment Cervical / Trunk Assessment: Normal  Communication  Communication Communication: No apparent difficulties  Cognition Arousal: Alert Behavior During Therapy: WFL for tasks assessed/performed, Anxious Overall Cognitive Status: Within Functional Limits for tasks assessed                                          General Comments      Exercises     Assessment/Plan    PT Assessment Patient needs continued PT services  PT Problem List  Decreased activity tolerance;Decreased balance;Decreased mobility;Decreased knowledge of use of DME       PT Treatment Interventions DME instruction;Gait training;Stair training;Functional mobility training;Therapeutic activities;Therapeutic exercise;Balance training;Patient/family education    PT Goals (Current goals can be found in the Care Plan section)  Acute Rehab PT Goals Patient Stated Goal: Regain IND and return home PT Goal Formulation: With patient Time For Goal Achievement: 10/31/22 Potential to Achieve Goals: Good    Frequency Min 1X/week     Co-evaluation               AM-PAC PT "6 Clicks" Mobility  Outcome Measure Help needed turning from your back to your side while in a flat bed without using bedrails?: None Help needed moving from lying on your back to sitting on the side of a flat bed without using bedrails?: None Help needed moving to and from a bed to a chair (including a wheelchair)?: A Little Help needed standing up from a chair using your arms (e.g., wheelchair or bedside chair)?: A Little Help needed to walk in hospital room?: A Little Help needed climbing 3-5 steps with a railing? : A Little 6 Click Score: 20    End of Session Equipment Utilized During Treatment: Gait belt Activity Tolerance: Patient tolerated treatment well Patient left: in bed;with call bell/phone within reach;with bed alarm set Nurse Communication: Mobility status PT Visit Diagnosis: Unsteadiness on feet (R26.81)    Time: 5784-6962 PT Time Calculation (min) (ACUTE ONLY): 27 min   Charges:   PT Evaluation $PT Eval Low Complexity: 1 Low PT Treatments $Gait Training: 8-22 mins PT General Charges $$ ACUTE PT VISIT: 1 Visit         Mauro Kaufmann PT Acute Rehabilitation Services Pager [phone] Office [phone]   Marithza Malachi 10/17/2022, 3:33 PM

## 2022-10-18 DIAGNOSIS — I10 Essential (primary) hypertension: Secondary | ICD-10-CM | POA: Diagnosis not present

## 2022-10-18 DIAGNOSIS — K56609 Unspecified intestinal obstruction, unspecified as to partial versus complete obstruction: Secondary | ICD-10-CM | POA: Diagnosis not present

## 2022-10-18 LAB — BASIC METABOLIC PANEL
Anion gap: 7 (ref 5–15)
BUN: 19 mg/dL (ref 8–23)
CO2: 23 mmol/L (ref 22–32)
Calcium: 8 mg/dL — ABNORMAL LOW (ref 8.9–10.3)
Chloride: 109 mmol/L (ref 98–111)
Creatinine, Ser: 0.73 mg/dL (ref 0.44–1.00)
GFR, Estimated: >60 mL/min (ref >60)
Glucose, Bld: 79 mg/dL (ref 70–99)
Potassium: 4.1 mmol/L (ref 3.5–5.1)
Sodium: 139 mmol/L (ref 135–145)

## 2022-10-18 MED ORDER — DEXAMETHASONE SODIUM PHOSPHATE 10 MG/ML IJ SOLN
INTRAMUSCULAR | Status: AC
  Filled 2022-10-18: qty 1

## 2022-10-18 MED ORDER — PHENOL 1.4 % MT LIQD
1.0000 | OROMUCOSAL | Status: DC | PRN
  Administered 2022-10-18: 1 via OROMUCOSAL
  Filled 2022-10-18: qty 177

## 2022-10-18 MED ORDER — FENTANYL CITRATE (PF) 100 MCG/2ML IJ SOLN
INTRAMUSCULAR | Status: AC
  Filled 2022-10-18: qty 2

## 2022-10-18 MED ORDER — PROPOFOL 10 MG/ML IV BOLUS
INTRAVENOUS | Status: AC
  Filled 2022-10-18: qty 20

## 2022-10-18 MED ORDER — LIDOCAINE HCL (PF) 2 % IJ SOLN
INTRAMUSCULAR | Status: AC
  Filled 2022-10-18: qty 5

## 2022-10-18 MED ORDER — ONDANSETRON HCL 4 MG/2ML IJ SOLN
INTRAMUSCULAR | Status: AC
  Filled 2022-10-18: qty 2

## 2022-10-18 MED ORDER — ROCURONIUM BROMIDE 10 MG/ML (PF) SYRINGE
PREFILLED_SYRINGE | INTRAVENOUS | Status: AC
  Filled 2022-10-18: qty 10

## 2022-10-18 NOTE — Progress Notes (Addendum)
CCC Pre-op Review  Pre-op checklist:   NPO: since MN  Labs: BMP normal  Consent: No orders  H&P: consult note 8/25, H/P 8/24  Vitals: stable  O2 requirements: NO  MAR/PTA review: Hold Heparin am dose  IV: 20 G R upper Arm  Floor nurse name:  Tamikeal  Additional info:  Patient has NG tube  No orders for Dr Maisie Fus.  Will need to contact  Patient [place] currently refusing surgery per Rn on unit.   6:11 AM Dr Maisie Fus paged  6:30 AM Called Dr Maisie Fus on her cell, left voicemail to return call  Evern Bio BSN, RN RN Speciality Coordinator - Perioperative Services [facility] [phone]

## 2022-10-18 NOTE — Progress Notes (Signed)
SBO (small bowel obstruction) (HCC)  Subjective: Pt refused surgery this AM  Objective: Vital signs in last 24 hours: Temp:  [98.1 F (36.7 C)-99.4 F (37.4 C)] 98.1 F (36.7 C) (08/26 0600) Pulse Rate:  [72-86] 83 (08/26 0600) Resp:  [17-18] 17 (08/26 0600) BP: (134-159)/(62-72) 134/72 (08/26 0600) SpO2:  [88 %-95 %] 95 % (08/26 0823) Last BM Date : 10/16/22  Intake/Output from previous day: 08/25 0701 - 08/26 0700 In: 449.9 [I.V.:449.9] Out: 550 [Emesis/NG output:550] Intake/Output this shift: No intake/output data recorded.  General appearance: alert and cooperative GI: soft, mild distention  Lab Results:  Results for orders placed or performed during the hospital encounter of 10/16/22 (from the past 24 hour(s))  Basic metabolic panel     Status: Abnormal   Collection Time: 10/18/22  4:22 AM  Result Value Ref Range   Sodium 139 135 - 145 mmol/L   Potassium 4.1 3.5 - 5.1 mmol/L   Chloride 109 98 - 111 mmol/L   CO2 23 22 - 32 mmol/L   Glucose, Bld 79 70 - 99 mg/dL   BUN 19 8 - 23 mg/dL   Creatinine, Ser 6.57 0.44 - 1.00 mg/dL   Calcium 8.0 (L) 8.9 - 10.3 mg/dL   GFR, Estimated >84 >69 mL/min   Anion gap 7 5 - 15     Studies/Results Radiology     MEDS, Scheduled  heparin  5,000 Units Subcutaneous Q8H   [START ON 10/22/2022] levothyroxine  50 mcg Intravenous Daily     Assessment: SBO (small bowel obstruction) (HCC) recurrent  Surgery discussed again with patient today.  All questions answered.  She agrees to proceed with surgery tomorrow.  Plan: Cont NG, NPO   LOS: 1 day    Vanita Panda, MD [facility]  Patient's medical decision making was moderate    10/18/2022 9:51 AM

## 2022-10-18 NOTE — Plan of Care (Signed)
  Problem: Coping: Goal: Level of anxiety will decrease Outcome: Progressing   Problem: Pain Managment: Goal: General experience of comfort will improve Outcome: Progressing   Problem: Safety: Goal: Ability to remain free from injury will improve Outcome: Progressing   

## 2022-10-18 NOTE — Progress Notes (Signed)
[facility] PROGRESS NOTE    Progress Note  Kirsten [name]  UJW:[id number] DOB: 1940/03/08 DOA: 10/16/2022 PCP: [name]., MD     Brief Narrative:   Kirsten [name] is an 82 y.o. female past medical history significant for COPD, essential hypertension who underwent sacral plexi on October 2023, postoperatively she developed SBO and discharged on 10/06/2022 treated conservatively.  In the morning of admission started developing again abdominal pain came into the ER CT scan showed SBO.  Assessment/Plan:   SBO (small bowel obstruction) (HCC): Currently on n.p.o., IV fluids and NG tube with intermittent suction. General surgery has been recommended laparoscopic surgery. Try to keep potassium greater than 4 magnesium greater than 2 out of bed to chair consult physical therapy.  Essential hypertension: Blood pressure stable continue to hold all oral meds.  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  History of SVT: Continue flecainide verapamil and aspirin. Continue to monitor.  Hypothyroidism: Hold Synthroid will consult pharmacy to start IV Synthroid.  Essential tremor: Hold oral medication.   DVT prophylaxis: lovenox Family Communication:none Status is: Observation The patient will require care spanning > 2 midnights and should be moved to inpatient because: SBO    Code Status:     Code Status Orders  (From admission, onward)           Start     Ordered   10/16/22 2335  Do not attempt resuscitation (DNR)  Continuous       Question Answer Comment  If patient has no pulse and is not breathing Do Not Attempt Resuscitation   If patient has a pulse and/or is breathing: Medical Treatment Goals COMFORT MEASURES: Keep clean/warm/dry, use medication by any route; positioning, wound care and other measures to relieve pain/suffering; use oxygen, suction/manual treatment of airway obstruction for comfort; do not transfer unless for comfort needs.    Consent: Discussion documented in EHR or advanced directives reviewed      10/16/22 2335           Code Status History     Date Active Date Inactive Code Status Order ID Comments User Context   10/16/2022 2335 10/16/2022 2335 Full Code [id number]  Gery Pray, MD ED   10/03/2022 2220 10/06/2022 1628 DNR [id number]  Reva Bores, MD ED      Advance Directive Documentation    Flowsheet Row Most Recent Value  Type of Advance Directive Healthcare Power of Attorney, Living will  Pre-existing out of facility DNR order (yellow form or pink MOST form) --  "MOST" Form in Place? --         IV Access:   Peripheral IV   Procedures and diagnostic studies:   DG Abd Portable 1V-Small Bowel Obstruction Protocol-initial, 8 hr delay  Result Date: 10/17/2022 CLINICAL DATA:  8 hour follow-up small-bowel film EXAM: PORTABLE ABDOMEN - 1 VIEW COMPARISON:  Film from earlier in the same day. FINDINGS: Mild scattered large and small bowel gas is noted. Previously administered contrast lies within the stomach as well as in the dilated loops of small bowel. No definitive colonic contrast is noted at this time. No bony abnormality is seen. No free air is noted. IMPRESSION: No colonic contrast is noted at this time. 24 hour follow-up film is recommended. Electronically Signed   By: Alcide Clever M.D.   On: 10/17/2022 20:35   DG Abd 1 View  Result Date: 10/17/2022 CLINICAL DATA:  Nasogastric tube placement EXAM: ABDOMEN - 1 VIEW COMPARISON:  Yesterday FINDINGS: Enteric tube with tip and side port at the stomach. Excreting renal contrast from recent CT. Known dilated small bowel in the central abdomen. Symmetric aeration at the lung bases. IMPRESSION: Enteric tube with tip and side port at the stomach. Electronically Signed   By: Tiburcio Pea M.D.   On: 10/17/2022 04:09   CT ABDOMEN PELVIS W CONTRAST  Result Date: 10/16/2022 CLINICAL DATA:  Generalized abdominal pain and vomiting. Recent small bowel  obstruction. EXAM: CT ABDOMEN AND PELVIS WITH CONTRAST TECHNIQUE: Multidetector CT imaging of the abdomen and pelvis was performed using the standard protocol following bolus administration of intravenous contrast. RADIATION DOSE REDUCTION: This exam was performed according to the departmental dose-optimization program which includes automated exposure control, adjustment of the mA and/or kV according to patient size and/or use of iterative reconstruction technique. CONTRAST:  OMNIPAQUE IOHEXOL 300 MG/ML  SOLN COMPARISON:  CT 10/03/2022.  X-ray 10/05/2022 and older FINDINGS: Lower chest: Linear opacity along the lung bases likely scar or atelectasis. No pleural effusion. Small hiatal hernia. Coronary artery calcifications are seen. Hepatobiliary: Fatty liver infiltration patent portal vein. Gallbladder is present. No space-occupying liver lesion. Pancreas: Unremarkable. No pancreatic ductal dilatation or surrounding inflammatory changes. Spleen: Spleen has a small cystic area inferiorly, too small to completely characterize but likely a benign lesion. No specific imaging follow-up. Spleen is nonenlarged. Adrenals/Urinary Tract: Adrenal glands are preserved. Mild bilateral renal atrophy. There are some benign Bosniak 1 and 2 renal cystic foci identified. No specific imaging follow-up. Ureters have normal course and caliber. Preserved contours of the urinary bladder. Stomach/Bowel: There is some debris in the stomach. Stomach is nondilated. There is a second portion duodenal diverticulum. Large bowel is of normal course and caliber with scattered stool. Sigmoid colon diverticula. There are some dilated loops of fluid-filled small bowel in the mid to upper pelvis. Few air-fluid levels. These measure up to 3.5 cm in diameter. There are some small bowel stool appearance as well along loops in the right hemipelvis with an abrupt transition seen in the anterolateral right hemipelvis. Transition seen for example  sagittal series 10, image 76, axial series 3 image 61 and 60 and coronal series 8, image 59. This in a similar location on the prior examination. No pneumatosis or portal venous gas. Vascular/Lymphatic: Aortic atherosclerosis. No enlarged abdominal or pelvic lymph nodes. Reproductive: Status post hysterectomy. No adnexal masses. Other: Mild stranding and mesenteric fluid.  No free air Musculoskeletal: Curvature of the lumbar spine. Scattered degenerative changes of the spine. Scattered degenerative changes of the pelvis. IMPRESSION: Similar to previous thirds mild-to-moderately dilated loops of small bowel with air-fluid levels, small bowel stool appearance and transition in the right lateral hemipelvis anteriorly. No pneumatosis or free air. Scattered free fluid. Fatty liver infiltration Electronically Signed   By: Karen Kays M.D.   On: 10/16/2022 21:13     Medical Consultants:   None.   Subjective:    Kirsten [name] she relates her pain is controlled.  Objective:    Vitals:   10/17/22 1848 10/17/22 2059 10/18/22 0600 10/18/22 0823  BP:  (!) 147/62 134/72   Pulse:  86 83   Resp:   17   Temp:  99.4 F (37.4 C) 98.1 F (36.7 C)   TempSrc:  Oral Oral   SpO2: 92% 90% 90% 95%  Weight:      Height:       SpO2: 95 %   Intake/Output Summary (Last 24 hours) at 10/18/2022 8295 Last  data filed at 10/18/2022 0500 Gross per 24 hour  Intake 449.85 ml  Output 550 ml  Net -100.15 ml   Filed Weights   10/17/22 0213  Weight: 49.6 kg    Exam: General exam: In no acute distress. Respiratory system: Good air movement and clear to auscultation. Cardiovascular system: S1 & S2 heard, RRR. No JVD. Gastrointestinal system: Abdomen is nondistended, soft and nontender.  Extremities: No pedal edema. Skin: No rashes, lesions or ulcers Psychiatry: Judgement and insight appear normal. Mood & affect appropriate. Data Reviewed:    Labs: Basic Metabolic Panel: Recent Labs  Lab 10/16/22 1950  10/17/22 0338 10/18/22 0422  NA 140 139 139  K 3.6 3.8 4.1  CL 100 103 109  CO2 27 27 23   GLUCOSE 124* 117* 79  BUN 28* 22 19  CREATININE 0.96 0.82 0.73  CALCIUM 9.5 8.6* 8.0*  MG  --  1.9  --    GFR Estimated Creatinine Clearance: 39.3 mL/min (by C-G formula based on SCr of 0.73 mg/dL). Liver Function Tests: Recent Labs  Lab 10/16/22 1950  AST 21  ALT 18  ALKPHOS 83  BILITOT 0.5  PROT 7.8  ALBUMIN 4.1   Recent Labs  Lab 10/16/22 1950  LIPASE 31   No results for input(s): "AMMONIA" in the last 168 hours. Coagulation profile No results for input(s): "INR", "PROTIME" in the last 168 hours. COVID-19 Labs  No results for input(s): "DDIMER", "FERRITIN", "LDH", "CRP" in the last 72 hours.  No results found for: "SARSCOV2NAA"  CBC: Recent Labs  Lab 10/16/22 1950 10/17/22 0338  WBC 20.2* 12.4*  NEUTROABS  --  10.3*  HGB 14.9 13.8  HCT 46.0 44.6  MCV 88.3 89.0  PLT 372 330   Cardiac Enzymes: No results for input(s): "CKTOTAL", "CKMB", "CKMBINDEX", "TROPONINI" in the last 168 hours. BNP (last 3 results) No results for input(s): "PROBNP" in the last 8760 hours. CBG: No results for input(s): "GLUCAP" in the last 168 hours. D-Dimer: No results for input(s): "DDIMER" in the last 72 hours. Hgb A1c: No results for input(s): "HGBA1C" in the last 72 hours. Lipid Profile: No results for input(s): "CHOL", "HDL", "LDLCALC", "TRIG", "CHOLHDL", "LDLDIRECT" in the last 72 hours. Thyroid function studies: No results for input(s): "TSH", "T4TOTAL", "T3FREE", "THYROIDAB" in the last 72 hours.  Invalid input(s): "FREET3" Anemia work up: No results for input(s): "VITAMINB12", "FOLATE", "FERRITIN", "TIBC", "IRON", "RETICCTPCT" in the last 72 hours. Sepsis Labs: Recent Labs  Lab 10/16/22 1950 10/17/22 0338  WBC 20.2* 12.4*   Microbiology No results found for this or any previous visit (from the past 240 hour(s)).   Medications:    heparin  5,000 Units Subcutaneous  Q8H   [START ON 10/22/2022] levothyroxine  50 mcg Intravenous Daily   Continuous Infusions:  0.9 % NaCl with KCl 20 mEq / L 75 mL/hr at 10/18/22 0617      LOS: 1 day   Marinda Elk  [facility]  10/18/2022, 9:03 AM

## 2022-10-18 NOTE — TOC CM/SW Note (Signed)
Transition of Care [facility]) - Inpatient Brief Assessment   Patient Details  Name: Kirsten [name] MRN: [id number] Date of Birth: [birth year]  [facility] CM/SW Contact:    Howell Rucks, RN Phone Number: 10/18/2022, 11:37 AM   Clinical Narrative: Met with pt at bedside to introduce role of TOC/NCM and review for dc planning. Pt confirms she has PCP and pharmacy in place, no current home care services or home DME, pt reports she feels safe returning home and has transportation available at discharge.  PT eval completed with no recommendations. TOC Brief Assessment completed. No TOC needs identified.      Transition of Care Asessment:   Patient has primary care physician: Yes Home environment has been reviewed: resides alone Prior level of function:: Independent Prior/Current Home Services: No current home services Social Determinants of Health Reivew: SDOH reviewed no interventions necessary Readmission risk has been reviewed: Yes Transition of care needs: no transition of care needs at this time

## 2022-10-18 NOTE — Progress Notes (Signed)
Mobility Specialist - Progress Note   10/18/22 0900  Mobility  Activity Ambulated with assistance in hallway  Level of Assistance Modified independent, requires aide device or extra time  Assistive Device None  Distance Ambulated (ft) 350 ft  Range of Motion/Exercises Active  Activity Response Tolerated well  Mobility Referral Yes  $Mobility charge 1 Mobility  Mobility Specialist Start Time (ACUTE ONLY) 0859  Mobility Specialist Stop Time (ACUTE ONLY) 0910  Mobility Specialist Time Calculation (min) (ACUTE ONLY) 11 min   Pt received in bed and agreed to mobility. Had no issues throughout session, returned to bed with all needs met.  Marilynne Halsted Mobility Specialist

## 2022-10-18 NOTE — Anesthesia Preprocedure Evaluation (Signed)
Anesthesia Evaluation  Patient identified by MRN, date of birth, ID band Patient awake    Reviewed: Allergy & Precautions, NPO status , Patient's Chart, lab work & pertinent test results  History of Anesthesia Complications (+) PONV and history of anesthetic complications  Airway Mallampati: I       Dental no notable dental hx.    Pulmonary asthma , COPD,  COPD inhaler   Pulmonary exam normal        Cardiovascular hypertension, Pt. on medications Normal cardiovascular exam+ dysrhythmias Supra Ventricular Tachycardia      Neuro/Psych negative neurological ROS  negative psych ROS   GI/Hepatic Neg liver ROS,,,NGT present L nares   Endo/Other  Hypothyroidism    Renal/GU negative Renal ROSLab Results      Component                Value               Date                           K                        4.1                 10/18/2022                       CREATININE               0.73                10/18/2022                negative genitourinary   Musculoskeletal   Abdominal Normal abdominal exam  (+)   Peds  Hematology negative hematology ROS (+) Lab Results      Component                Value               Date                      WBC                      12.4 (H)            10/17/2022                HGB                      13.8                10/17/2022                HCT                      44.6                10/17/2022                MCV                      89.0                10/17/2022                PLT  330                 10/17/2022              Anesthesia Other Findings   Reproductive/Obstetrics                              Anesthesia Physical Anesthesia Plan  ASA: 3  Anesthesia Plan: General   Post-op Pain Management: Ofirmev IV (intra-op)* and Precedex   Induction: Intravenous  PONV Risk Score and Plan: 4 or greater and Treatment may vary  due to age or medical condition, Ondansetron and Dexamethasone  Airway Management Planned: Oral ETT  Additional Equipment: None  Intra-op Plan:   Post-operative Plan: Extubation in OR  Informed Consent: I have reviewed the patients History and Physical, chart, labs and discussed the procedure including the risks, benefits and alternatives for the proposed anesthesia with the patient or authorized representative who has indicated his/her understanding and acceptance.   Patient has DNR.   Dental advisory given  Plan Discussed with:   Anesthesia Plan Comments: (ft ventricle: The cavity size was normal. Systolic function was    normal. The estimated ejection fraction was in the range of 55%    to 60%. Wall motion was normal; there were no regional wall    motion abnormalities. Doppler parameters are consistent with    abnormal left ventricular relaxation (grade 1 diastolic    dysfunction). Doppler parameters are consistent with high    ventricular filling pressure.  - Aortic valve: Transvalvular velocity was within the normal range.    There was no stenosis. There was mild regurgitation.  - Mitral valve: Transvalvular velocity was within the normal range.    There was no evidence for stenosis. There was trivial    regurgitation.  - Left atrium: The atrium was mildly dilated.  - Right ventricle: The cavity size was normal. Wall thickness was    normal. Systolic function was normal.  - Atrial septum: No defect or patent foramen ovale was identified    by color flow Doppler.  - Tricuspid valve: There was mild regurgitation.  - Pulmonary arteries: Systolic pressure was mildly increased. PA    peak pressure: 42 mm Hg (S).   ---------  )         Anesthesia Quick Evaluation

## 2022-10-19 ENCOUNTER — Encounter (HOSPITAL_COMMUNITY)
Admission: EM | Disposition: A | Discharge status: Home / Self Care | Payer: Self-pay | Source: Home / Self Care | Attending: Internal Medicine

## 2022-10-19 ENCOUNTER — Encounter (HOSPITAL_COMMUNITY): Payer: Self-pay | Admitting: Internal Medicine

## 2022-10-19 ENCOUNTER — Inpatient Hospital Stay (HOSPITAL_COMMUNITY): Payer: Medicare Other | Admitting: Anesthesiology

## 2022-10-19 ENCOUNTER — Other Ambulatory Visit: Payer: Self-pay

## 2022-10-19 DIAGNOSIS — I10 Essential (primary) hypertension: Secondary | ICD-10-CM | POA: Diagnosis not present

## 2022-10-19 DIAGNOSIS — K565 Intestinal adhesions [bands], unspecified as to partial versus complete obstruction: Secondary | ICD-10-CM | POA: Diagnosis not present

## 2022-10-19 DIAGNOSIS — J449 Chronic obstructive pulmonary disease, unspecified: Secondary | ICD-10-CM | POA: Diagnosis not present

## 2022-10-19 DIAGNOSIS — G4733 Obstructive sleep apnea (adult) (pediatric): Secondary | ICD-10-CM

## 2022-10-19 DIAGNOSIS — K56609 Unspecified intestinal obstruction, unspecified as to partial versus complete obstruction: Secondary | ICD-10-CM | POA: Diagnosis not present

## 2022-10-19 HISTORY — PX: LAPAROSCOPY: SHX197

## 2022-10-19 LAB — BASIC METABOLIC PANEL
Anion gap: 13 (ref 5–15)
BUN: 19 mg/dL (ref 8–23)
CO2: 18 mmol/L — ABNORMAL LOW (ref 22–32)
Calcium: 8.2 mg/dL — ABNORMAL LOW (ref 8.9–10.3)
Chloride: 111 mmol/L (ref 98–111)
Creatinine, Ser: 0.8 mg/dL (ref 0.44–1.00)
GFR, Estimated: >60 mL/min (ref >60)
Glucose, Bld: 125 mg/dL — ABNORMAL HIGH (ref 70–99)
Potassium: 4.7 mmol/L (ref 3.5–5.1)
Sodium: 142 mmol/L (ref 135–145)

## 2022-10-19 LAB — TYPE AND SCREEN
ABO/RH(D): A NEG
Antibody Screen: NEGATIVE

## 2022-10-19 SURGERY — LAPAROSCOPY, DIAGNOSTIC
Anesthesia: General | Site: Abdomen

## 2022-10-19 MED ORDER — VERAPAMIL HCL ER 120 MG PO TBCR
120.0000 mg | EXTENDED_RELEASE_TABLET | Freq: Every day | ORAL | Status: DC
  Administered 2022-10-19 – 2022-10-21 (×3): 120 mg via ORAL
  Filled 2022-10-19 (×3): qty 1

## 2022-10-19 MED ORDER — BUPIVACAINE LIPOSOME 1.3 % IJ SUSP
INTRAMUSCULAR | Status: AC
  Filled 2022-10-19: qty 20

## 2022-10-19 MED ORDER — PROPOFOL 10 MG/ML IV BOLUS
INTRAVENOUS | Status: DC | PRN
Start: 2022-10-19 — End: 2022-10-19
  Administered 2022-10-19: 130 mg via INTRAVENOUS

## 2022-10-19 MED ORDER — ROCURONIUM BROMIDE 100 MG/10ML IV SOLN
INTRAVENOUS | Status: DC | PRN
  Administered 2022-10-19: 20 mg via INTRAVENOUS
  Administered 2022-10-19: 40 mg via INTRAVENOUS

## 2022-10-19 MED ORDER — PHENYLEPHRINE HCL (PRESSORS) 10 MG/ML IV SOLN
INTRAVENOUS | Status: AC
  Filled 2022-10-19: qty 1

## 2022-10-19 MED ORDER — HYDROMORPHONE HCL 2 MG/ML IJ SOLN
INTRAMUSCULAR | Status: AC
  Filled 2022-10-19: qty 1

## 2022-10-19 MED ORDER — FENTANYL CITRATE (PF) 100 MCG/2ML IJ SOLN
INTRAMUSCULAR | Status: DC | PRN
  Administered 2022-10-19: 50 ug via INTRAVENOUS
  Administered 2022-10-19: 150 ug via INTRAVENOUS

## 2022-10-19 MED ORDER — HYDROMORPHONE HCL 1 MG/ML IJ SOLN
INTRAMUSCULAR | Status: DC | PRN
  Administered 2022-10-19: .5 mg via INTRAVENOUS

## 2022-10-19 MED ORDER — FENTANYL CITRATE (PF) 250 MCG/5ML IJ SOLN
INTRAMUSCULAR | Status: AC
  Filled 2022-10-19: qty 5

## 2022-10-19 MED ORDER — DEXAMETHASONE SODIUM PHOSPHATE 10 MG/ML IJ SOLN
INTRAMUSCULAR | Status: DC | PRN
  Administered 2022-10-19: 4 mg via INTRAVENOUS

## 2022-10-19 MED ORDER — LIDOCAINE HCL (CARDIAC) PF 100 MG/5ML IV SOSY
PREFILLED_SYRINGE | INTRAVENOUS | Status: DC | PRN
  Administered 2022-10-19: 100 mg via INTRAVENOUS

## 2022-10-19 MED ORDER — DEXMEDETOMIDINE HCL IN NACL 80 MCG/20ML IV SOLN
INTRAVENOUS | Status: DC | PRN
  Administered 2022-10-19: 8 ug via INTRAVENOUS

## 2022-10-19 MED ORDER — PROMETHAZINE HCL 25 MG/ML IJ SOLN
INTRAMUSCULAR | Status: AC
  Administered 2022-10-19: 6.25 mg via INTRAVENOUS
  Filled 2022-10-19: qty 1

## 2022-10-19 MED ORDER — LIDOCAINE HCL (PF) 2 % IJ SOLN
INTRAMUSCULAR | Status: DC | PRN
  Administered 2022-10-19: 1.5 mg/kg/h via INTRADERMAL

## 2022-10-19 MED ORDER — EPHEDRINE 5 MG/ML INJ
INTRAVENOUS | Status: AC
  Filled 2022-10-19: qty 5

## 2022-10-19 MED ORDER — MORPHINE SULFATE (PF) 2 MG/ML IV SOLN: 1.0000 mg | INTRAVENOUS | Status: DC | PRN

## 2022-10-19 MED ORDER — CHLORHEXIDINE GLUCONATE 0.12 % MT SOLN
15.0000 mL | Freq: Once | OROMUCOSAL | Status: AC
  Administered 2022-10-19: 15 mL via OROMUCOSAL

## 2022-10-19 MED ORDER — SUCCINYLCHOLINE CHLORIDE 200 MG/10ML IV SOSY
PREFILLED_SYRINGE | INTRAVENOUS | Status: DC | PRN
  Administered 2022-10-19: 100 mg via INTRAVENOUS

## 2022-10-19 MED ORDER — PROPOFOL 10 MG/ML IV BOLUS
INTRAVENOUS | Status: AC
  Filled 2022-10-19: qty 20

## 2022-10-19 MED ORDER — GLYCOPYRROLATE 0.2 MG/ML IJ SOLN
INTRAMUSCULAR | Status: AC
  Filled 2022-10-19: qty 1

## 2022-10-19 MED ORDER — LACTATED RINGERS IV SOLN: INTRAVENOUS | Status: DC

## 2022-10-19 MED ORDER — LIDOCAINE HCL 2 % IJ SOLN
INTRAMUSCULAR | Status: AC
  Filled 2022-10-19: qty 20

## 2022-10-19 MED ORDER — ONDANSETRON HCL 4 MG/2ML IJ SOLN
INTRAMUSCULAR | Status: DC | PRN
  Administered 2022-10-19: 4 mg via INTRAVENOUS

## 2022-10-19 MED ORDER — ALBUMIN HUMAN 5 % IV SOLN
INTRAVENOUS | Status: AC
  Filled 2022-10-19: qty 250

## 2022-10-19 MED ORDER — PHENYLEPHRINE HCL-NACL 20-0.9 MG/250ML-% IV SOLN
INTRAVENOUS | Status: DC | PRN
  Administered 2022-10-19: 30 ug/min via INTRAVENOUS

## 2022-10-19 MED ORDER — EPHEDRINE SULFATE (PRESSORS) 50 MG/ML IJ SOLN
INTRAMUSCULAR | Status: DC | PRN
Start: 2022-10-19 — End: 2022-10-19
  Administered 2022-10-19: 10 mg via INTRAVENOUS

## 2022-10-19 MED ORDER — ROCURONIUM BROMIDE 10 MG/ML (PF) SYRINGE
PREFILLED_SYRINGE | INTRAVENOUS | Status: AC
  Filled 2022-10-19: qty 10

## 2022-10-19 MED ORDER — ONDANSETRON HCL 4 MG/2ML IJ SOLN: 4.0000 mg | Freq: Once | INTRAMUSCULAR | Status: DC | PRN

## 2022-10-19 MED ORDER — DEXAMETHASONE SODIUM PHOSPHATE 10 MG/ML IJ SOLN
INTRAMUSCULAR | Status: AC
  Filled 2022-10-19: qty 1

## 2022-10-19 MED ORDER — ACETAMINOPHEN 10 MG/ML IV SOLN: 1000.0000 mg | Freq: Once | INTRAVENOUS | Status: DC | PRN

## 2022-10-19 MED ORDER — CEFAZOLIN SODIUM-DEXTROSE 2-4 GM/100ML-% IV SOLN
2.0000 g | Freq: Once | INTRAVENOUS | Status: AC
  Administered 2022-10-19: 2 g via INTRAVENOUS
  Filled 2022-10-19: qty 100

## 2022-10-19 MED ORDER — ALBUMIN HUMAN 5 % IV SOLN: INTRAVENOUS | Status: DC | PRN

## 2022-10-19 MED ORDER — LIDOCAINE HCL (PF) 2 % IJ SOLN
INTRAMUSCULAR | Status: AC
  Filled 2022-10-19: qty 5

## 2022-10-19 MED ORDER — ONDANSETRON HCL 4 MG/2ML IJ SOLN
INTRAMUSCULAR | Status: AC
  Filled 2022-10-19: qty 2

## 2022-10-19 MED ORDER — 0.9 % SODIUM CHLORIDE (POUR BTL) OPTIME
TOPICAL | Status: DC | PRN
  Administered 2022-10-19: 1000 mL

## 2022-10-19 MED ORDER — BUPIVACAINE-EPINEPHRINE (PF) 0.25% -1:200000 IJ SOLN
INTRAMUSCULAR | Status: DC | PRN
  Administered 2022-10-19: 50 mL

## 2022-10-19 MED ORDER — SUGAMMADEX SODIUM 200 MG/2ML IV SOLN
INTRAVENOUS | Status: DC | PRN
  Administered 2022-10-19: 200 mg via INTRAVENOUS

## 2022-10-19 MED ORDER — FENTANYL CITRATE PF 50 MCG/ML IJ SOSY: 25.0000 ug | PREFILLED_SYRINGE | INTRAMUSCULAR | Status: DC | PRN

## 2022-10-19 MED ORDER — BUPIVACAINE-EPINEPHRINE 0.25% -1:200000 IJ SOLN
INTRAMUSCULAR | Status: AC
  Filled 2022-10-19: qty 1

## 2022-10-19 MED ORDER — PROMETHAZINE HCL 25 MG/ML IJ SOLN: 6.2500 mg | INTRAMUSCULAR | Status: DC | PRN

## 2022-10-19 SURGICAL SUPPLY — 48 items
ADH SKN CLS APL DERMABOND .7 (GAUZE/BANDAGES/DRESSINGS)
APPLIER CLIP 5 13 M/L LIGAMAX5 (MISCELLANEOUS)
APPLIER CLIP ROT 10 11.4 M/L (STAPLE)
APR CLP MED LRG 11.4X10 (STAPLE)
APR CLP MED LRG 5 ANG JAW (MISCELLANEOUS)
BAG COUNTER SPONGE SURGICOUNT (BAG) IMPLANT
BAG SPNG CNTER NS LX DISP (BAG)
BLADE EXTENDED COATED 6.5IN (ELECTRODE) IMPLANT
CLIP APPLIE 5 13 M/L LIGAMAX5 (MISCELLANEOUS) IMPLANT
CLIP APPLIE ROT 10 11.4 M/L (STAPLE) IMPLANT
COVER MAYO STAND STRL (DRAPES) ×1 IMPLANT
DERMABOND ADVANCED .7 DNX12 (GAUZE/BANDAGES/DRESSINGS) IMPLANT
ELECT REM PT RETURN 15FT ADLT (MISCELLANEOUS) ×1 IMPLANT
GAUZE SPONGE 4X4 12PLY STRL (GAUZE/BANDAGES/DRESSINGS) ×1 IMPLANT
GLOVE BIO SURGEON STRL SZ 6.5 (GLOVE) ×2 IMPLANT
GLOVE INDICATOR 6.5 STRL GRN (GLOVE) ×2 IMPLANT
GOWN STRL REUS W/ TWL XL LVL3 (GOWN DISPOSABLE) ×3 IMPLANT
GOWN STRL REUS W/TWL XL LVL3 (GOWN DISPOSABLE) ×3
HANDLE SUCTION POOLE (INSTRUMENTS) IMPLANT
IRRIG SUCT STRYKERFLOW 2 WTIP (MISCELLANEOUS)
IRRIGATION SUCT STRKRFLW 2 WTP (MISCELLANEOUS) ×1 IMPLANT
KIT TURNOVER KIT A (KITS) IMPLANT
PACK COLON (CUSTOM PROCEDURE TRAY) ×1 IMPLANT
SEALER TISSUE G2 STRG ARTC 35C (ENDOMECHANICALS) IMPLANT
SET TUBE SMOKE EVAC HIGH FLOW (TUBING) ×1 IMPLANT
SLEEVE ADV FIXATION 5X100MM (TROCAR) ×3 IMPLANT
SPIKE FLUID TRANSFER (MISCELLANEOUS) ×1 IMPLANT
STAPLER VISISTAT 35W (STAPLE) IMPLANT
SUCTION POOLE HANDLE (INSTRUMENTS) ×1
SUT NOVA 1 T20/GS 25DT (SUTURE) IMPLANT
SUT PDS AB 1 CT1 27 (SUTURE) ×2 IMPLANT
SUT PDS AB 1 TP1 96 (SUTURE) IMPLANT
SUT PROLENE 2 0 KS (SUTURE) IMPLANT
SUT PROLENE 2 0 SH DA (SUTURE) IMPLANT
SUT SILK 2 0 (SUTURE) ×1
SUT SILK 2 0 SH CR/8 (SUTURE) ×1 IMPLANT
SUT SILK 2-0 18XBRD TIE 12 (SUTURE) ×1 IMPLANT
SUT SILK 3 0 (SUTURE)
SUT SILK 3 0 SH CR/8 (SUTURE) ×1 IMPLANT
SUT SILK 3-0 18XBRD TIE 12 (SUTURE) ×1 IMPLANT
TOWEL OR 17X26 10 PK STRL BLUE (TOWEL DISPOSABLE) IMPLANT
TOWEL OR NON WOVEN STRL DISP B (DISPOSABLE) ×1 IMPLANT
TRAY FOLEY MTR SLVR 16FR STAT (SET/KITS/TRAYS/PACK) ×1 IMPLANT
TRAY LAPAROSCOPIC (CUSTOM PROCEDURE TRAY) IMPLANT
TROCAR 11X100 Z THREAD (TROCAR) IMPLANT
TROCAR ADV FIXATION 5X100MM (TROCAR) ×1 IMPLANT
TROCAR BALLN 12MMX100 BLUNT (TROCAR) IMPLANT
TUBING CONNECTING 10 (TUBING) ×2 IMPLANT

## 2022-10-19 NOTE — Transfer of Care (Signed)
Immediate Anesthesia Transfer of Care Note  Patient: Kirsten [name]  Procedure(s) Performed: LAPAROSCOPY DIAGNOSTIC WITH LYSIS OF ADHESIONS (Abdomen)  Patient Location: PACU  Anesthesia Type:General  Level of Consciousness: oriented, drowsy, and patient cooperative  Airway & Oxygen Therapy: Patient Spontanous Breathing and Patient connected to face mask oxygen  Post-op Assessment: Report given to RN and Post -op Vital signs reviewed and stable  Post vital signs: Reviewed and stable  Last Vitals:  Vitals Value Taken Time  BP 158/65 10/19/22 1300  Temp    Pulse 92 10/19/22 1303  Resp 15 10/19/22 1303  SpO2 100 % 10/19/22 1303  Vitals shown include unfiled device data.  Last Pain:  Vitals:   10/19/22 1010  TempSrc: Oral  PainSc: 0-No pain      Patients Stated Pain Goal: 3 (10/18/22 1707)  Complications: No notable events documented.

## 2022-10-19 NOTE — Plan of Care (Signed)

## 2022-10-19 NOTE — Progress Notes (Signed)
   10/19/22 1009  OBSTRUCTIVE SLEEP APNEA  Have you ever been diagnosed with sleep apnea through a sleep study? Yes  If yes, do you have and use a CPAP or BPAP machine every night? 0 (wears dental device)  Do you snore loudly (loud enough to be heard through closed doors)?  0  Do you often feel tired, fatigued, or sleepy during the daytime (such as falling asleep during driving or talking to someone)? 0  Has anyone observed you stop breathing during your sleep? 0  Do you have, or are you being treated for high blood pressure? 1  BMI more than 35 kg/m2? 0  Age > 50 (1-yes) 1  Neck circumference greater than:Female 16 inches or larger, Female 17inches or larger? 0  Female Gender (Yes=1) 0  Obstructive Sleep Apnea Score 2   Patient has sleep doctor who manages

## 2022-10-19 NOTE — Progress Notes (Signed)
CCC Pre-op Review  Pre-op checklist: Asked RN to comple Patient has brushed her teeth, completed CHG bath  NPO: has been since MN  Labs: has recent labs  Consent: Order in [facility], completed per RN  H&P: 8/24, consult note 8/25  Vitals: stable  O2 requirements: no  MAR/PTA review: Heparin 5000 units given at 0530  IV: 20G R upper arm  Floor nurse name:  Misty Stanley  Additional info:  Patient has NG tube

## 2022-10-19 NOTE — Anesthesia Postprocedure Evaluation (Signed)
Anesthesia Post Note  Patient: Kirsten [name]  Procedure(s) Performed: LAPAROSCOPY DIAGNOSTIC WITH LYSIS OF ADHESIONS (Abdomen)     Patient location during evaluation: PACU Anesthesia Type: General Level of consciousness: sedated Pain management: pain level controlled Vital Signs Assessment: post-procedure vital signs reviewed and stable Respiratory status: spontaneous breathing Cardiovascular status: stable Postop Assessment: no headache Anesthesia complication: PONV.  No notable events documented.  Last Vitals:  Vitals:   10/19/22 1330 10/19/22 1345  BP: (!) 151/61 (!) 153/79  Pulse: 83 89  Resp: 16 15  Temp:    SpO2: 100% 95%    Last Pain:  Vitals:   10/19/22 1345  TempSrc:   PainSc: Asleep                 Caren Macadam

## 2022-10-19 NOTE — Op Note (Addendum)
10/16/2022 - 10/19/2022  12:39 PM  PATIENT:  Kirsten [name]  82 y.o. female  Patient Care Team: [name]., MD as PCP - General (Internal Medicine) Marinus Maw, MD as PCP - Cardiology (Cardiology) Marinus Maw, MD as PCP - Electrophysiology (Cardiology)  PRE-OPERATIVE DIAGNOSIS:  SMALL BOWEL OBSTRUCTION  POST-OPERATIVE DIAGNOSIS: SBO DUE TO ADHESIONS  PROCEDURE:  LAPAROSCOPY DIAGNOSTIC WITH LYSIS OF ADHESIONS for 40 MINS    Surgeon(s): [name], MD  ASSISTANT: Adam Phenix, PA-C   ANESTHESIA:   local and general  EBL:77ml  Total I/O In: -  Out: 100 [Urine:70; Blood:30]  DRAINS: none   SPECIMEN:  No Specimen  DISPOSITION OF SPECIMEN:  N/A  COUNTS:  YES  PLAN OF CARE:  Patient already admitted  PATIENT DISPOSITION:  PACU - hemodynamically stable.  INDICATION: 82 y.o. F with recurrent SBO's.  I recommended laparoscopic evaluation with lysis of adhesions   OR FINDINGS: Dense inflammatory adhesions in the right lower quadrant  DESCRIPTION: the patient was identified in the preoperative holding area and taken to the OR where they were laid supine on the operating room table.  General anesthesia was induced without difficulty. SCDs were also noted to be in place prior to the initiation of anesthesia.  The patient was then prepped and draped in the usual sterile fashion.   A surgical timeout was performed indicating the correct patient, procedure, positioning and need for preoperative antibiotics.   I began by making a supraumbilical incision using a 15 blade scalpel.  This was carried down through the subcutaneous tissues and the fascia was elevated and incised with a scalpel.  I confirmed entry bluntly with a Kelly clamp.  There was no sign of injury.  A Hassan port was placed and secured with a 0 Vicryl pursestring suture.  I then used the camera to insert 5 mm ports in the left lower and left upper quadrants under direct visualization.  There was  a small amount of omentum adherent at the umbilicus.  This was taken down sharply.  There was a series of dense adhesive bands in the right lower quadrant causing quite a bit of obstruction due to matting of the small bowel.  I began by lysing adhesions using the laparoscopic scissors.  I continued to work towards the terminal ileum making sure to visualize the small bowel well to avoid injury.  Once I was able to free the terminal ileum from the pelvis I then began to run this proximally.  I lysed adhesions with the scissors as I went.  I continued until approximately the ileojejunal junction.  There was a tight area with 2 adherent loops of small bowel.  This appeared to be the point of obstruction.  The dense adhesion was transected to allow these 2 pieces of bowel to be freed.  I then continued my dissection proximally inspecting the small bowel and lysing adhesions.  There was 1 adhesive band to the left lower quadrant abdominal wall which was transected also.  There was no other adhesive issues in the left upper quadrant.  I then ran the terminal ileum again to inspect for any injuries due to adhesion lysis.  None were noted.  There was no active bleeding.  The abdomen was irrigated with 1 L of warm normal saline.  Hemostasis was good.  The camera was not removed and the abdomen was desufflated.  The ports were removed.  The fascia was closed with the 0 Vicryl pursestring suture.  The remaining port sites were closed with 4-0 Vicryl subcuticular suture and Dermabond.  Patient was then awakened from anesthesia and sent to the postanesthesia care unit in stable condition.  All counts were correct per operating room staff.  Vanita Panda, MD  Colorectal and General Surgery [facility]

## 2022-10-19 NOTE — Progress Notes (Signed)
SBO (small bowel obstruction) (HCC)  Subjective: Pt starting to have some bowel function  Objective: Vital signs in last 24 hours: Temp:  [97.8 F (36.6 C)-98.4 F (36.9 C)] 98 F (36.7 C) (08/27 0815) Pulse Rate:  [70-89] 70 (08/27 0815) Resp:  [18-20] 20 (08/27 0815) BP: (127-153)/(61-77) 153/69 (08/27 0815) SpO2:  [93 %-95 %] 93 % (08/27 0815) Last BM Date : 10/16/22  Intake/Output from previous day: 08/26 0701 - 08/27 0700 In: 511.6 [I.V.:511.6] Out: 250 [Emesis/NG output:250] Intake/Output this shift: No intake/output data recorded.  General appearance: alert and cooperative GI: soft, no distention NG with bilious secretions Lab Results:  Results for orders placed or performed during the hospital encounter of 10/16/22 (from the past 24 hour(s))  Type and screen [facility]     Status: None (Preliminary result)   Collection Time: 10/19/22  8:49 AM  Result Value Ref Range   ABO/RH(D) PENDING    Antibody Screen PENDING    Sample Expiration      10/22/2022,2359 Performed at [facility], [address], [place], [place] [address]      Studies/Results Radiology     MEDS, Scheduled  [MAR Hold] heparin  5,000 Units Subcutaneous Q8H   [MAR Hold] levothyroxine  50 mcg Intravenous Daily   [MAR Hold] verapamil  120 mg Oral QHS     Assessment: SBO (small bowel obstruction) (HCC) recurrent    Plan:  Surgery discussed with patient today.  She is having some bowel function.  We discussed that her early recurrence is a big risk factor for further recurrences.  I think it is still reasonable to proceed with surgery.  We discussed the risks of bleeding, infection, additional scar tissue that could cause recurrence and prolonged recovery.  All questions answered.  She agrees to proceed with surgery.   LOS: 2 days    Vanita Panda, MD [facility], [place]  Patient's medical decision making was moderate     10/19/2022 10:05 AM

## 2022-10-19 NOTE — Plan of Care (Signed)
  Problem: Education: Goal: Knowledge of General Education information will improve Description: Including pain rating scale, medication(s)/side effects and non-pharmacologic comfort measures Outcome: Not Progressing   Problem: Health Behavior/Discharge Planning: Goal: Ability to manage health-related needs will improve Outcome: Not Progressing   

## 2022-10-19 NOTE — Anesthesia Procedure Notes (Signed)
Procedure Name: Intubation Date/Time: 10/19/2022 11:22 AM  Performed by: Garth Bigness, CRNAPre-anesthesia Checklist: Patient identified, Emergency Drugs available, Suction available and Patient being monitored Patient Re-evaluated:Patient Re-evaluated prior to induction Oxygen Delivery Method: Circle system utilized Preoxygenation: Pre-oxygenation with 100% oxygen Induction Type: IV induction Ventilation: Mask ventilation without difficulty Laryngoscope Size: Mac and 3 Grade View: Grade I Tube type: Oral Tube size: 7.0 mm Number of attempts: 1 Airway Equipment and Method: Stylet Placement Confirmation: ETT inserted through vocal cords under direct vision, positive ETCO2 and breath sounds checked- equal and bilateral (ett by emt student) Secured at: 22 cm Tube secured with: Tape Dental Injury: Teeth and Oropharynx as per pre-operative assessment

## 2022-10-19 NOTE — Progress Notes (Signed)
PT Cancellation Note  Patient Details Name: Kirsten [name] MRN: [id number] DOB: December 10, 1940   Cancelled Treatment:    Reason Eval/Treat Not Completed: Patient at procedure or test/unavailable. Surgery today. Will check back another day to resume PT.    Faye Ramsay, PT Acute Rehabilitation  Office: [phone]

## 2022-10-19 NOTE — Progress Notes (Signed)
[facility] PROGRESS NOTE    Progress Note  Kirsten [name]  WUJ:[id number] DOB: Aug 27, 1940 DOA: 10/16/2022 PCP: [name]., MD     Brief Narrative:   Kirsten [name] is an 82 y.o. female past medical history significant for COPD, essential hypertension who underwent sacral plexi on October 2023, postoperatively she developed SBO and discharged on 10/06/2022 treated conservatively.  In the morning of admission started developing again abdominal pain came into the ER CT scan showed SBO.  Assessment/Plan:   SBO (small bowel obstruction) [facility]): Surgery rescheduled for today 10/19/2022. Currently on IV fluids, NG tube to intermittent suction. General surgery has been recommended laparoscopic surgery. Try to keep potassium greater than 4 magnesium greater than 2.  Basic metabolic panels pending this morning. Out of bed to chair consult physical therapy. Patient relates she is passing gas and had a small bowel movement abdominal exam is benign  Essential hypertension: Blood pressure stable continue to hold all oral meds.  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  History of SVT: Continue flecainide (as needed), verapamil and aspirin. Continue to monitor.  Hypothyroidism: Hold oral Synthroid, continue IV Synthroid.  Essential tremor: Hold oral medication.   DVT prophylaxis: lovenox Family Communication:none Status is: Observation The patient will require care spanning > 2 midnights and should be moved to inpatient because: SBO    Code Status:     Code Status Orders  (From admission, onward)           Start     Ordered   10/16/22 2335  Do not attempt resuscitation (DNR)  Continuous       Question Answer Comment  If patient has no pulse and is not breathing Do Not Attempt Resuscitation   If patient has a pulse and/or is breathing: Medical Treatment Goals COMFORT MEASURES: Keep clean/warm/dry, use medication by any route; positioning, wound care  and other measures to relieve pain/suffering; use oxygen, suction/manual treatment of airway obstruction for comfort; do not transfer unless for comfort needs.   Consent: Discussion documented in EHR or advanced directives reviewed      10/16/22 2335           Code Status History     Date Active Date Inactive Code Status Order ID Comments User Context   10/16/2022 2335 10/16/2022 2335 Full Code [id number]  Gery Pray, MD ED   10/03/2022 2220 10/06/2022 1628 DNR [id number]  Reva Bores, MD ED      Advance Directive Documentation    Flowsheet Row Most Recent Value  Type of Advance Directive Healthcare Power of Attorney, Living will  Pre-existing out of facility DNR order (yellow form or pink MOST form) --  "MOST" Form in Place? --         IV Access:   Peripheral IV   Procedures and diagnostic studies:   DG Abd Portable 1V-Small Bowel Obstruction Protocol-initial, 8 hr delay  Result Date: 10/17/2022 CLINICAL DATA:  8 hour follow-up small-bowel film EXAM: PORTABLE ABDOMEN - 1 VIEW COMPARISON:  Film from earlier in the same day. FINDINGS: Mild scattered large and small bowel gas is noted. Previously administered contrast lies within the stomach as well as in the dilated loops of small bowel. No definitive colonic contrast is noted at this time. No bony abnormality is seen. No free air is noted. IMPRESSION: No colonic contrast is noted at this time. 24 hour follow-up film is recommended. Electronically Signed   By: Alcide Clever M.D.   On:  10/17/2022 20:35     Medical Consultants:   None.   Subjective:    Kirsten [name] pain is controlled she says she is passing gas had a small bowel movement.  Objective:    Vitals:   10/18/22 1413 10/18/22 2035 10/19/22 0540 10/19/22 0815  BP:  (!) 144/64 (!) 151/61 (!) 153/69  Pulse:  85 70 70  Resp:  18 18 20   Temp:  98.1 F (36.7 C) 97.8 F (36.6 C) 98 F (36.7 C)  TempSrc:  Oral Oral Oral  SpO2: 95% 93% 93% 93%   Weight:      Height:       SpO2: 93 %   Intake/Output Summary (Last 24 hours) at 10/19/2022 0921 Last data filed at 10/19/2022 0200 Gross per 24 hour  Intake 511.64 ml  Output 250 ml  Net 261.64 ml   Filed Weights   10/17/22 0213  Weight: 49.6 kg    Exam: General exam: In no acute distress. Respiratory system: Good air movement and clear to auscultation. Cardiovascular system: S1 & S2 heard, RRR. No JVD. Gastrointestinal system: Abdomen is nondistended, soft and nontender.  Extremities: No pedal edema. Skin: No rashes, lesions or ulcers Psychiatry: Judgement and insight appear normal. Mood & affect appropriate. Data Reviewed:    Labs: Basic Metabolic Panel: Recent Labs  Lab 10/16/22 1950 10/17/22 0338 10/18/22 0422  NA 140 139 139  K 3.6 3.8 4.1  CL 100 103 109  CO2 27 27 23   GLUCOSE 124* 117* 79  BUN 28* 22 19  CREATININE 0.96 0.82 0.73  CALCIUM 9.5 8.6* 8.0*  MG  --  1.9  --    GFR Estimated Creatinine Clearance: 39.3 mL/min (by C-G formula based on SCr of 0.73 mg/dL). Liver Function Tests: Recent Labs  Lab 10/16/22 1950  AST 21  ALT 18  ALKPHOS 83  BILITOT 0.5  PROT 7.8  ALBUMIN 4.1   Recent Labs  Lab 10/16/22 1950  LIPASE 31   No results for input(s): "AMMONIA" in the last 168 hours. Coagulation profile No results for input(s): "INR", "PROTIME" in the last 168 hours. COVID-19 Labs  No results for input(s): "DDIMER", "FERRITIN", "LDH", "CRP" in the last 72 hours.  No results found for: "SARSCOV2NAA"  CBC: Recent Labs  Lab 10/16/22 1950 10/17/22 0338  WBC 20.2* 12.4*  NEUTROABS  --  10.3*  HGB 14.9 13.8  HCT 46.0 44.6  MCV 88.3 89.0  PLT 372 330   Cardiac Enzymes: No results for input(s): "CKTOTAL", "CKMB", "CKMBINDEX", "TROPONINI" in the last 168 hours. BNP (last 3 results) No results for input(s): "PROBNP" in the last 8760 hours. CBG: No results for input(s): "GLUCAP" in the last 168 hours. D-Dimer: No results for  input(s): "DDIMER" in the last 72 hours. Hgb A1c: No results for input(s): "HGBA1C" in the last 72 hours. Lipid Profile: No results for input(s): "CHOL", "HDL", "LDLCALC", "TRIG", "CHOLHDL", "LDLDIRECT" in the last 72 hours. Thyroid function studies: No results for input(s): "TSH", "T4TOTAL", "T3FREE", "THYROIDAB" in the last 72 hours.  Invalid input(s): "FREET3" Anemia work up: No results for input(s): "VITAMINB12", "FOLATE", "FERRITIN", "TIBC", "IRON", "RETICCTPCT" in the last 72 hours. Sepsis Labs: Recent Labs  Lab 10/16/22 1950 10/17/22 0338  WBC 20.2* 12.4*   Microbiology No results found for this or any previous visit (from the past 240 hour(s)).   Medications:    heparin  5,000 Units Subcutaneous Q8H   [START ON 10/22/2022] levothyroxine  50 mcg Intravenous Daily  Continuous Infusions:  0.9 % NaCl with KCl 20 mEq / L 75 mL/hr at 10/18/22 1857      LOS: 2 days   Kirsten [name]  [facility]  10/19/2022, 9:21 AM

## 2022-10-20 ENCOUNTER — Encounter (HOSPITAL_COMMUNITY): Payer: Self-pay | Admitting: General Surgery

## 2022-10-20 DIAGNOSIS — K56609 Unspecified intestinal obstruction, unspecified as to partial versus complete obstruction: Secondary | ICD-10-CM | POA: Diagnosis not present

## 2022-10-20 DIAGNOSIS — J4489 Other specified chronic obstructive pulmonary disease: Secondary | ICD-10-CM | POA: Diagnosis not present

## 2022-10-20 DIAGNOSIS — I10 Essential (primary) hypertension: Secondary | ICD-10-CM | POA: Diagnosis not present

## 2022-10-20 LAB — CBC
HCT: 39.3 % (ref 36.0–46.0)
Hemoglobin: 11.6 g/dL — ABNORMAL LOW (ref 12.0–15.0)
MCH: 28.2 pg (ref 26.0–34.0)
MCHC: 29.5 g/dL — ABNORMAL LOW (ref 30.0–36.0)
MCV: 95.6 fL (ref 80.0–100.0)
Platelets: 202 10*3/uL (ref 150–400)
RBC: 4.11 MIL/uL (ref 3.87–5.11)
RDW: 15.6 % — ABNORMAL HIGH (ref 11.5–15.5)
WBC: 11 10*3/uL — ABNORMAL HIGH (ref 4.0–10.5)
nRBC: 0 % (ref 0.0–0.2)

## 2022-10-20 LAB — BASIC METABOLIC PANEL
Anion gap: 13 (ref 5–15)
BUN: 19 mg/dL (ref 8–23)
CO2: 19 mmol/L — ABNORMAL LOW (ref 22–32)
Calcium: 8.7 mg/dL — ABNORMAL LOW (ref 8.9–10.3)
Chloride: 112 mmol/L — ABNORMAL HIGH (ref 98–111)
Creatinine, Ser: 0.62 mg/dL (ref 0.44–1.00)
GFR, Estimated: >60 mL/min (ref >60)
Glucose, Bld: 86 mg/dL (ref 70–99)
Potassium: 4.9 mmol/L (ref 3.5–5.1)
Sodium: 144 mmol/L (ref 135–145)

## 2022-10-20 MED ORDER — LEVOTHYROXINE SODIUM 50 MCG PO TABS
75.0000 ug | ORAL_TABLET | Freq: Every day | ORAL | Status: DC
  Administered 2022-10-21 – 2022-10-22 (×2): 75 ug via ORAL
  Filled 2022-10-20 (×2): qty 1

## 2022-10-20 MED ORDER — PRIMIDONE 50 MG PO TABS
50.0000 mg | ORAL_TABLET | Freq: Every day | ORAL | Status: DC
  Administered 2022-10-20 – 2022-10-21 (×2): 50 mg via ORAL
  Filled 2022-10-20 (×2): qty 1

## 2022-10-20 MED ORDER — ROSUVASTATIN CALCIUM 10 MG PO TABS
10.0000 mg | ORAL_TABLET | Freq: Every day | ORAL | Status: DC
  Administered 2022-10-20 – 2022-10-22 (×3): 10 mg via ORAL
  Filled 2022-10-20 (×3): qty 1

## 2022-10-20 MED ORDER — LABETALOL HCL 5 MG/ML IV SOLN
10.0000 mg | INTRAVENOUS | Status: DC | PRN
  Administered 2022-10-21: 10 mg via INTRAVENOUS
  Filled 2022-10-20: qty 4

## 2022-10-20 MED ORDER — UMECLIDINIUM BROMIDE 62.5 MCG/ACT IN AEPB
1.0000 | INHALATION_SPRAY | Freq: Every day | RESPIRATORY_TRACT | Status: DC
  Filled 2022-10-20: qty 7

## 2022-10-20 MED ORDER — PANTOPRAZOLE SODIUM 40 MG IV SOLR
40.0000 mg | INTRAVENOUS | Status: DC
  Administered 2022-10-20 – 2022-10-21 (×2): 40 mg via INTRAVENOUS
  Filled 2022-10-20 (×2): qty 10

## 2022-10-20 NOTE — Progress Notes (Signed)
Mobility Specialist - Progress Note   10/20/22 1027  Mobility  Activity Ambulated independently in hallway  Level of Assistance Standby assist, set-up cues, supervision of patient - no hands on  Assistive Device None  Distance Ambulated (ft) 350 ft  Range of Motion/Exercises Active  Activity Response Tolerated well  Mobility Referral Yes  $Mobility charge 1 Mobility  Mobility Specialist Start Time (ACUTE ONLY) 1015  Mobility Specialist Stop Time (ACUTE ONLY) 1027  Mobility Specialist Time Calculation (min) (ACUTE ONLY) 12 min   Pt was found in bed wanting to ambulate. No complaints with session. At EOS returned to bed with all needs met. Family in room and all needs met.   Billey Chang Mobility Specialist

## 2022-10-20 NOTE — Progress Notes (Signed)
Physical Therapy Treatment Patient Details Name: Kirsten [name] MRN: [id number] DOB: 02-23-1940 Today's Date: 10/20/2022   History of Present Illness Pt admitted from home 2* SBO.  Pt with hx of COPD, tremors, htn, Hypothyroid, tinnituus and Vertigo    PT Comments   Pt admitted with above diagnosis.  Pt currently with functional limitations due to the deficits listed below (see PT Problem List). Pt is POD 1 s/p laparoscopic lysis of adhesions secondary to SBO. Pt indicates mild abdominal pain and pain associated with placement of NG tube. Pt is eager to d/c home and at this time no PT HH needs. Pt is mod I with bed mobility, mod I for transfers and S for gait tasks with good reciprocal pattern and no evidence of instability without AD for 500 feet. Pt elected to return to bed, all needs in place and friend present.  Pt will benefit from acute skilled PT to increase their independence and safety with mobility to allow discharge.      If plan is discharge home, recommend the following: Assist for transportation;A little help with walking and/or transfers;Assistance with cooking/housework   Can travel by private vehicle        Equipment Recommendations  None recommended by PT (Pt states not interested in using RW here or at home nor having HH services)    Recommendations for Other Services       Precautions / Restrictions Precautions Precautions: Fall Precaution Comments: NG tube Restrictions Weight Bearing Restrictions: No     Mobility  Bed Mobility Overal bed mobility: Modified Independent             General bed mobility comments: supine <>sit unassisted    Transfers Overall transfer level: Modified independent Equipment used: Rolling walker (2 wheels), None Transfers: Sit to/from Stand Sit to Stand: Supervision, Modified independent (Device/Increase time)           General transfer comment: no cues required. sit to stand from EOB no AD and no instabiltiy noted     Ambulation/Gait Ambulation/Gait assistance: Supervision Gait Distance (Feet): 500 Feet Assistive device: None Gait Pattern/deviations: Step-through pattern       General Gait Details: no noted instabilty, no reports of fatigue or SOB with gait taks   Stairs             Wheelchair Mobility     Tilt Bed    Modified Rankin (Stroke Patients Only)       Balance Overall balance assessment: Needs assistance Sitting-balance support: No upper extremity supported, Feet supported Sitting balance-Leahy Scale: Good     Standing balance support: No upper extremity supported Standing balance-Leahy Scale: Good                              Cognition Arousal: Alert Behavior During Therapy: WFL for tasks assessed/performed, Anxious Overall Cognitive Status: Within Functional Limits for tasks assessed                                          Exercises      General Comments        Pertinent Vitals/Pain Pain Assessment Pain Assessment: 0-10 Pain Score: 5  Pain Location: abdomen and NGT placement Pain Descriptors / Indicators: Sore, Discomfort Pain Intervention(s): Limited activity within patient's tolerance, Monitored during session    Home Living Family/patient expects  to be discharged to:: Private residence Living Arrangements: Alone Available Help at Discharge: Family;Friend(s);Available 24 hours/day Type of Home: House Home Access: Level entry       Home Layout: Two level;Able to live on main level with bedroom/bathroom Home Equipment: Gilmer Mor - single point      Prior Function            PT Goals (current goals can now be found in the care plan section) Acute Rehab PT Goals Patient Stated Goal: Regain IND and return home PT Goal Formulation: With patient Time For Goal Achievement: 10/31/22 Potential to Achieve Goals: Good Progress towards PT goals: Progressing toward goals    Frequency    Min 1X/week      PT  Plan      Co-evaluation              AM-PAC PT "6 Clicks" Mobility   Outcome Measure  Help needed turning from your back to your side while in a flat bed without using bedrails?: None Help needed moving from lying on your back to sitting on the side of a flat bed without using bedrails?: None Help needed moving to and from a bed to a chair (including a wheelchair)?: None Help needed standing up from a chair using your arms (e.g., wheelchair or bedside chair)?: None Help needed to walk in hospital room?: A Little Help needed climbing 3-5 steps with a railing? : A Little 6 Click Score: 22    End of Session Equipment Utilized During Treatment: Gait belt Activity Tolerance: Patient tolerated treatment well Patient left: in bed;with call bell/phone within reach;with family/visitor present Nurse Communication: Mobility status PT Visit Diagnosis: Unsteadiness on feet (R26.81)     Time: 4403-4742 PT Time Calculation (min) (ACUTE ONLY): 16 min  Charges:    $Gait Training: 8-22 mins PT General Charges $$ ACUTE PT VISIT: 1 Visit                     Kirsten [name], PT Acute Rehab    [name] 10/20/2022, 12:05 PM

## 2022-10-20 NOTE — Progress Notes (Signed)
PROGRESS NOTE  Kirsten [name] ZOX:[id number] DOB: [birth year]   PCP: Kirsten [name]., MD  Patient is from: Home.  DOA: 10/16/2022 LOS: 3  Chief complaints Chief Complaint  Patient presents with   Abdominal Pain     Brief Narrative / Interim history: 82 year old F with PMH of COPD, HTN, hypothyroidism, tremor, sacral plexi in 11/2021 at [facility] complicated by prolonged bowel obstruction requiring surgical repair, and recent hospitalization from 8/11-8/14 for SBO returning with abdominal pain and found to have small bowel obstruction.  General surgery consulted.  Patient underwent diagnostic laparoscopy with lysis of adhesion by Dr. Maisie Fus on 8/27.    Subjective: Seen and examined earlier this morning.  No major events overnight of this morning.  No complaints.  Reports improvement in her pain.  She rates the pain 5-6 on the scale of 10.  Reports passing gas.  Denies nausea or vomiting.  NG tube clamped.  Objective: Vitals:   10/19/22 1426 10/19/22 1833 10/19/22 2133 10/20/22 0228  BP: (!) 162/74 131/77 (!) 149/68 (!) 140/70  Pulse: 84 78 71 63  Resp: 17 18 17 18   Temp: 97.9 F (36.6 C) 98 F (36.7 C) 98.4 F (36.9 C) 97.6 F (36.4 C)  TempSrc: Oral Oral Oral Oral  SpO2: 97% 93% 95% 94%  Weight:      Height:        Examination:  GENERAL: No apparent distress.  Nontoxic. HEENT: MMM.  Vision and hearing grossly intact.  NGT in place. NECK: Supple.  No apparent JVD.  RESP:  No IWOB.  Fair aeration bilaterally. CVS:  RRR. Heart sounds normal.  ABD/GI/GU: BS+. Abd soft.  Appropriately tender. MSK/EXT:  Moves extremities. No apparent deformity. No edema.  SKIN: no apparent skin lesion or wound NEURO: Awake, alert and oriented appropriately.  No apparent focal neuro deficit. PSYCH: Calm. Normal affect.   Procedures:  8/27-diagnostic laparoscopy with lysis of adhesion by Dr. Maisie Fus  Microbiology summarized: None  Assessment and plan: Principal Problem:   SBO (small  bowel obstruction) (HCC) Active Problems:   Hypertension   COPD with asthma  Recurrent small bowel obstruction:  -S/p diagnostic laparoscopy with lysis of adhesion by Dr. Maisie Fus on 8/27 -NG tube clamped.  Started ice chips and sips of clears -Pain much Ativan per surgery. -Continue IV fluid -Encourage ambulation.   Essential hypertension: BP within acceptable range. -Continue home verapamil -Continue holding HCTZ -IV labetalol as needed   COPD with asthma: Stable -Continue Spiriva and inhalers.   History of SVT: On flecainide as needed. -Continue verapamil and aspirin. -Will resume flecainide if needed   Hypothyroidism: -Resume home Synthroid.   Essential tremor: -Resume home meds  Body mass index is 22.85 kg/m.           DVT prophylaxis:  heparin injection 5,000 Units Start: 10/17/22 0600  Code Status: DNR/DNI Family Communication: Patient's and at bedside Level of care: Med-Surg Status is: Inpatient Remains inpatient appropriate because: Recurrent SBO   Final disposition: Home Consultants:  General surgery  55 minutes with more than 50% spent in reviewing records, counseling patient/family and coordinating care.   Sch Meds:  Scheduled Meds:  heparin  5,000 Units Subcutaneous Q8H   [START ON 10/21/2022] levothyroxine  75 mcg Oral Daily   pantoprazole (PROTONIX) IV  40 mg Intravenous Q24H   primidone  50 mg Oral QHS   rosuvastatin  10 mg Oral Daily   umeclidinium bromide  1 puff Inhalation Daily   verapamil  120 mg  Oral QHS   Continuous Infusions:  0.9 % NaCl with KCl 20 mEq / L 75 mL/hr at 10/20/22 0243   PRN Meds:.acetaminophen **OR** acetaminophen, ketorolac, labetalol, morphine injection, ondansetron (ZOFRAN) IV, phenol, senna-docusate  Antimicrobials: Anti-infectives (From admission, onward)    Start     Dose/Rate Route Frequency Ordered Stop   10/19/22 1015  ceFAZolin (ANCEF) IVPB 2g/100 mL premix        2 g 200 mL/hr over 30 Minutes  Intravenous  Once 10/19/22 1004 10/19/22 1130        I have personally reviewed the following labs and images: CBC: Recent Labs  Lab 10/16/22 1950 10/17/22 0338 10/20/22 0838  WBC 20.2* 12.4* 11.0*  NEUTROABS  --  10.3*  --   HGB 14.9 13.8 11.6*  HCT 46.0 44.6 39.3  MCV 88.3 89.0 95.6  PLT 372 330 202   BMP &GFR Recent Labs  Lab 10/16/22 1950 10/17/22 0338 10/18/22 0422 10/19/22 1402 10/20/22 0409  NA 140 139 139 142 144  K 3.6 3.8 4.1 4.7 4.9  CL 100 103 109 111 112*  CO2 27 27 23  18* 19*  GLUCOSE 124* 117* 79 125* 86  BUN 28* 22 19 19 19   CREATININE 0.96 0.82 0.73 0.80 0.62  CALCIUM 9.5 8.6* 8.0* 8.2* 8.7*  MG  --  1.9  --   --   --    Estimated Creatinine Clearance: 39.3 mL/min (by C-G formula based on SCr of 0.62 mg/dL). Liver & Pancreas: Recent Labs  Lab 10/16/22 1950  AST 21  ALT 18  ALKPHOS 83  BILITOT 0.5  PROT 7.8  ALBUMIN 4.1   Recent Labs  Lab 10/16/22 1950  LIPASE 31   No results for input(s): "AMMONIA" in the last 168 hours. Diabetic: No results for input(s): "HGBA1C" in the last 72 hours. No results for input(s): "GLUCAP" in the last 168 hours. Cardiac Enzymes: No results for input(s): "CKTOTAL", "CKMB", "CKMBINDEX", "TROPONINI" in the last 168 hours. No results for input(s): "PROBNP" in the last 8760 hours. Coagulation Profile: No results for input(s): "INR", "PROTIME" in the last 168 hours. Thyroid Function Tests: No results for input(s): "TSH", "T4TOTAL", "FREET4", "T3FREE", "THYROIDAB" in the last 72 hours. Lipid Profile: No results for input(s): "CHOL", "HDL", "LDLCALC", "TRIG", "CHOLHDL", "LDLDIRECT" in the last 72 hours. Anemia Panel: No results for input(s): "VITAMINB12", "FOLATE", "FERRITIN", "TIBC", "IRON", "RETICCTPCT" in the last 72 hours. Urine analysis:    Component Value Date/Time   COLORURINE YELLOW 10/16/2022 2048   APPEARANCEUR CLEAR 10/16/2022 2048   LABSPEC 1.018 10/16/2022 2048   PHURINE 7.0 10/16/2022 2048    GLUCOSEU NEGATIVE 10/16/2022 2048   HGBUR NEGATIVE 10/16/2022 2048   BILIRUBINUR NEGATIVE 10/16/2022 2048   KETONESUR 5 (A) 10/16/2022 2048   PROTEINUR NEGATIVE 10/16/2022 2048   NITRITE NEGATIVE 10/16/2022 2048   LEUKOCYTESUR NEGATIVE 10/16/2022 2048   Sepsis Labs: Invalid input(s): "PROCALCITONIN", "LACTICIDVEN"  Microbiology: No results found for this or any previous visit (from the past 240 hour(s)).  Radiology Studies: No results found.    Khaliya Golinski T. Dontrail Blackwell [facility]  If 7PM-7AM, please contact night-coverage [email or web address] 10/20/2022, 3:38 PM

## 2022-10-20 NOTE — Plan of Care (Signed)

## 2022-10-20 NOTE — Progress Notes (Signed)
   Progress Note  1 Day Post-Op  Subjective: Pt reports mild abdominal soreness at incisions but most pain actually from NGT. Passing some flatus no BM post-op. Friend at bedside this AM.   Objective: Vital signs in last 24 hours: Temp:  [97.6 F (36.4 C)-98.4 F (36.9 C)] 97.6 F (36.4 C) (08/28 0228) Pulse Rate:  [63-93] 63 (08/28 0228) Resp:  [15-18] 18 (08/28 0228) BP: (131-162)/(61-79) 140/70 (08/28 0228) SpO2:  [92 %-100 %] 94 % (08/28 0228) Weight:  [49.6 kg] 49.6 kg (08/27 1012) Last BM Date : 10/19/22  Intake/Output from previous day: 08/27 0701 - 08/28 0700 In: 2757.8 [I.V.:2407.8; IV Piggyback:350] Out: 100 [Urine:70; Blood:30] Intake/Output this shift: No intake/output data recorded.  PE: General: pleasant, WD, thin female who is laying in bed in NAD Heart: regular, rate, and rhythm. Lungs: Respiratory effort nonlabored Abd: soft, appropriately ttp, ND, +BS, incisions C/D/I, NGT with blood tinged output MS: all 4 extremities are symmetrical with no cyanosis, clubbing, or edema. Psych: A&Ox3 with an appropriate affect.    Lab Results:  Recent Labs    10/20/22 0838  WBC 11.0*  HGB 11.6*  HCT 39.3  PLT 202   BMET Recent Labs    10/19/22 1402 10/20/22 0409  NA 142 144  K 4.7 4.9  CL 111 112*  CO2 18* 19*  GLUCOSE 125* 86  BUN 19 19  CREATININE 0.80 0.62  CALCIUM 8.2* 8.7*   PT/INR No results for input(s): "LABPROT", "INR" in the last 72 hours. CMP     Component Value Date/Time   NA 144 10/20/2022 0409   K 4.9 10/20/2022 0409   CL 112 (H) 10/20/2022 0409   CO2 19 (L) 10/20/2022 0409   GLUCOSE 86 10/20/2022 0409   BUN 19 10/20/2022 0409   CREATININE 0.62 10/20/2022 0409   CALCIUM 8.7 (L) 10/20/2022 0409   PROT 7.8 10/16/2022 1950   ALBUMIN 4.1 10/16/2022 1950   AST 21 10/16/2022 1950   ALT 18 10/16/2022 1950   ALKPHOS 83 10/16/2022 1950   BILITOT 0.5 10/16/2022 1950   GFRNONAA >60 10/20/2022 0409   Lipase     Component Value  Date/Time   LIPASE 31 10/16/2022 1950       Studies/Results: No results found.  Anti-infectives: Anti-infectives (From admission, onward)    Start     Dose/Rate Route Frequency Ordered Stop   10/19/22 1015  ceFAZolin (ANCEF) IVPB 2g/100 mL premix        2 g 200 mL/hr over 30 Minutes Intravenous  Once 10/19/22 1004 10/19/22 1130        Assessment/Plan Recurrent SBO  POD1 s/p laparoscopic LOA Dr. Maisie Fus - NGT with small volume blood tinged output, pt reports passing some flatus - clamp NGT this AM and allow sips of clears, can remove this afternoon if tolerating well  - mobilize as tolerated  FEN: sips, NGT clamping, IVF @ 75 cc/h VTE: SQH ID: ancef pre-op   LOS: 3 days     Juliet Rude, [facility] Surgery 10/20/2022, 9:38 AM Please see Amion for pager number during day hours 7:00am-4:30pm

## 2022-10-20 NOTE — Plan of Care (Signed)
  Problem: Education: Goal: Knowledge of General Education information will improve Description: Including pain rating scale, medication(s)/side effects and non-pharmacologic comfort measures Outcome: Progressing   Problem: Clinical Measurements: Goal: Ability to maintain clinical measurements within normal limits will improve Outcome: Progressing Goal: Diagnostic test results will improve Outcome: Progressing   Problem: Activity: Goal: Risk for activity intolerance will decrease Outcome: Progressing   Problem: Pain Managment: Goal: General experience of comfort will improve Outcome: Progressing   Problem: Safety: Goal: Ability to remain free from injury will improve Outcome: Progressing

## 2022-10-21 DIAGNOSIS — J4489 Other specified chronic obstructive pulmonary disease: Secondary | ICD-10-CM | POA: Diagnosis not present

## 2022-10-21 DIAGNOSIS — I10 Essential (primary) hypertension: Secondary | ICD-10-CM | POA: Diagnosis not present

## 2022-10-21 DIAGNOSIS — K56609 Unspecified intestinal obstruction, unspecified as to partial versus complete obstruction: Secondary | ICD-10-CM | POA: Diagnosis not present

## 2022-10-21 LAB — CBC
HCT: 36.7 % (ref 36.0–46.0)
Hemoglobin: 11 g/dL — ABNORMAL LOW (ref 12.0–15.0)
MCH: 28.5 pg (ref 26.0–34.0)
MCHC: 30 g/dL (ref 30.0–36.0)
MCV: 95.1 fL (ref 80.0–100.0)
Platelets: 192 10*3/uL (ref 150–400)
RBC: 3.86 MIL/uL — ABNORMAL LOW (ref 3.87–5.11)
RDW: 15.7 % — ABNORMAL HIGH (ref 11.5–15.5)
WBC: 6.8 10*3/uL (ref 4.0–10.5)
nRBC: 0.3 % — ABNORMAL HIGH (ref 0.0–0.2)

## 2022-10-21 LAB — RENAL FUNCTION PANEL
Albumin: 2.8 g/dL — ABNORMAL LOW (ref 3.5–5.0)
Anion gap: 5 (ref 5–15)
BUN: 16 mg/dL (ref 8–23)
CO2: 21 mmol/L — ABNORMAL LOW (ref 22–32)
Calcium: 8 mg/dL — ABNORMAL LOW (ref 8.9–10.3)
Chloride: 113 mmol/L — ABNORMAL HIGH (ref 98–111)
Creatinine, Ser: 0.57 mg/dL (ref 0.44–1.00)
GFR, Estimated: >60 mL/min (ref >60)
Glucose, Bld: 82 mg/dL (ref 70–99)
Phosphorus: 1.9 mg/dL — ABNORMAL LOW (ref 2.5–4.6)
Potassium: 4.1 mmol/L (ref 3.5–5.1)
Sodium: 139 mmol/L (ref 135–145)

## 2022-10-21 LAB — GLUCOSE, CAPILLARY
Glucose-Capillary: 75 mg/dL (ref 70–99)
Glucose-Capillary: 110 mg/dL — ABNORMAL HIGH (ref 70–99)
Glucose-Capillary: 115 mg/dL — ABNORMAL HIGH (ref 70–99)

## 2022-10-21 LAB — MAGNESIUM: Magnesium: 2 mg/dL (ref 1.7–2.4)

## 2022-10-21 MED ORDER — SODIUM PHOSPHATES 45 MMOLE/15ML IV SOLN
15.0000 mmol | Freq: Once | INTRAVENOUS | Status: AC
  Administered 2022-10-21: 15 mmol via INTRAVENOUS
  Filled 2022-10-21: qty 5

## 2022-10-21 MED ORDER — TRAMADOL HCL 50 MG PO TABS: 50.0000 mg | ORAL_TABLET | Freq: Four times a day (QID) | ORAL | Status: DC | PRN

## 2022-10-21 MED ORDER — HYDRALAZINE HCL 25 MG PO TABS: 25.0000 mg | ORAL_TABLET | Freq: Four times a day (QID) | ORAL | Status: DC | PRN

## 2022-10-21 MED ORDER — ACETAMINOPHEN 500 MG PO TABS
1000.0000 mg | ORAL_TABLET | Freq: Three times a day (TID) | ORAL | Status: DC
  Administered 2022-10-21 (×2): 1000 mg via ORAL
  Administered 2022-10-22: 500 mg via ORAL
  Filled 2022-10-21 (×3): qty 2

## 2022-10-21 MED ORDER — BISACODYL 10 MG RE SUPP
10.0000 mg | Freq: Once | RECTAL | Status: DC
  Filled 2022-10-21: qty 1

## 2022-10-21 MED ORDER — ACETAMINOPHEN 650 MG RE SUPP: 650.0000 mg | Freq: Three times a day (TID) | RECTAL | Status: DC

## 2022-10-21 NOTE — Progress Notes (Signed)
Mobility Specialist - Progress Note   10/21/22 0856  Mobility  Activity Ambulated independently in hallway;Ambulated independently to bathroom  Level of Assistance Standby assist, set-up cues, supervision of patient - no hands on  Assistive Device None  Distance Ambulated (ft) 350 ft  Range of Motion/Exercises Active  Activity Response Tolerated well  Mobility Referral Yes  $Mobility charge 1 Mobility  Mobility Specialist Start Time (ACUTE ONLY) 0840  Mobility Specialist Stop Time (ACUTE ONLY) 0856  Mobility Specialist Time Calculation (min) (ACUTE ONLY) 16 min   Pt was found in bed and agreeable to ambulate. No complaints with session. At EOS returned to bed with all needs met. Call bell in reach. Friend in room.  Billey Chang Mobility Specialist

## 2022-10-21 NOTE — Plan of Care (Signed)

## 2022-10-21 NOTE — Progress Notes (Signed)
   Progress Note  2 Days Post-Op  Subjective: Overall doing well. Mobilizing in hallways. Tolerating clears. +flatus. Denies BM, states she does not think she will be able to have a BM while in the hospital. Denies nausea/vomiting.  Objective: Vital signs in last 24 hours: Temp:  [97.6 F (36.4 C)-97.8 F (36.6 C)] 97.7 F (36.5 C) (08/29 0528) Pulse Rate:  [61-92] 62 (08/29 0528) Resp:  [18-20] 19 (08/29 0528) BP: (143-165)/(72-84) 165/72 (08/29 0528) SpO2:  [92 %-93 %] 92 % (08/29 0528) Last BM Date : 10/19/22  Intake/Output from previous day: 08/28 0701 - 08/29 0700 In: 1070.8 [I.V.:1070.8] Out: 300 [Urine:300] Intake/Output this shift: No intake/output data recorded.  PE: General: pleasant, WD, thin female who is laying in bed in NAD Heart: regular, rate, and rhythm. Lungs: Respiratory effort nonlabored Abd: soft, appropriately ttp, ND, +BS, incisions C/D/I MS: all 4 extremities are symmetrical with no cyanosis, clubbing, or edema. Psych: A&Ox3 with an appropriate affect.    Lab Results:  Recent Labs    10/20/22 0838 10/21/22 0404  WBC 11.0* 6.8  HGB 11.6* 11.0*  HCT 39.3 36.7  PLT 202 192   BMET Recent Labs    10/20/22 0409 10/21/22 0404  NA 144 139  K 4.9 4.1  CL 112* 113*  CO2 19* 21*  GLUCOSE 86 82  BUN 19 16  CREATININE 0.62 0.57  CALCIUM 8.7* 8.0*   PT/INR No results for input(s): "LABPROT", "INR" in the last 72 hours. CMP     Component Value Date/Time   NA 139 10/21/2022 0404   K 4.1 10/21/2022 0404   CL 113 (H) 10/21/2022 0404   CO2 21 (L) 10/21/2022 0404   GLUCOSE 82 10/21/2022 0404   BUN 16 10/21/2022 0404   CREATININE 0.57 10/21/2022 0404   CALCIUM 8.0 (L) 10/21/2022 0404   PROT 7.8 10/16/2022 1950   ALBUMIN 2.8 (L) 10/21/2022 0404   AST 21 10/16/2022 1950   ALT 18 10/16/2022 1950   ALKPHOS 83 10/16/2022 1950   BILITOT 0.5 10/16/2022 1950   GFRNONAA >60 10/21/2022 0404   Lipase     Component Value Date/Time   LIPASE 31  10/16/2022 1950       Studies/Results: No results found.  Anti-infectives: Anti-infectives (From admission, onward)    Start     Dose/Rate Route Frequency Ordered Stop   10/19/22 1015  ceFAZolin (ANCEF) IVPB 2g/100 mL premix        2 g 200 mL/hr over 30 Minutes Intravenous  Once 10/19/22 1004 10/19/22 1130        Assessment/Plan Recurrent SBO  POD2 s/p laparoscopic LOA Dr. Maisie Fus - AFVSS  - advance to soft diet and start PO pain control. - mobilize as tolerated  FEN: soft diet, agree with stopping IVF, appreciate TRH following  VTE: SQH ID: ancef pre-op   LOS: 4 days     Adam Phenix, [facility] 10/21/2022, 9:43 AM Please see Amion for pager number during day hours 7:00am-4:30pm

## 2022-10-21 NOTE — Plan of Care (Signed)
  Problem: Clinical Measurements: Goal: Diagnostic test results will improve Outcome: Progressing   Problem: Activity: Goal: Risk for activity intolerance will decrease Outcome: Progressing   Problem: Nutrition: Goal: Adequate nutrition will be maintained Outcome: Progressing   Problem: Pain Managment: Goal: General experience of comfort will improve Outcome: Progressing   Problem: Safety: Goal: Ability to remain free from injury will improve Outcome: Progressing   

## 2022-10-21 NOTE — Progress Notes (Signed)
PROGRESS NOTE  Kirsten [name] IRS:[id number] DOB: [birth year]   PCP: [name]., MD  Patient is from: Home.  DOA: 10/16/2022 LOS: 4  Chief complaints Chief Complaint  Patient presents with   Abdominal Pain     Brief Narrative / Interim history: 82 year old F with PMH of COPD, HTN, hypothyroidism, tremor, sacral plexi in 11/2021 at [facility] complicated by prolonged bowel obstruction requiring surgical repair, and recent hospitalization from 8/11-8/14 for SBO returning with abdominal pain and found to have small bowel obstruction.  General surgery consulted.  Patient underwent diagnostic laparoscopy with lysis of adhesion by Dr. Maisie Fus on 8/27.  Patient is improving.    Subjective: Seen and examined earlier this morning.  No major events overnight of this morning.  No complaints.  Feels better.  Passing gases but no bowel movement yet.  Tolerated clear liquid diet.  Likes to try soft diet.  Eager to go home.  Objective: Vitals:   10/20/22 0228 10/20/22 1655 10/20/22 2027 10/21/22 0528  BP: (!) 140/70 (!) 143/84 (!) 160/79 (!) 165/72  Pulse: 63 61 92 62  Resp: 18 20 18 19   Temp: 97.6 F (36.4 C) 97.8 F (36.6 C) 97.6 F (36.4 C) 97.7 F (36.5 C)  TempSrc: Oral Oral Oral Oral  SpO2: 94% 93% 92% 92%  Weight:      Height:        Examination:  GENERAL: No apparent distress.  Nontoxic. HEENT: MMM.  Vision and hearing grossly intact.  NGT in place. NECK: Supple.  No apparent JVD.  RESP:  No IWOB.  Fair aeration bilaterally. CVS:  RRR. Heart sounds normal.  ABD/GI/GU: BS+. Abd soft.  Appropriately tender. MSK/EXT:  Moves extremities. No apparent deformity. No edema.  SKIN: no apparent skin lesion or wound NEURO: Awake, alert and oriented appropriately.  No apparent focal neuro deficit. PSYCH: Calm. Normal affect.   Procedures:  8/27-diagnostic laparoscopy with lysis of adhesion by Dr. Maisie Fus  Microbiology summarized: None  Assessment and plan: Principal  Problem:   SBO (small bowel obstruction) (HCC) Active Problems:   Hypertension   COPD with asthma  Recurrent small bowel obstruction:  -S/p diagnostic laparoscopy with lysis of adhesion by Dr. Maisie Fus on 8/27 -NGT discontinued.  Tolerated CLD.  Advance to soft diet today. -Discontinue IV fluids -Encourage ambulation.  She is motivated.   Essential hypertension: BP slightly elevated. -Discontinue IV fluid -Continue home verapamil -Continue holding HCTZ -P.o. hydralazine as needed   COPD with asthma: Stable -Continue Spiriva and inhalers.   History of SVT: On flecainide as needed. -Continue verapamil and aspirin. -Will resume flecainide if needed   Hypothyroidism: -Resume home Synthroid.   Essential tremor: -Resume home meds  Body mass index is 22.85 kg/m.           DVT prophylaxis:  heparin injection 5,000 Units Start: 10/17/22 0600  Code Status: DNR/DNI Family Communication: Patient's friend at bedside Level of care: Med-Surg Status is: Inpatient Remains inpatient appropriate because: Recurrent SBO   Final disposition: Home Consultants:  General surgery  35 minutes with more than 50% spent in reviewing records, counseling patient/family and coordinating care.   Sch Meds:  Scheduled Meds:  acetaminophen  1,000 mg Oral Q8H   Or   acetaminophen  650 mg Rectal Q8H   bisacodyl  10 mg Rectal Once   heparin  5,000 Units Subcutaneous Q8H   levothyroxine  75 mcg Oral Daily   pantoprazole (PROTONIX) IV  40 mg Intravenous Q24H   primidone  50 mg Oral QHS   rosuvastatin  10 mg Oral Daily   umeclidinium bromide  1 puff Inhalation Daily   verapamil  120 mg Oral QHS   Continuous Infusions:  sodium phosphate 15 mmol in dextrose 5 % 250 mL infusion 15 mmol (10/21/22 1146)   PRN Meds:.ketorolac, labetalol, morphine injection, ondansetron (ZOFRAN) IV, phenol, senna-docusate, traMADol  Antimicrobials: Anti-infectives (From admission, onward)    Start      Dose/Rate Route Frequency Ordered Stop   10/19/22 1015  ceFAZolin (ANCEF) IVPB 2g/100 mL premix        2 g 200 mL/hr over 30 Minutes Intravenous  Once 10/19/22 1004 10/19/22 1130        I have personally reviewed the following labs and images: CBC: Recent Labs  Lab 10/16/22 1950 10/17/22 0338 10/20/22 0838 10/21/22 0404  WBC 20.2* 12.4* 11.0* 6.8  NEUTROABS  --  10.3*  --   --   HGB 14.9 13.8 11.6* 11.0*  HCT 46.0 44.6 39.3 36.7  MCV 88.3 89.0 95.6 95.1  PLT 372 330 202 192   BMP &GFR Recent Labs  Lab 10/17/22 0338 10/18/22 0422 10/19/22 1402 10/20/22 0409 10/21/22 0404  NA 139 139 142 144 139  K 3.8 4.1 4.7 4.9 4.1  CL 103 109 111 112* 113*  CO2 27 23 18* 19* 21*  GLUCOSE 117* 79 125* 86 82  BUN 22 19 19 19 16   CREATININE 0.82 0.73 0.80 0.62 0.57  CALCIUM 8.6* 8.0* 8.2* 8.7* 8.0*  MG 1.9  --   --   --  2.0  PHOS  --   --   --   --  1.9*   Estimated Creatinine Clearance: 39.3 mL/min (by C-G formula based on SCr of 0.57 mg/dL). Liver & Pancreas: Recent Labs  Lab 10/16/22 1950 10/21/22 0404  AST 21  --   ALT 18  --   ALKPHOS 83  --   BILITOT 0.5  --   PROT 7.8  --   ALBUMIN 4.1 2.8*   Recent Labs  Lab 10/16/22 1950  LIPASE 31   No results for input(s): "AMMONIA" in the last 168 hours. Diabetic: No results for input(s): "HGBA1C" in the last 72 hours. Recent Labs  Lab 10/21/22 0733 10/21/22 1136  GLUCAP 75 110*   Cardiac Enzymes: No results for input(s): "CKTOTAL", "CKMB", "CKMBINDEX", "TROPONINI" in the last 168 hours. No results for input(s): "PROBNP" in the last 8760 hours. Coagulation Profile: No results for input(s): "INR", "PROTIME" in the last 168 hours. Thyroid Function Tests: No results for input(s): "TSH", "T4TOTAL", "FREET4", "T3FREE", "THYROIDAB" in the last 72 hours. Lipid Profile: No results for input(s): "CHOL", "HDL", "LDLCALC", "TRIG", "CHOLHDL", "LDLDIRECT" in the last 72 hours. Anemia Panel: No results for input(s):  "VITAMINB12", "FOLATE", "FERRITIN", "TIBC", "IRON", "RETICCTPCT" in the last 72 hours. Urine analysis:    Component Value Date/Time   COLORURINE YELLOW 10/16/2022 2048   APPEARANCEUR CLEAR 10/16/2022 2048   LABSPEC 1.018 10/16/2022 2048   PHURINE 7.0 10/16/2022 2048   GLUCOSEU NEGATIVE 10/16/2022 2048   HGBUR NEGATIVE 10/16/2022 2048   BILIRUBINUR NEGATIVE 10/16/2022 2048   KETONESUR 5 (A) 10/16/2022 2048   PROTEINUR NEGATIVE 10/16/2022 2048   NITRITE NEGATIVE 10/16/2022 2048   LEUKOCYTESUR NEGATIVE 10/16/2022 2048   Sepsis Labs: Invalid input(s): "PROCALCITONIN", "LACTICIDVEN"  Microbiology: No results found for this or any previous visit (from the past 240 hour(s)).  Radiology Studies: No results found.    Quinlyn Tep [facility]  If 7PM-7AM, please contact night-coverage [email or web address] 10/21/2022, 12:33 PM

## 2022-10-21 NOTE — Consult Note (Signed)
   [facility]  [facility] Inpatient Consult   10/21/2022  Kirsten [name] 1940/07/26 [id number]   [facility] [[facility]]  Accountable Care Organization [ACO] Patient: Medicare ACO REACH insurance  [name] Liaison remote coverage review for patient admitted to [facility]   Patient was reviewed for readmission prevention measure  Primary Care Provider:  [name]., MD with [facility] which  is listed to provide the transition of care follow up calls and appointments  Patient screened for less than 30 days readmission with 2 hospitalizations in [facility] in the past 6 months with noted low risk score for unplanned readmission risk and to assess for potential [facility]  [[facility]] Care Management service needs for post hospital transition for care coordination.  Review of patient's electronic medical record reveals patient is admitted with recurrent Small Bowel Obstruction with Lysis of Adhesions.  Reviewed SDOH and inpatient medical records for community [facility] follow up needs.    Plan:  Continue to follow progress and disposition to assess for post hospital community care coordination/management needs.  Referral request for community care coordination: no current needs assessed for community Care Coordination for follow up  Of note, [facility] Care Management/Population Health does not replace or interfere with any arrangements made by the Inpatient Transition of Care team.  For questions contact:   Charlesetta Shanks, RN BSN CCM [facility]  [phone] business mobile phone Toll free office [phone]  Fax number: [phone] [email or web address]

## 2022-10-21 NOTE — Discharge Instructions (Signed)

## 2022-10-21 NOTE — Plan of Care (Signed)

## 2022-10-22 DIAGNOSIS — K56609 Unspecified intestinal obstruction, unspecified as to partial versus complete obstruction: Secondary | ICD-10-CM | POA: Diagnosis not present

## 2022-10-22 LAB — CBC
HCT: 33.4 % — ABNORMAL LOW (ref 36.0–46.0)
Hemoglobin: 10 g/dL — ABNORMAL LOW (ref 12.0–15.0)
MCH: 28.2 pg (ref 26.0–34.0)
MCHC: 29.9 g/dL — ABNORMAL LOW (ref 30.0–36.0)
MCV: 94.1 fL (ref 80.0–100.0)
Platelets: 174 10*3/uL (ref 150–400)
RBC: 3.55 MIL/uL — ABNORMAL LOW (ref 3.87–5.11)
RDW: 15.3 % (ref 11.5–15.5)
WBC: 4.9 10*3/uL (ref 4.0–10.5)
nRBC: 0 % (ref 0.0–0.2)

## 2022-10-22 LAB — RENAL FUNCTION PANEL
Albumin: 2.5 g/dL — ABNORMAL LOW (ref 3.5–5.0)
Anion gap: 5 (ref 5–15)
BUN: 15 mg/dL (ref 8–23)
CO2: 22 mmol/L (ref 22–32)
Calcium: 7.9 mg/dL — ABNORMAL LOW (ref 8.9–10.3)
Chloride: 113 mmol/L — ABNORMAL HIGH (ref 98–111)
Creatinine, Ser: 0.54 mg/dL (ref 0.44–1.00)
GFR, Estimated: >60 mL/min (ref >60)
Glucose, Bld: 76 mg/dL (ref 70–99)
Phosphorus: 2.8 mg/dL (ref 2.5–4.6)
Potassium: 3.6 mmol/L (ref 3.5–5.1)
Sodium: 140 mmol/L (ref 135–145)

## 2022-10-22 LAB — MAGNESIUM: Magnesium: 2.1 mg/dL (ref 1.7–2.4)

## 2022-10-22 MED ORDER — ACETAMINOPHEN 325 MG PO TABS: 650.0000 mg | ORAL_TABLET | Freq: Four times a day (QID) | ORAL | Status: AC | PRN

## 2022-10-22 MED ORDER — PANTOPRAZOLE SODIUM 40 MG PO TBEC
40.0000 mg | DELAYED_RELEASE_TABLET | Freq: Every day | ORAL | Status: DC
  Administered 2022-10-22: 40 mg via ORAL
  Filled 2022-10-22: qty 1

## 2022-10-22 MED ORDER — POLYETHYLENE GLYCOL 3350 17 GM/SCOOP PO POWD: 17.0000 g | Freq: Two times a day (BID) | ORAL | 1 refills | Status: AC | PRN

## 2022-10-22 NOTE — Discharge Summary (Signed)
Physician Discharge Summary  Kirsten [name] YNW:[id number] DOB: Apr 24, 1940 DOA: 10/16/2022  PCP: [name]., MD  Admit date: 10/16/2022 Discharge date: 10/22/2022 Admitted From: Home Disposition: Home Recommendations for Outpatient Follow-up:  Outpatient follow-up with PCP and general surgery as below Check CMP and CBC at follow-up Please follow up on the following pending results: None  Home Health: Not indicated Equipment/Devices: Not indicated  Discharge Condition: Stable CODE STATUS: DNR/DNI  Follow-up Information     [name], MD. Go on 11/15/2022.   Specialties: General Surgery, Colon and Rectal Surgery Why: 10:20 AM, please arrive by 9:50 AM to check in. Contact information: [address] [place] [address] [phone]         [name]., MD. Schedule an appointment as soon as possible for a visit in 1 week(s).   Specialty: Internal Medicine Contact information: [address] [place] [address] [phone]                 Hospital course 82 year old F with PMH of COPD, HTN, hypothyroidism, tremor, sacral plexi in 11/2021 at [facility] complicated by prolonged bowel obstruction requiring surgical repair, and recent hospitalization from 8/11-8/14 for SBO returning with abdominal pain and found to have small bowel obstruction.  General surgery consulted.  Patient underwent diagnostic laparoscopy with lysis of adhesion by Dr. Maisie Fus on 8/27.   Patient improved postoperatively without complication.  Tolerated soft diet.  Had a bowel movement.  She was cleared for discharge by general surgery for outpatient follow-up.  See individual problem list below for more.   Problems addressed during this hospitalization Principal Problem:   SBO (small bowel obstruction) (HCC) Active Problems:   Hypertension   COPD with asthma   Recurrent small bowel obstruction: SBO resolved. -S/p diagnostic laparoscopy with lysis of  adhesion by Dr. Maisie Fus on 8/27 -Improved and tolerated soft diet. -Cleared for discharge by general surgery for outpatient follow-up.   Essential hypertension: BP slightly elevated.  HCTZ was held during hospitalization. -Continue home meds.  COPD with asthma: Stable -Continue home meds.   History of SVT: Slightly bradycardic but with positive chronotropic effect to exertion. -Continue verapamil and flecainide.   Hypothyroidism: -Continue Synthroid   Essential tremor: -Continue home meds.           Time spent 35 minutes  Vital signs Vitals:   10/21/22 0528 10/21/22 1314 10/21/22 2036 10/22/22 0401  BP: (!) 165/72 (!) 144/70 (!) 142/74 (!) 159/69  Pulse: 62 65 (!) 53 (!) 49  Temp: 97.7 F (36.5 C) 97.7 F (36.5 C) 97.6 F (36.4 C) 97.8 F (36.6 C)  Resp: 19 18 18 18   Height:      Weight:      SpO2: 92% 93% 96% 95%  TempSrc: Oral Oral Oral Oral  BMI (Calculated):         Discharge exam  GENERAL: No apparent distress.  Nontoxic. HEENT: MMM.  Vision and hearing grossly intact.  NECK: Supple.  No apparent JVD.  RESP:  No IWOB.  Fair aeration bilaterally. CVS: Bradycardic to 50s but positive chronotropic effect.  Heart sounds normal.  ABD/GI/GU: BS+. Abd soft.  No significant tenderness.  Laparoscopic wounds clean. MSK/EXT:  Moves extremities. No apparent deformity. No edema.  SKIN: no apparent skin lesion or wound NEURO: Awake and alert. Oriented appropriately.  No apparent focal neuro deficit. PSYCH: Calm. Normal affect.   Discharge Instructions Discharge Instructions     Diet - low sodium heart  healthy   Complete by: As directed    Discharge instructions   Complete by: As directed    It has been a pleasure taking care of you!  You were hospitalized for small bowel obstruction for which you have been treated surgically.  Follow-up with your surgeon per their recommendation.  Follow-up with your primary care doctor in 1 to 2 weeks or sooner if  needed.   Take care,   Increase activity slowly   Complete by: As directed       Allergies as of 10/22/2022   No Known Allergies      Medication List     TAKE these medications    acetaminophen 325 MG tablet Commonly known as: TYLENOL Take 2 tablets (650 mg total) by mouth every 6 (six) hours as needed for up to 5 days for mild pain, fever or headache.   albuterol 108 (90 Base) MCG/ACT inhaler Commonly known as: VENTOLIN HFA Inhale 2 puffs into the lungs every 6 (six) hours as needed for wheezing.   aspirin 81 MG tablet Take 81 mg by mouth daily.   Biotin 5000 MCG Tabs Take 5,000 mcg by mouth daily. \   CINNAMON PO Take 1,000 mg by mouth daily.   estrogens (conjugated) 0.9 MG tablet Commonly known as: PREMARIN Take 0.9 mg by mouth as directed. Take daily for 21 days then do not take for 7 days.   flecainide 50 MG tablet Commonly known as: TAMBOCOR TAKE 1 TABLET BY MOUTH TWICE A DAY MAY TAKE 1 ADDITIONAL TABLET AS NEEDED FOR FAST HEART BEAT   hydrochlorothiazide 25 MG tablet Commonly known as: HYDRODIURIL Take 1 tablet by mouth daily.   OSTEO BI-FLEX REGULAR STRENGTH PO Take 1 tablet by mouth daily.   polyethylene glycol powder 17 GM/SCOOP powder Commonly known as: MiraLax Take 17 g by mouth 2 (two) times daily as needed for mild constipation.   primidone 50 MG tablet Commonly known as: MYSOLINE Take 50 mg by mouth at bedtime.   rosuvastatin 10 MG tablet Commonly known as: CRESTOR Take 1 tablet by mouth daily.   Synthroid 75 MCG tablet Generic drug: levothyroxine Take 1 tablet by mouth daily.   tiotropium 18 MCG inhalation capsule Commonly known as: Spiriva HandiHaler PLACE 1 CAPSULE INTO INHALER AND INHALE DAILY. What changed:  how much to take when to take this reasons to take this   triamcinolone cream 0.1 % Commonly known as: KENALOG Apply 1 Application topically 2 (two) times daily.   verapamil 120 MG CR tablet Commonly known as:  CALAN-SR Take 0.5 tablets (60 mg total) by mouth at bedtime. What changed: how much to take   Vitamin D (Ergocalciferol) 1.25 MG (50000 UNIT) Caps capsule Commonly known as: DRISDOL Take 50,000 Units by mouth. Every other week on sunday   zolpidem 10 MG tablet Commonly known as: AMBIEN Take 10 mg by mouth at bedtime as needed for sleep.        Consultations: General surgery  Procedures/Studies: 8/27-diagnostic laparoscopy with lysis of adhesion by Dr. Terri Skains Abd Portable 1V-Small Bowel Obstruction Protocol-initial, 8 hr delay  Result Date: 10/17/2022 CLINICAL DATA:  8 hour follow-up small-bowel film EXAM: PORTABLE ABDOMEN - 1 VIEW COMPARISON:  Film from earlier in the same day. FINDINGS: Mild scattered large and small bowel gas is noted. Previously administered contrast lies within the stomach as well as in the dilated loops of small bowel. No definitive colonic contrast is noted at this time. No bony  abnormality is seen. No free air is noted. IMPRESSION: No colonic contrast is noted at this time. 24 hour follow-up film is recommended. Electronically Signed   By: Alcide Clever M.D.   On: 10/17/2022 20:35   DG Abd 1 View  Result Date: 10/17/2022 CLINICAL DATA:  Nasogastric tube placement EXAM: ABDOMEN - 1 VIEW COMPARISON:  Yesterday FINDINGS: Enteric tube with tip and side port at the stomach. Excreting renal contrast from recent CT. Known dilated small bowel in the central abdomen. Symmetric aeration at the lung bases. IMPRESSION: Enteric tube with tip and side port at the stomach. Electronically Signed   By: Tiburcio Pea M.D.   On: 10/17/2022 04:09   CT ABDOMEN PELVIS W CONTRAST  Result Date: 10/16/2022 CLINICAL DATA:  Generalized abdominal pain and vomiting. Recent small bowel obstruction. EXAM: CT ABDOMEN AND PELVIS WITH CONTRAST TECHNIQUE: Multidetector CT imaging of the abdomen and pelvis was performed using the standard protocol following bolus administration of  intravenous contrast. RADIATION DOSE REDUCTION: This exam was performed according to the departmental dose-optimization program which includes automated exposure control, adjustment of the mA and/or kV according to patient size and/or use of iterative reconstruction technique. CONTRAST:  OMNIPAQUE IOHEXOL 300 MG/ML  SOLN COMPARISON:  CT 10/03/2022.  X-ray 10/05/2022 and older FINDINGS: Lower chest: Linear opacity along the lung bases likely scar or atelectasis. No pleural effusion. Small hiatal hernia. Coronary artery calcifications are seen. Hepatobiliary: Fatty liver infiltration patent portal vein. Gallbladder is present. No space-occupying liver lesion. Pancreas: Unremarkable. No pancreatic ductal dilatation or surrounding inflammatory changes. Spleen: Spleen has a small cystic area inferiorly, too small to completely characterize but likely a benign lesion. No specific imaging follow-up. Spleen is nonenlarged. Adrenals/Urinary Tract: Adrenal glands are preserved. Mild bilateral renal atrophy. There are some benign Bosniak 1 and 2 renal cystic foci identified. No specific imaging follow-up. Ureters have normal course and caliber. Preserved contours of the urinary bladder. Stomach/Bowel: There is some debris in the stomach. Stomach is nondilated. There is a second portion duodenal diverticulum. Large bowel is of normal course and caliber with scattered stool. Sigmoid colon diverticula. There are some dilated loops of fluid-filled small bowel in the mid to upper pelvis. Few air-fluid levels. These measure up to 3.5 cm in diameter. There are some small bowel stool appearance as well along loops in the right hemipelvis with an abrupt transition seen in the anterolateral right hemipelvis. Transition seen for example sagittal series 10, image 76, axial series 3 image 61 and 60 and coronal series 8, image 59. This in a similar location on the prior examination. No pneumatosis or portal venous gas.  Vascular/Lymphatic: Aortic atherosclerosis. No enlarged abdominal or pelvic lymph nodes. Reproductive: Status post hysterectomy. No adnexal masses. Other: Mild stranding and mesenteric fluid.  No free air Musculoskeletal: Curvature of the lumbar spine. Scattered degenerative changes of the spine. Scattered degenerative changes of the pelvis. IMPRESSION: Similar to previous thirds mild-to-moderately dilated loops of small bowel with air-fluid levels, small bowel stool appearance and transition in the right lateral hemipelvis anteriorly. No pneumatosis or free air. Scattered free fluid. Fatty liver infiltration Electronically Signed   By: Karen Kays M.D.   On: 10/16/2022 21:13   DG Abd Portable 1V  Result Date: 10/05/2022 CLINICAL DATA:  Small-bowel obstruction. EXAM: PORTABLE ABDOMEN - 1 VIEW COMPARISON:  KUB 10/04/2022. FINDINGS: Previously seen bowel dilation is improved. Contrast likely in colon. No obvious evidence of free air on this limited supine radiograph. No abnormal calcifications although  bowel gas limits assessment. No evidence of acute bony abnormality. IMPRESSION: Previously seen bowel dilation is improved. Contrast likely in colon. Electronically Signed   By: Feliberto Harts M.D.   On: 10/05/2022 16:31   DG Abd Portable 1V-Small Bowel Obstruction Protocol-initial, 8 hr delay  Result Date: 10/04/2022 CLINICAL DATA:  8 hour small-bowel follow-up study EXAM: PORTABLE ABDOMEN - 1 VIEW COMPARISON:  None Available. FINDINGS: Previously administered contrast material lies within the stomach as well as multiple dilated loops of small bowel. No significant colonic contrast is noted. IMPRESSION: Persistent small bowel dilatation containing contrast. No colonic contrast is seen. 24 hour follow-up film is recommended. Electronically Signed   By: Alcide Clever M.D.   On: 10/04/2022 21:56   CT ABDOMEN PELVIS W CONTRAST  Result Date: 10/03/2022 CLINICAL DATA:  Concern for bowel obstruction. EXAM: CT  ABDOMEN AND PELVIS WITH CONTRAST TECHNIQUE: Multidetector CT imaging of the abdomen and pelvis was performed using the standard protocol following bolus administration of intravenous contrast. RADIATION DOSE REDUCTION: This exam was performed according to the departmental dose-optimization program which includes automated exposure control, adjustment of the mA and/or kV according to patient size and/or use of iterative reconstruction technique. CONTRAST:  OMNIPAQUE IOHEXOL 300 MG/ML  SOLN COMPARISON:  CT abdomen pelvis dated 02/01/2006. FINDINGS: Lower chest: The visualized lung bases are clear. There is coronary vascular calcification. No intra-abdominal free air or free fluid. Hepatobiliary: The liver is unremarkable. No related patient. The gallbladder is unremarkable. Pancreas: Unremarkable. No pancreatic ductal dilatation or surrounding inflammatory changes. Spleen: Normal in size without focal abnormality. Adrenals/Urinary Tract: The adrenal glands are unremarkable. Small bilateral renal cysts suboptimally evaluated on this CT. This can be better evaluated with ultrasound on a nonemergent/outpatient basis. There is no hydronephrosis on either side. There is symmetric enhancement and excretion of contrast by both kidneys. The visualized ureters and urinary bladder appear unremarkable. Stomach/Bowel: Multiple mildly dilated loops of small bowel measure up to 3.7 cm. A transition is noted in the right lower quadrant (56/2 and coronal 52/7) likely representing adhesions. The distal small bowel are collapsed. The appendix is not visualized with certainty. No inflammatory changes identified in the right lower quadrant. Vascular/Lymphatic: Advanced aortoiliac atherosclerotic disease. The IVC is unremarkable. No portal venous gas. There is no adenopathy. Reproductive: Hysterectomy.  No adnexal masses. Other: None Musculoskeletal: Osteopenia with degenerative changes. No acute osseous pathology. IMPRESSION: 1.  Small-bowel obstruction with a transition in the right lower quadrant likely secondary to adhesions. 2.  Aortic Atherosclerosis (ICD10-I70.0). Electronically Signed   By: Elgie Collard M.D.   On: 10/03/2022 20:39       The results of significant diagnostics from this hospitalization (including imaging, microbiology, ancillary and laboratory) are listed below for reference.     Microbiology: No results found for this or any previous visit (from the past 240 hour(s)).   Labs:  CBC: Recent Labs  Lab 10/16/22 1950 10/17/22 0338 10/20/22 0838 10/21/22 0404 10/22/22 0359  WBC 20.2* 12.4* 11.0* 6.8 4.9  NEUTROABS  --  10.3*  --   --   --   HGB 14.9 13.8 11.6* 11.0* 10.0*  HCT 46.0 44.6 39.3 36.7 33.4*  MCV 88.3 89.0 95.6 95.1 94.1  PLT 372 330 202 192 174   BMP &GFR Recent Labs  Lab 10/17/22 0338 10/18/22 0422 10/19/22 1402 10/20/22 0409 10/21/22 0404 10/22/22 0359  NA 139 139 142 144 139 140  K 3.8 4.1 4.7 4.9 4.1 3.6  CL 103 109 111  112* 113* 113*  CO2 27 23 18* 19* 21* 22  GLUCOSE 117* 79 125* 86 82 76  BUN 22 19 19 19 16 15   CREATININE 0.82 0.73 0.80 0.62 0.57 0.54  CALCIUM 8.6* 8.0* 8.2* 8.7* 8.0* 7.9*  MG 1.9  --   --   --  2.0 2.1  PHOS  --   --   --   --  1.9* 2.8   Estimated Creatinine Clearance: 39.3 mL/min (by C-G formula based on SCr of 0.54 mg/dL). Liver & Pancreas: Recent Labs  Lab 10/16/22 1950 10/21/22 0404 10/22/22 0359  AST 21  --   --   ALT 18  --   --   ALKPHOS 83  --   --   BILITOT 0.5  --   --   PROT 7.8  --   --   ALBUMIN 4.1 2.8* 2.5*   Recent Labs  Lab 10/16/22 1950  LIPASE 31   No results for input(s): "AMMONIA" in the last 168 hours. Diabetic: No results for input(s): "HGBA1C" in the last 72 hours. Recent Labs  Lab 10/21/22 0733 10/21/22 1136 10/21/22 1617  GLUCAP 75 110* 115*   Cardiac Enzymes: No results for input(s): "CKTOTAL", "CKMB", "CKMBINDEX", "TROPONINI" in the last 168 hours. No results for input(s):  "PROBNP" in the last 8760 hours. Coagulation Profile: No results for input(s): "INR", "PROTIME" in the last 168 hours. Thyroid Function Tests: No results for input(s): "TSH", "T4TOTAL", "FREET4", "T3FREE", "THYROIDAB" in the last 72 hours. Lipid Profile: No results for input(s): "CHOL", "HDL", "LDLCALC", "TRIG", "CHOLHDL", "LDLDIRECT" in the last 72 hours. Anemia Panel: No results for input(s): "VITAMINB12", "FOLATE", "FERRITIN", "TIBC", "IRON", "RETICCTPCT" in the last 72 hours. Urine analysis:    Component Value Date/Time   COLORURINE YELLOW 10/16/2022 2048   APPEARANCEUR CLEAR 10/16/2022 2048   LABSPEC 1.018 10/16/2022 2048   PHURINE 7.0 10/16/2022 2048   GLUCOSEU NEGATIVE 10/16/2022 2048   HGBUR NEGATIVE 10/16/2022 2048   BILIRUBINUR NEGATIVE 10/16/2022 2048   KETONESUR 5 (A) 10/16/2022 2048   PROTEINUR NEGATIVE 10/16/2022 2048   NITRITE NEGATIVE 10/16/2022 2048   LEUKOCYTESUR NEGATIVE 10/16/2022 2048   Sepsis Labs: Invalid input(s): "PROCALCITONIN", "LACTICIDVEN"   SIGNED:  Almon Hercules, MD  [facility] 10/22/2022, 2:44 PM

## 2022-10-22 NOTE — Progress Notes (Signed)
   Progress Note  3 Days Post-Op  Subjective: Overall doing well. Mobilizing in hallways. Tolerating RD. +flatus. Had a BM.  Objective: Vital signs in last 24 hours: Temp:  [97.6 F (36.4 C)-97.8 F (36.6 C)] 97.8 F (36.6 C) (08/30 0401) Pulse Rate:  [49-65] 49 (08/30 0401) Resp:  [18] 18 (08/30 0401) BP: (142-159)/(69-74) 159/69 (08/30 0401) SpO2:  [93 %-96 %] 95 % (08/30 0401) Last BM Date : 10/21/22  Intake/Output from previous day: 08/29 0701 - 08/30 0700 In: 120 [P.O.:120] Out: -  Intake/Output this shift: No intake/output data recorded.  PE: General: pleasant, WD, thin female who is laying in bed in NAD Heart: regular, rate, and rhythm. Lungs: Respiratory effort nonlabored Abd: soft, appropriately ttp, ND, +BS, incisions C/D/I MS: all 4 extremities are symmetrical with no cyanosis, clubbing, or edema. Psych: A&Ox3 with an appropriate affect.    Lab Results:  Recent Labs    10/21/22 0404 10/22/22 0359  WBC 6.8 4.9  HGB 11.0* 10.0*  HCT 36.7 33.4*  PLT 192 174   BMET Recent Labs    10/21/22 0404 10/22/22 0359  NA 139 140  K 4.1 3.6  CL 113* 113*  CO2 21* 22  GLUCOSE 82 76  BUN 16 15  CREATININE 0.57 0.54  CALCIUM 8.0* 7.9*   PT/INR No results for input(s): "LABPROT", "INR" in the last 72 hours. CMP     Component Value Date/Time   NA 140 10/22/2022 0359   K 3.6 10/22/2022 0359   CL 113 (H) 10/22/2022 0359   CO2 22 10/22/2022 0359   GLUCOSE 76 10/22/2022 0359   BUN 15 10/22/2022 0359   CREATININE 0.54 10/22/2022 0359   CALCIUM 7.9 (L) 10/22/2022 0359   PROT 7.8 10/16/2022 1950   ALBUMIN 2.5 (L) 10/22/2022 0359   AST 21 10/16/2022 1950   ALT 18 10/16/2022 1950   ALKPHOS 83 10/16/2022 1950   BILITOT 0.5 10/16/2022 1950   GFRNONAA >60 10/22/2022 0359   Lipase     Component Value Date/Time   LIPASE 31 10/16/2022 1950       Studies/Results: No results found.  Anti-infectives: Anti-infectives (From admission, onward)    Start      Dose/Rate Route Frequency Ordered Stop   10/19/22 1015  ceFAZolin (ANCEF) IVPB 2g/100 mL premix        2 g 200 mL/hr over 30 Minutes Intravenous  Once 10/19/22 1004 10/19/22 1130        Assessment/Plan Recurrent SBO  POD3 s/p laparoscopic LOA Dr. Maisie Fus - AFVSS  - cont soft diet and PO pain control. - mobilize as tolerated - ok for d/c home  FEN: soft diet, agree with stopping IVF, appreciate TRH following  VTE: SQH ID: ancef pre-op   LOS: 5 days     Vanita Panda, MD [facility] 10/22/2022, 8:40 AM Please see Amion for pager number during day hours 7:00am-4:30pm

## 2022-10-22 NOTE — Plan of Care (Signed)

## 2022-10-27 DIAGNOSIS — J449 Chronic obstructive pulmonary disease, unspecified: Secondary | ICD-10-CM | POA: Diagnosis not present

## 2022-10-27 DIAGNOSIS — E440 Moderate protein-calorie malnutrition: Secondary | ICD-10-CM | POA: Diagnosis not present

## 2022-10-27 DIAGNOSIS — Z8719 Personal history of other diseases of the digestive system: Secondary | ICD-10-CM | POA: Diagnosis not present

## 2022-10-27 DIAGNOSIS — I10 Essential (primary) hypertension: Secondary | ICD-10-CM | POA: Diagnosis not present

## 2022-10-27 DIAGNOSIS — D649 Anemia, unspecified: Secondary | ICD-10-CM | POA: Diagnosis not present

## 2022-11-05 ENCOUNTER — Ambulatory Visit: Payer: Medicare Other | Admitting: Internal Medicine

## 2022-12-09 DIAGNOSIS — Z006 Encounter for examination for normal comparison and control in clinical research program: Secondary | ICD-10-CM | POA: Diagnosis not present

## 2022-12-16 NOTE — Plan of Care (Signed)
[facility] Tonsillectomy/Adenoidectomy, Postoperative PEDS care plan entered in error.

## 2022-12-23 ENCOUNTER — Ambulatory Visit: Payer: Medicare Other | Attending: Internal Medicine | Admitting: Internal Medicine

## 2022-12-23 ENCOUNTER — Encounter: Payer: Self-pay | Admitting: Internal Medicine

## 2022-12-23 VITALS — BP 136/64 | HR 56 | Ht <= 58 in | Wt 112.8 lb

## 2022-12-23 DIAGNOSIS — I493 Ventricular premature depolarization: Secondary | ICD-10-CM | POA: Insufficient documentation

## 2022-12-23 NOTE — Progress Notes (Signed)
HPI Kirsten [name] returns today for followup. She is a pleasant 82 yo woman with a h/o palpitations who has been controlled on low dose flecainide. She has lost 40 lbs since her last visit as she is dieting. She has been ill and had what sounds like a bowel obstruction.  No Known Allergies   Current Outpatient Medications  Medication Sig Dispense Refill   albuterol (VENTOLIN HFA) 108 (90 Base) MCG/ACT inhaler Inhale 2 puffs into the lungs every 6 (six) hours as needed for wheezing.     aspirin 81 MG tablet Take 81 mg by mouth daily.     Biotin 5000 MCG TABS Take 5,000 mcg by mouth daily. \     CINNAMON PO Take 1,000 mg by mouth daily.     estrogens, conjugated, (PREMARIN) 0.9 MG tablet Take 0.9 mg by mouth as directed. Take daily for 21 days then do not take for 7 days.     flecainide (TAMBOCOR) 50 MG tablet TAKE 1 TABLET BY MOUTH TWICE A DAY MAY TAKE 1 ADDITIONAL TABLET AS NEEDED FOR FAST HEART BEAT 225 tablet 2   Glucosamine-Chondroitin (OSTEO BI-FLEX REGULAR STRENGTH PO) Take 1 tablet by mouth daily.     hydrochlorothiazide (HYDRODIURIL) 25 MG tablet Take 1 tablet by mouth daily.     polyethylene glycol powder (MIRALAX) 17 GM/SCOOP powder Take 17 g by mouth 2 (two) times daily as needed for mild constipation. 255 g 1   primidone (MYSOLINE) 50 MG tablet Take 50 mg by mouth at bedtime.     rosuvastatin (CRESTOR) 10 MG tablet Take 1 tablet by mouth daily.     SYNTHROID 75 MCG tablet Take 1 tablet by mouth daily.     tiotropium (SPIRIVA HANDIHALER) 18 MCG inhalation capsule PLACE 1 CAPSULE INTO INHALER AND INHALE DAILY. (Patient taking differently: 18 mcg daily as needed (Shortness of breath). PLACE 1 CAPSULE INTO INHALER AND INHALE DAILY.) 90 capsule 4   triamcinolone cream (KENALOG) 0.1 % Apply 1 Application topically 2 (two) times daily.     verapamil (CALAN-SR) 120 MG CR tablet Take 0.5 tablets (60 mg total) by mouth at bedtime. (Patient taking differently: Take 120 mg by mouth at  bedtime.) 30 tablet 2   Vitamin D, Ergocalciferol, (DRISDOL) 50000 UNITS CAPS Take 50,000 Units by mouth. Every other week on sunday     zolpidem (AMBIEN) 10 MG tablet Take 10 mg by mouth at bedtime as needed for sleep.      No current facility-administered medications for this visit.     Past Medical History:  Diagnosis Date   Allergy    occasional seasonal   Asthma    COPD (chronic obstructive pulmonary disease) (HCC)    Emphysema of lung (HCC)    H/O: hysterectomy    Heart murmur    HTN (hypertension)    HTN (hypertension)    IGT (impaired glucose tolerance)    Irregular heart rate    take Flecinade   OSA (obstructive sleep apnea) 12/08/2017   Osteopenia    Palpitations    PONV (postoperative nausea and vomiting)    Thyroid disease    hypothyroid   Tinnitus    Tremor, essential    Vertigo     ROS:   All systems reviewed and negative except as noted in the HPI.   Past Surgical History:  Procedure Laterality Date   CATARACT EXTRACTION     both eyes- February 2015   LAPAROSCOPY N/A 10/19/2022   Procedure:  LAPAROSCOPY DIAGNOSTIC WITH LYSIS OF ADHESIONS;  Surgeon: [name], MD;  Location: [facility];  Service: General;  Laterality: N/A;   orthopedic procedure     both feet   TRIGGER FINGER RELEASE     right hand     Family History  Problem Relation Age of Onset   Kidney disease Mother    Cirrhosis Mother    Lung cancer Father    Heart disease Maternal Grandmother    Colon cancer Neg Hx    Esophageal cancer Neg Hx    Rectal cancer Neg Hx    Stomach cancer Neg Hx    Pancreatic cancer Neg Hx      Social History   Socioeconomic History   Marital status: Divorced    Spouse name: Not on file   Number of children: 0   Years of education: Not on file   Highest education level: Not on file  Occupational History   Occupation: retired Armed forces logistics/support/administrative officer for [employer]  Tobacco Use   Smoking status: Never   Smokeless tobacco: Never  Substance and  Sexual Activity   Alcohol use: No   Drug use: No   Sexual activity: Not on file  Other Topics Concern   Not on file  Social History Narrative   Not on file   Social Determinants of Health   Financial Resource Strain: Not on file  Food Insecurity: No Food Insecurity (10/17/2022)   Hunger Vital Sign    Worried About Running Out of Food in the Last Year: Never true    Ran Out of Food in the Last Year: Never true  Transportation Needs: No Transportation Needs (10/17/2022)   PRAPARE - Administrator, Civil Service (Medical): No    Lack of Transportation (Non-Medical): No  Physical Activity: Not on file  Stress: Not on file  Social Connections: Not on file  Intimate Partner Violence: Not At Risk (10/17/2022)   Humiliation, Afraid, Rape, and Kick questionnaire    Fear of Current or Ex-Partner: No    Emotionally Abused: No    Physically Abused: No    Sexually Abused: No     BP 136/64 (BP Location: Left Arm, Patient Position: Sitting, Cuff Size: Normal)   Pulse (!) 56   Ht 4\' 10"  (1.473 m)   Wt 112 lb 12.8 oz (51.2 kg)   SpO2 97%   BMI 23.58 kg/m   Physical Exam:  Well appearing 82 yo woman, NAD HEENT: Unremarkable Neck:  No JVD, no thyromegally Lymphatics:  No adenopathy Back:  No CVA tenderness Lungs:  Clear with no wheezes HEART:  Regular rate rhythm, no murmurs, no rubs, no clicks Abd:  soft, positive bowel sounds, no organomegally, no rebound, no guarding Ext:  2 plus pulses, no edema, no cyanosis, no clubbing Skin:  No rashes no nodules Neuro:  CN II through XII intact, motor grossly intact    Assess/Plan:  Palpitations - these are well controlled on low dose flecainide. She will continue. 2. PVC's - well controlled. We will follow. 3. HTN - her sbp is well controlled in the setting of weight loss and she is now a little dizzy and I have asked her to reduce her calan to 1/2 tab daily. 4. Weight gain - she has now gained back 12 lbs from her low. She  will continue her current routine.   Kirsten Gowda Gorden Stthomas,MD

## 2022-12-23 NOTE — Patient Instructions (Signed)

## 2023-02-01 ENCOUNTER — Ambulatory Visit: Payer: Medicare Other | Admitting: Podiatry

## 2023-02-03 ENCOUNTER — Ambulatory Visit: Payer: Medicare Other | Admitting: Podiatry

## 2023-02-24 DIAGNOSIS — M65332 Trigger finger, left middle finger: Secondary | ICD-10-CM | POA: Diagnosis not present

## 2023-03-03 ENCOUNTER — Ambulatory Visit: Payer: Medicare Other | Admitting: Podiatry

## 2023-03-03 ENCOUNTER — Other Ambulatory Visit: Payer: Self-pay | Admitting: Internal Medicine

## 2023-03-03 DIAGNOSIS — I493 Ventricular premature depolarization: Secondary | ICD-10-CM

## 2023-03-08 DIAGNOSIS — E785 Hyperlipidemia, unspecified: Secondary | ICD-10-CM | POA: Diagnosis not present

## 2023-03-08 DIAGNOSIS — I10 Essential (primary) hypertension: Secondary | ICD-10-CM | POA: Diagnosis not present

## 2023-03-08 DIAGNOSIS — R7302 Impaired glucose tolerance (oral): Secondary | ICD-10-CM | POA: Diagnosis not present

## 2023-03-08 DIAGNOSIS — E039 Hypothyroidism, unspecified: Secondary | ICD-10-CM | POA: Diagnosis not present

## 2023-03-08 DIAGNOSIS — E559 Vitamin D deficiency, unspecified: Secondary | ICD-10-CM | POA: Diagnosis not present

## 2023-03-08 DIAGNOSIS — M8580 Other specified disorders of bone density and structure, unspecified site: Secondary | ICD-10-CM | POA: Diagnosis not present

## 2023-03-17 DIAGNOSIS — G250 Essential tremor: Secondary | ICD-10-CM | POA: Diagnosis not present

## 2023-03-17 DIAGNOSIS — Z1331 Encounter for screening for depression: Secondary | ICD-10-CM | POA: Diagnosis not present

## 2023-03-17 DIAGNOSIS — I272 Pulmonary hypertension, unspecified: Secondary | ICD-10-CM | POA: Diagnosis not present

## 2023-03-17 DIAGNOSIS — Z8719 Personal history of other diseases of the digestive system: Secondary | ICD-10-CM | POA: Diagnosis not present

## 2023-03-17 DIAGNOSIS — K5909 Other constipation: Secondary | ICD-10-CM | POA: Diagnosis not present

## 2023-03-17 DIAGNOSIS — R7302 Impaired glucose tolerance (oral): Secondary | ICD-10-CM | POA: Diagnosis not present

## 2023-03-17 DIAGNOSIS — Z1339 Encounter for screening examination for other mental health and behavioral disorders: Secondary | ICD-10-CM | POA: Diagnosis not present

## 2023-03-17 DIAGNOSIS — Z23 Encounter for immunization: Secondary | ICD-10-CM | POA: Diagnosis not present

## 2023-03-17 DIAGNOSIS — J449 Chronic obstructive pulmonary disease, unspecified: Secondary | ICD-10-CM | POA: Diagnosis not present

## 2023-03-17 DIAGNOSIS — I700 Atherosclerosis of aorta: Secondary | ICD-10-CM | POA: Diagnosis not present

## 2023-03-17 DIAGNOSIS — Z0000 Encounter for general adult medical examination without abnormal findings: Secondary | ICD-10-CM | POA: Diagnosis not present

## 2023-03-17 DIAGNOSIS — I10 Essential (primary) hypertension: Secondary | ICD-10-CM | POA: Diagnosis not present

## 2023-03-17 DIAGNOSIS — E039 Hypothyroidism, unspecified: Secondary | ICD-10-CM | POA: Diagnosis not present

## 2023-03-17 DIAGNOSIS — E785 Hyperlipidemia, unspecified: Secondary | ICD-10-CM | POA: Diagnosis not present

## 2023-03-17 DIAGNOSIS — R82998 Other abnormal findings in urine: Secondary | ICD-10-CM | POA: Diagnosis not present

## 2023-03-17 DIAGNOSIS — R002 Palpitations: Secondary | ICD-10-CM | POA: Diagnosis not present

## 2023-03-17 DIAGNOSIS — G4733 Obstructive sleep apnea (adult) (pediatric): Secondary | ICD-10-CM | POA: Diagnosis not present

## 2023-03-21 ENCOUNTER — Other Ambulatory Visit: Payer: Self-pay | Admitting: Internal Medicine

## 2023-03-21 DIAGNOSIS — Z1231 Encounter for screening mammogram for malignant neoplasm of breast: Secondary | ICD-10-CM

## 2023-03-22 ENCOUNTER — Encounter: Payer: Self-pay | Admitting: Podiatry

## 2023-03-22 ENCOUNTER — Ambulatory Visit (INDEPENDENT_AMBULATORY_CARE_PROVIDER_SITE_OTHER): Payer: Medicare Other | Admitting: Podiatry

## 2023-03-22 DIAGNOSIS — D2372 Other benign neoplasm of skin of left lower limb, including hip: Secondary | ICD-10-CM | POA: Diagnosis not present

## 2023-03-22 DIAGNOSIS — M7752 Other enthesopathy of left foot: Secondary | ICD-10-CM

## 2023-03-23 NOTE — Progress Notes (Signed)
She presents today chief complaint of a painful callus to the plantar aspect of the left foot.  She states that is right here she points beneath the first metatarsal head of the left foot.  Objective: Vital signs are stable she is alert and oriented x 3.  Pulses are palpable.  There is no erythema edema cellulitis drainage or odor no open lesions or wounds.  Benign skin lesion with significant fat atrophy resulting in significant pain on palpation beneath the base of the proximal phalanx of the hallux.  Assessment: Benign skin lesion plantar hallux base.  Plan: Debridement of benign skin lesion follow-up with her on an as-needed basis.  Placed padding.

## 2023-03-24 DIAGNOSIS — Z4789 Encounter for other orthopedic aftercare: Secondary | ICD-10-CM | POA: Diagnosis not present

## 2023-03-24 DIAGNOSIS — M65332 Trigger finger, left middle finger: Secondary | ICD-10-CM | POA: Diagnosis not present

## 2023-04-21 ENCOUNTER — Ambulatory Visit: Payer: Medicare Other

## 2023-05-09 ENCOUNTER — Emergency Department (HOSPITAL_COMMUNITY)

## 2023-05-09 ENCOUNTER — Other Ambulatory Visit: Payer: Self-pay

## 2023-05-09 ENCOUNTER — Encounter (HOSPITAL_COMMUNITY): Payer: Self-pay | Admitting: Emergency Medicine

## 2023-05-09 ENCOUNTER — Telehealth: Payer: Self-pay | Admitting: Internal Medicine

## 2023-05-09 ENCOUNTER — Emergency Department (HOSPITAL_COMMUNITY)
Admission: EM | Admit: 2023-05-09 | Discharge: 2023-05-09 | Disposition: A | Discharge status: Home / Self Care | Attending: Emergency Medicine | Admitting: Emergency Medicine

## 2023-05-09 DIAGNOSIS — R42 Dizziness and giddiness: Secondary | ICD-10-CM | POA: Insufficient documentation

## 2023-05-09 DIAGNOSIS — J449 Chronic obstructive pulmonary disease, unspecified: Secondary | ICD-10-CM | POA: Diagnosis not present

## 2023-05-09 DIAGNOSIS — Z79899 Other long term (current) drug therapy: Secondary | ICD-10-CM | POA: Insufficient documentation

## 2023-05-09 DIAGNOSIS — I10 Essential (primary) hypertension: Secondary | ICD-10-CM | POA: Insufficient documentation

## 2023-05-09 DIAGNOSIS — Z7982 Long term (current) use of aspirin: Secondary | ICD-10-CM | POA: Insufficient documentation

## 2023-05-09 DIAGNOSIS — R0789 Other chest pain: Secondary | ICD-10-CM | POA: Diagnosis not present

## 2023-05-09 DIAGNOSIS — I700 Atherosclerosis of aorta: Secondary | ICD-10-CM | POA: Diagnosis not present

## 2023-05-09 DIAGNOSIS — R079 Chest pain, unspecified: Secondary | ICD-10-CM | POA: Diagnosis not present

## 2023-05-09 LAB — BASIC METABOLIC PANEL
Anion gap: 8 (ref 5–15)
BUN: 28 mg/dL — ABNORMAL HIGH (ref 8–23)
CO2: 22 mmol/L (ref 22–32)
Calcium: 8.5 mg/dL — ABNORMAL LOW (ref 8.9–10.3)
Chloride: 104 mmol/L (ref 98–111)
Creatinine, Ser: 0.93 mg/dL (ref 0.44–1.00)
GFR, Estimated: >60 mL/min (ref >60)
Glucose, Bld: 82 mg/dL (ref 70–99)
Potassium: 3.8 mmol/L (ref 3.5–5.1)
Sodium: 134 mmol/L — ABNORMAL LOW (ref 135–145)

## 2023-05-09 LAB — CBC
HCT: 39.5 % (ref 36.0–46.0)
Hemoglobin: 12.4 g/dL (ref 12.0–15.0)
MCH: 27 pg (ref 26.0–34.0)
MCHC: 31.4 g/dL (ref 30.0–36.0)
MCV: 85.9 fL (ref 80.0–100.0)
Platelets: 271 10*3/uL (ref 150–400)
RBC: 4.6 MIL/uL (ref 3.87–5.11)
RDW: 14.9 % (ref 11.5–15.5)
WBC: 6.4 10*3/uL (ref 4.0–10.5)
nRBC: 0 % (ref 0.0–0.2)

## 2023-05-09 LAB — TROPONIN I (HIGH SENSITIVITY): Troponin I (High Sensitivity): 6 ng/L (ref <18)

## 2023-05-09 NOTE — Telephone Encounter (Signed)
 Returned call to patient. She reports she feels dizzy all the time, she's "walking sideways" and almost fell while on the phone with me.  She reports BP 183/63, HR ranging 51-99.  She also reports intermittent chest pain that feels like "a toothache", she also described it as a squeezing sensation, "like someone squeezing your hand shut." She states she has never had a sensation like this before.  Patient reports this all began last week around Tuesday 3/11. Patient also has history of COPD, she is experiencing SOB.  Reports drinking about 50 oz of water per day.  Patient previously advised by triage nurse today to go to ED for evaluation. She states she did not go because she has her dog in daycare and would need to make arrangements to care for her dog while she goes to ED. Advised patient to either go to an Urgent Care of ED for evaluation. Patient states she will make arrangements for her dog and go to [facility] ED.

## 2023-05-09 NOTE — Telephone Encounter (Signed)
 Pt was returning call to nurse and is requesting a callback. Please advise.

## 2023-05-09 NOTE — Telephone Encounter (Signed)
 Left message to call back

## 2023-05-09 NOTE — ED Triage Notes (Addendum)
 Patient presents due to chest pain that travels into right side of her jaw and her right shoulder for a week. She also complains of dizziness, nausea and shortness of breath.

## 2023-05-09 NOTE — Telephone Encounter (Signed)
 Patient called to follow up

## 2023-05-09 NOTE — ED Provider Notes (Signed)
 [place] EMERGENCY DEPARTMENT AT [facility] Provider Note   CSN: [id number] Arrival date & time: 05/09/23  1612     History  Chief Complaint  Patient presents with   Chest Pain   Dizziness    Kirsten [name] is a 83 y.o. female.  With a history of hypertension COPD presents to the ED for chest pain.  She has experienced 1 week of episodic chest pain localized over the substernal region with radiation to right upper extremity.  Some associated dizziness as well.  No nausea vomiting fevers or chills.  No reported shortness of breath.  No prior history of similar episodes of chest pain   Chest Pain Associated symptoms: dizziness   Dizziness Associated symptoms: chest pain        Home Medications Prior to Admission medications   Medication Sig Start Date End Date Taking? Authorizing Provider  albuterol (VENTOLIN HFA) 108 (90 Base) MCG/ACT inhaler Inhale 2 puffs into the lungs every 6 (six) hours as needed for wheezing.    [provider]  aspirin 81 MG tablet Take 81 mg by mouth daily.    [provider]  Biotin 5000 MCG TABS Take 5,000 mcg by mouth daily. \    [provider]  CINNAMON PO Take 1,000 mg by mouth daily.    [provider]  estradiol (ESTRACE) 0.1 MG/GM vaginal cream Place vaginally. 12/09/22   [provider]  estrogens, conjugated, (PREMARIN) 0.9 MG tablet Take 0.9 mg by mouth as directed. Take daily for 21 days then do not take for 7 days.    [provider]  flecainide (TAMBOCOR) 50 MG tablet TAKE 1 TABLET BY MOUTH TWICE A DAY AND MAY TAKE 1 ADDITIONAL TABLET AS NEEDED FOR FAST HEART BEAT 03/03/23   Marinus Maw, MD  Glucosamine-Chondroitin (OSTEO BI-FLEX REGULAR STRENGTH PO) Take 1 tablet by mouth daily.    [provider]  hydrochlorothiazide (HYDRODIURIL) 25 MG tablet Take 1 tablet by mouth daily.    [provider]  LINZESS 72 MCG capsule 1 capsule at least 30 minutes before  the first meal of the day on an empty stomach Orally Once a day for 30 days 03/17/23   [provider]  polyethylene glycol powder (MIRALAX) 17 GM/SCOOP powder Take 17 g by mouth 2 (two) times daily as needed for mild constipation. 10/22/22   Almon Hercules, MD  primidone (MYSOLINE) 50 MG tablet Take 50 mg by mouth at bedtime. 04/08/21   [provider]  rosuvastatin (CRESTOR) 10 MG tablet Take 1 tablet by mouth daily. 08/21/17   [provider]  SYNTHROID 75 MCG tablet Take 1 tablet by mouth daily. 08/21/17   [provider]  tiotropium (SPIRIVA HANDIHALER) 18 MCG inhalation capsule PLACE 1 CAPSULE INTO INHALER AND INHALE DAILY. Patient taking differently: 18 mcg daily as needed (Shortness of breath). PLACE 1 CAPSULE INTO INHALER AND INHALE DAILY. 12/26/17   [name], MD  triamcinolone cream (KENALOG) 0.1 % Apply 1 Application topically 2 (two) times daily. 08/24/22   [provider]  verapamil (CALAN-SR) 120 MG CR tablet Take 0.5 tablets (60 mg total) by mouth at bedtime. Patient taking differently: Take 120 mg by mouth at bedtime. 09/29/21   Marinus Maw, MD  Vitamin D, Ergocalciferol, (DRISDOL) 50000 UNITS CAPS Take 50,000 Units by mouth. Every other week on sunday    [provider]  zolpidem (AMBIEN) 10 MG tablet Take 10 mg by mouth at bedtime  as needed for sleep.     [provider]      Allergies    Patient has no known allergies.    Review of Systems   Review of Systems  Cardiovascular:  Positive for chest pain.  Neurological:  Positive for dizziness.    Physical Exam Updated Vital Signs BP (!) 162/70 (BP Location: Left Arm)   Pulse (!) 52   Temp 98 F (36.7 C) (Oral)   Resp (!) 22   SpO2 95%  Physical Exam Vitals and nursing note reviewed.  HENT:     Head: Normocephalic and atraumatic.  Eyes:     Pupils: Pupils are equal, round, and reactive to light.  Cardiovascular:     Rate and Rhythm: Normal rate and  regular rhythm.  Pulmonary:     Effort: Pulmonary effort is normal.     Breath sounds: Normal breath sounds.  Abdominal:     Palpations: Abdomen is soft.     Tenderness: There is no abdominal tenderness.  Musculoskeletal:     Right lower leg: No edema.     Left lower leg: No edema.  Skin:    General: Skin is warm and dry.  Neurological:     Mental Status: She is alert.  Psychiatric:        Mood and Affect: Mood normal.     ED Results / Procedures / Treatments   Labs (all labs ordered are listed, but only abnormal results are displayed) Labs Reviewed  BASIC METABOLIC PANEL - Abnormal; Notable for the following components:      Result Value   Sodium 134 (*)    BUN 28 (*)    Calcium 8.5 (*)    All other components within normal limits  CBC  TROPONIN I (HIGH SENSITIVITY)    EKG EKG Interpretation Date/Time:  Monday May 09 2023 21:13:08 EDT Ventricular Rate:  72 PR Interval:  199 QRS Duration:  95 QT Interval:  440 QTC Calculation: 482 R Axis:   -20  Text Interpretation: Sinus rhythm Consider left atrial enlargement Probable left ventricular hypertrophy Confirmed by Estelle June [phone]) on 05/09/2023 10:45:46 PM  Radiology DG Chest 2 View Result Date: 05/09/2023 CLINICAL DATA:  Pain EXAM: CHEST - 2 VIEW COMPARISON:  09/21/2017 FINDINGS: The heart size and mediastinal contours are within normal limits. Aortic atherosclerosis. No focal airspace consolidation, pleural effusion, or pneumothorax. The visualized skeletal structures are unremarkable. IMPRESSION: No active cardiopulmonary disease. Electronically Signed   By: Duanne Guess D.O.   On: 05/09/2023 18:43    Procedures Procedures    Medications Ordered in ED Medications - No data to display  ED Course/ Medical Decision Making/ A&P Clinical Course as of 05/09/23 2249  Mon May 09, 2023  2246 High-sensitivity troponin of 6 not consistent with ACS.  No ischemic changes on EKG.  CBC metabolic panel  unremarkable overall.  Chest x-ray shows no focal consolidation.  Unclear cause of her 1 week of chest discomfort but do not feel as though she needs any more testing here tonight.  She remained stable on room air slightly hypertensive.  She will follow-up with her primary care doctor and understands return precautions [MP]    Clinical Course User Index [MP] Royanne Foots, DO                                 Medical Decision Making 83 year old female with history as above presenting  for evaluation of chest pain and dizziness.  Afebrile slightly hypertensive on exam.  Benign physical exam.  Differential diagnosis includes ACS, dysrhythmia, pneumonia, viral respiratory illness, electrolyte imbalance and anemia.  Low suspicion for acute aortic dissection or PE at this time given her overall well appearance.  Stable on room air.  Will obtain laboratory workup EKG chest x-ray and continue to monitor on telemetry  Amount and/or Complexity of Data Reviewed Labs: ordered. Radiology: ordered.           Final Clinical Impression(s) / ED Diagnoses Final diagnoses:  Chest wall pain    Rx / DC Orders ED Discharge Orders     None         Royanne Foots, DO 05/09/23 2249

## 2023-05-09 NOTE — Telephone Encounter (Signed)
 Patient is following up to report BP readings. BP was 183/63 for the first reading and 180/67 a few minutes later.

## 2023-05-09 NOTE — Telephone Encounter (Signed)
 Spoke with patient and she states she has been have been having chest pain. States it feels like its being squeezed really tight. She also has been really dizzy. Has COPD so she feel she is SOB all the time. Can not check Bo but her HR currently 61  Advised patient to call 911 and go to the ED

## 2023-05-09 NOTE — Telephone Encounter (Signed)
 STAT if patient feels like he/she is going to faint   Are you dizzy, lightheaded, or faint now?  Dizziness ongoing since 3/12  Have you passed out?  No   Do you have any other symptoms?  Shakiness, chest tightness since Tuesday   Have you checked your HR and BP (record if available)?  HR been ranging from about 49-110 Hasn't been checking BP

## 2023-05-09 NOTE — Discharge Instructions (Signed)
 You were seen in the emergency room for chest pain Your blood work EKG and chest x-ray all looked okay We do not think this is related to a heart attack at this time but is importantly follow-up with your primary doctor within 1 week for reevaluation Return to the Emergency Department for severe pain trouble breathing or any other concerns

## 2023-05-11 ENCOUNTER — Ambulatory Visit: Payer: Medicare Other

## 2023-05-11 DIAGNOSIS — I10 Essential (primary) hypertension: Secondary | ICD-10-CM | POA: Diagnosis not present

## 2023-05-11 DIAGNOSIS — J449 Chronic obstructive pulmonary disease, unspecified: Secondary | ICD-10-CM | POA: Diagnosis not present

## 2023-05-11 DIAGNOSIS — Z8719 Personal history of other diseases of the digestive system: Secondary | ICD-10-CM | POA: Diagnosis not present

## 2023-05-11 DIAGNOSIS — E785 Hyperlipidemia, unspecified: Secondary | ICD-10-CM | POA: Diagnosis not present

## 2023-05-11 DIAGNOSIS — R42 Dizziness and giddiness: Secondary | ICD-10-CM | POA: Diagnosis not present

## 2023-05-11 DIAGNOSIS — R0789 Other chest pain: Secondary | ICD-10-CM | POA: Diagnosis not present

## 2023-05-11 DIAGNOSIS — H6121 Impacted cerumen, right ear: Secondary | ICD-10-CM | POA: Diagnosis not present

## 2023-05-11 NOTE — Telephone Encounter (Signed)
 We cannot treat squeezing chest pain remotely. ER eval is indicated.

## 2023-05-13 NOTE — Telephone Encounter (Signed)
 Pt was seen in ED 3/17

## 2023-06-21 ENCOUNTER — Ambulatory Visit (INDEPENDENT_AMBULATORY_CARE_PROVIDER_SITE_OTHER): Payer: Medicare Other | Admitting: Podiatry

## 2023-06-21 ENCOUNTER — Encounter: Payer: Self-pay | Admitting: Podiatry

## 2023-06-21 DIAGNOSIS — M7752 Other enthesopathy of left foot: Secondary | ICD-10-CM

## 2023-06-21 DIAGNOSIS — D2372 Other benign neoplasm of skin of left lower limb, including hip: Secondary | ICD-10-CM | POA: Diagnosis not present

## 2023-06-21 MED ORDER — DEXAMETHASONE SODIUM PHOSPHATE 120 MG/30ML IJ SOLN
2.0000 mg | Freq: Once | INTRAMUSCULAR | Status: AC
Start: 2023-06-21 — End: 2023-06-21
  Administered 2023-06-21: 2 mg via INTRA_ARTICULAR

## 2023-06-21 NOTE — Progress Notes (Signed)
 She presents today states that this area appears just killing me as she refers to her callus subfifth met head left.  Objective: Vital signs are stable alert and oriented x 3.  Pulses are palpable.  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  Assessment: Bursitis fifth met left met head.  Painful benign skin lesion.  Plan: Debridement of benign skin lesion I injected the bursa today with dexamethasone  local anesthetic tolerated procedure well without complications follow-up with her in 2 months

## 2023-06-28 ENCOUNTER — Ambulatory Visit

## 2023-07-13 ENCOUNTER — Ambulatory Visit
Admission: RE | Admit: 2023-07-13 | Discharge: 2023-07-13 | Disposition: A | Discharge status: Home / Self Care | Source: Ambulatory Visit | Attending: Internal Medicine | Admitting: Internal Medicine

## 2023-07-13 DIAGNOSIS — Z1231 Encounter for screening mammogram for malignant neoplasm of breast: Secondary | ICD-10-CM

## 2023-07-29 DIAGNOSIS — R3915 Urgency of urination: Secondary | ICD-10-CM | POA: Diagnosis not present

## 2023-07-29 DIAGNOSIS — R350 Frequency of micturition: Secondary | ICD-10-CM | POA: Diagnosis not present

## 2023-07-29 DIAGNOSIS — R300 Dysuria: Secondary | ICD-10-CM | POA: Diagnosis not present

## 2023-07-29 DIAGNOSIS — N390 Urinary tract infection, site not specified: Secondary | ICD-10-CM | POA: Diagnosis not present

## 2023-08-19 DIAGNOSIS — N390 Urinary tract infection, site not specified: Secondary | ICD-10-CM | POA: Diagnosis not present

## 2023-08-25 DIAGNOSIS — L089 Local infection of the skin and subcutaneous tissue, unspecified: Secondary | ICD-10-CM | POA: Diagnosis not present

## 2023-08-25 DIAGNOSIS — S61052A Open bite of left thumb without damage to nail, initial encounter: Secondary | ICD-10-CM | POA: Diagnosis not present

## 2023-08-25 DIAGNOSIS — W540XXA Bitten by dog, initial encounter: Secondary | ICD-10-CM | POA: Diagnosis not present

## 2023-08-30 DIAGNOSIS — W540XXA Bitten by dog, initial encounter: Secondary | ICD-10-CM | POA: Diagnosis not present

## 2023-08-30 DIAGNOSIS — S61052A Open bite of left thumb without damage to nail, initial encounter: Secondary | ICD-10-CM | POA: Diagnosis not present

## 2023-08-30 DIAGNOSIS — L089 Local infection of the skin and subcutaneous tissue, unspecified: Secondary | ICD-10-CM | POA: Diagnosis not present

## 2023-09-22 ENCOUNTER — Ambulatory Visit: Admitting: Podiatry

## 2023-09-27 ENCOUNTER — Ambulatory Visit: Admitting: Podiatry

## 2023-10-11 ENCOUNTER — Ambulatory Visit (INDEPENDENT_AMBULATORY_CARE_PROVIDER_SITE_OTHER): Admitting: Podiatry

## 2023-10-11 ENCOUNTER — Encounter: Payer: Self-pay | Admitting: Podiatry

## 2023-10-11 ENCOUNTER — Other Ambulatory Visit: Payer: Self-pay | Admitting: Internal Medicine

## 2023-10-11 DIAGNOSIS — D2372 Other benign neoplasm of skin of left lower limb, including hip: Secondary | ICD-10-CM | POA: Diagnosis not present

## 2023-10-11 DIAGNOSIS — Z8719 Personal history of other diseases of the digestive system: Secondary | ICD-10-CM | POA: Insufficient documentation

## 2023-10-11 DIAGNOSIS — L089 Local infection of the skin and subcutaneous tissue, unspecified: Secondary | ICD-10-CM | POA: Insufficient documentation

## 2023-10-11 DIAGNOSIS — E440 Moderate protein-calorie malnutrition: Secondary | ICD-10-CM | POA: Insufficient documentation

## 2023-10-11 DIAGNOSIS — Z0000 Encounter for general adult medical examination without abnormal findings: Secondary | ICD-10-CM | POA: Insufficient documentation

## 2023-10-11 DIAGNOSIS — M19072 Primary osteoarthritis, left ankle and foot: Secondary | ICD-10-CM | POA: Diagnosis not present

## 2023-10-11 DIAGNOSIS — M8580 Other specified disorders of bone density and structure, unspecified site: Secondary | ICD-10-CM | POA: Insufficient documentation

## 2023-10-11 DIAGNOSIS — N816 Rectocele: Secondary | ICD-10-CM | POA: Insufficient documentation

## 2023-10-11 DIAGNOSIS — Z811 Family history of alcohol abuse and dependence: Secondary | ICD-10-CM | POA: Insufficient documentation

## 2023-10-11 DIAGNOSIS — M25511 Pain in right shoulder: Secondary | ICD-10-CM | POA: Insufficient documentation

## 2023-10-11 DIAGNOSIS — I493 Ventricular premature depolarization: Secondary | ICD-10-CM

## 2023-10-11 MED ORDER — DEXAMETHASONE SODIUM PHOSPHATE 120 MG/30ML IJ SOLN
2.0000 mg | Freq: Once | INTRAMUSCULAR | Status: AC
Start: 2023-10-11 — End: 2023-10-11
  Administered 2023-10-11: 2 mg via INTRA_ARTICULAR

## 2023-10-11 NOTE — Progress Notes (Signed)
 She presents today states that this area appears just killing me as she refers to her callus subfifth met head left.  Objective: Vital signs are stable alert and oriented x 3.  Pulses are palpable.  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  Assessment: Bursitis fifth met left met head.  Painful benign skin lesion.  Plan: Debridement of benign skin lesion I injected the bursa today with dexamethasone  local anesthetic tolerated procedure well without complications follow-up with her in 2 months

## 2023-10-13 ENCOUNTER — Other Ambulatory Visit: Payer: Self-pay | Admitting: Internal Medicine

## 2023-10-13 DIAGNOSIS — I493 Ventricular premature depolarization: Secondary | ICD-10-CM

## 2023-10-14 ENCOUNTER — Telehealth: Payer: Self-pay | Admitting: Internal Medicine

## 2023-10-14 DIAGNOSIS — I493 Ventricular premature depolarization: Secondary | ICD-10-CM

## 2023-10-14 MED ORDER — FLECAINIDE ACETATE 50 MG PO TABS: ORAL_TABLET | ORAL | 1 refills | Status: DC

## 2023-10-14 NOTE — Telephone Encounter (Signed)
*  STAT* If patient is at the pharmacy, call can be transferred to refill team.   1. Which medications need to be refilled? (please list name of each medication and dose if known) flecainide  (TAMBOCOR ) 50 MG tablet    2. Would you like to learn more about the convenience, safety, & potential cost savings by using the [facility]?     3. Are you open to using the [facility] (Type [facility].  ).   4. Which pharmacy/location (including street and city if local pharmacy) is medication to be sent to? [facility] [id number] - [place], [place] - [address]    5. Do they need a 30 day or 90 day supply? 90 day

## 2023-10-14 NOTE — Telephone Encounter (Signed)
 Pt's medication was sent to pt's pharmacy as requested. Confirmation received.

## 2023-10-28 ENCOUNTER — Telehealth: Payer: Self-pay | Admitting: Internal Medicine

## 2023-10-28 NOTE — Telephone Encounter (Signed)
 Spoke with Pt. Pt stated she hasn't took 3rd pill as she was concerned about timing. Advised pt that she should try to take her first pill in the am earlier than she has been (prior 11am) and if mid day she felt bad to take the extra pill. Advised that if she felt she was taking the 3rd pill half the week or more to call me back and we could talk to Dr Waddell about adjustments. Pt stated understanding.

## 2023-10-28 NOTE — Telephone Encounter (Signed)
 Pt c/o medication issue:  1. Name of Medication:   flecainide  (TAMBOCOR ) 50 MG tablet    2. How are you currently taking this medication (dosage and times per day)?   3. Are you having a reaction (difficulty breathing--STAT)?   4. What is your medication issue?   Patient would like to discuss increasing Flecainide  if possible. She says when she first started on it Dr. Waddell advised that she could take up to 3 tablets daily, but it isn't written that way. Please advise.

## 2023-10-31 NOTE — Telephone Encounter (Signed)
 STAT if HR is under 50 or over 120  (normal HR is 60-100 beats per minute)  What is your heart rate? 70-90 stays around 70  Do you have a log of your heart rate readings (document readings)? No, her BP was 130/70 this morning Do you have any other symptoms? SOB and Lightheadedness (when working around the house)  Patient stated she is not feeling better since the increased dosage Fledcainide. Please advise.  Patient requested for us  to call her at [phone].

## 2023-10-31 NOTE — Telephone Encounter (Signed)
 Pt is still not feeling well with extra dose of flecainide . Pt states she is having some SOB (was not SOB on phone) and is having some dizziness in the afternoons. Most symptoms seem to be midday. Appt set for 9/15 as she has other appts this week. Will route to Daphne Barrack for upcoming appt. 911/ED precaustions reviewed and pt stated understanding.

## 2023-11-05 NOTE — Progress Notes (Signed)
 Electrophysiology Office Note:   Date:  11/07/2023  ID:  Kirsten [name], DOB 18-Jun-1940, MRN [id number]  Primary Cardiologist: Danelle Birmingham, MD Primary Heart Failure: None Electrophysiologist: Danelle Birmingham, MD      History of Present Illness:   Kirsten [name] is a 83 y.o. female with h/o PVC's with palpitations, HTN, OSA, COPD on flecainide  seen today for routine electrophysiology followup.   Pt called the clinic on 10/31/23 with reports of shortness of breath and dizziness in the afternoons.    Since last being seen in our clinic the patient reports doing she feels terrible all day long. She reports dizziness, weakness, her chest pounds, feels as thought she has butterflies in her chest at times, she gets short of breath with activity like she has run a race.  She states she does 10-14k steps per day but also just gave her dog away as she could no longer walk it. She states she has to lie down every 15 minutes because she feels so poorly.  She denies feeling like she needs to go to the ER.  She states the pounding in her chest is worse at night > it wakes her up despite taking an Ambien.    She denies chest pain,  PND, orthopnea, nausea, vomiting, dizziness, syncope, edema, weight gain, or early satiety.   Review of systems complete and found to be negative unless listed in HPI.   EP Information / Studies Reviewed:    EKG is ordered today. Personal review as below.  EKG Interpretation Date/Time:  Monday November 07 2023 08:19:12 EDT Ventricular Rate:  51 PR Interval:  190 QRS Duration:  84 QT Interval:  480 QTC Calculation: 442 R Axis:   -7  Text Interpretation: Sinus bradycardia Confirmed by Aniceto Jarvis ([id number]) on 11/07/2023 8:35:58 AM    Arrhythmia / AAD / Pertinent EP Studies PVC Flecainide  > initiated ~ 2012  Risk Assessment/Calculations:              Physical Exam:   VS:  BP 138/72   Pulse (!) 51   Ht 4' 10 (1.473 m)   Wt 125 lb (56.7 kg)   SpO2 95%   BMI  26.13 kg/m    Wt Readings from Last 3 Encounters:  11/07/23 125 lb (56.7 kg)  12/23/22 112 lb 12.8 oz (51.2 kg)  10/19/22 109 lb 5.6 oz (49.6 kg)     GEN: elderly, well nourished, well developed in no acute distress NECK: No JVD; No carotid bruits CARDIAC: Regular rate and rhythm, no murmurs, rubs, gallops RESPIRATORY:  Clear to auscultation without rales, wheezing or rhonchi  ABDOMEN: Soft, non-tender, non-distended EXTREMITIES:  No edema; No deformity   ASSESSMENT AND PLAN:    Shortness of Breath  Dizziness / Weakness  -she states she is not great about getting enough fluid > encouraged adequate hydration, food, sleep and avoidance of alcohol   -no recent viral illnesses -CBC, BMP, TSH  -discussed differential for these symptoms are broad.  Work up starting with labs, ECHO and heart monitor > if those are negative, must consider if the flecainide  is causing her symptoms.  Rule out anemia, hypo/hyperthyroidism, electrolyte / renal disturbances.  Most recent CXR in 04/2023 normal on personal review. -encouraged her to discuss with her PCP as well  PVC's  Palpitations High Risk Medication Monitoring: Flecainide   -EKG with NSR, stable intervals   -continue flecainide  50 mg BID, ok to take one additional as needed for palptiations -assess cardiac monitor as above  -  update ECHO to ensure no change in EF with PVC's (though no PVC's on EKG in clinic)  Hypertension  -mildly elevated in clinic, monitor    Follow up with Dr. Waddell 2 months   Signed, Daphne Barrack, NP-C, AGACNP-BC [facility] - Electrophysiology  11/07/2023, 1:31 PM

## 2023-11-07 ENCOUNTER — Ambulatory Visit (INDEPENDENT_AMBULATORY_CARE_PROVIDER_SITE_OTHER)

## 2023-11-07 ENCOUNTER — Ambulatory Visit: Attending: Pulmonary Disease | Admitting: Pulmonary Disease

## 2023-11-07 ENCOUNTER — Encounter: Payer: Self-pay | Admitting: Pulmonary Disease

## 2023-11-07 VITALS — BP 138/72 | HR 51 | Ht <= 58 in | Wt 125.0 lb

## 2023-11-07 DIAGNOSIS — I493 Ventricular premature depolarization: Secondary | ICD-10-CM

## 2023-11-07 DIAGNOSIS — R42 Dizziness and giddiness: Secondary | ICD-10-CM

## 2023-11-07 DIAGNOSIS — Z5181 Encounter for therapeutic drug level monitoring: Secondary | ICD-10-CM | POA: Insufficient documentation

## 2023-11-07 DIAGNOSIS — R9431 Abnormal electrocardiogram [ECG] [EKG]: Secondary | ICD-10-CM | POA: Insufficient documentation

## 2023-11-07 DIAGNOSIS — E039 Hypothyroidism, unspecified: Secondary | ICD-10-CM | POA: Insufficient documentation

## 2023-11-07 NOTE — Progress Notes (Unsigned)
Applied a 14 day Zio XT monitor to patient in the office  Kirsten [name] to read

## 2023-11-07 NOTE — Patient Instructions (Addendum)
 Medication Instructions:   Your physician recommends that you continue on your current medications as directed. Please refer to the Current Medication list given to you today.  *If you need a refill on your cardiac medications before your next appointment, please call your pharmacy*   Lab Work:   PLEASE GO DOWN STAIRS  [facility]  FIRST FLOOR   ( GET OFF ELEVATORS WALK TOWARDS WAITING AREA LAB LOCATED BY PHARMACY):  BMET  CBC AND TSH TODAY     If you have labs (blood work) drawn today and your tests are completely normal, you will receive your results only by: MyChart Message (if you have MyChart) OR A paper copy in the mail If you have any lab test that is abnormal or we need to change your treatment, we will call you to review the results.   Testing/Procedures: Your physician has requested that you have an echocardiogram. Echocardiography is a painless test that uses sound waves to create images of your heart. It provides your doctor with information about the size and shape of your heart and how well your heart's chambers and valves are working. This procedure takes approximately one hour. There are no restrictions for this procedure. Please do NOT wear cologne, perfume, aftershave, or lotions (deodorant is allowed). Please arrive 15 minutes prior to your appointment time.  Please note: We ask at that you not bring children with you during ultrasound (echo/ vascular) testing. Due to room size and safety concerns, children are not allowed in the ultrasound rooms during exams. Our front office staff cannot provide observation of children in our lobby area while testing is being conducted. An adult accompanying a patient to their appointment will only be allowed in the ultrasound room at the discretion of the ultrasound technician under special circumstances. We apologize for any inconvenience.]  Your physician has recommended that you wear an event monitor. Event monitors are medical devices  that record the heart's electrical activity. Doctors most often us  these monitors to diagnose arrhythmias. Arrhythmias are problems with the speed or rhythm of the heartbeat. The monitor is a small, portable device. You can wear one while you do your normal daily activities. This is usually used to diagnose what is causing palpitations/syncope (passing out).   Follow-Up: At [facility], you and your health needs are our priority.  As part of our continuing mission to provide you with exceptional heart care, our providers are all part of one team.  This team includes your primary Cardiologist (physician) and Advanced Practice Providers or APPs (Physician Assistants and Nurse Practitioners) who all work together to provide you with the care you need, when you need it.   Your next appointment:     ONE TO 2 MONTHS WITH  Provider:   You may see Danelle Birmingham, MD  ( CONTACT  CASSIE HALL/ ANGELINE HAMMER FOR EP SCHEDULING ISSUES )    We recommend signing up for the patient portal called MyChart.  Sign up information is provided on this After Visit Summary.  MyChart is used to connect with patients for Virtual Visits (Telemedicine).  Patients are able to view lab/test results, encounter notes, upcoming appointments, etc.  Non-urgent messages can be sent to your provider as well.   To learn more about what you can do with MyChart, go to [email or web address].   Other Instructions  ZIO XT- Long Term Monitor Instructions  Your physician has requested you wear a ZIO patch monitor for 14 days.  This is a single  patch monitor. [employer] supplies one patch monitor per enrollment. Additional stickers are not available. Please do not apply patch if you will be having a Nuclear Stress Test,  Echocardiogram, Cardiac CT, MRI, or Chest Xray during the period you would be wearing the  monitor. The patch cannot be worn during these tests. You cannot remove and re-apply the  ZIO XT patch monitor.  Your  ZIO patch monitor will be mailed 3 day USPS to your address on file. It may take 3-5 days  to receive your monitor after you have been enrolled.  Once you have received your monitor, please review the enclosed instructions. Your monitor  has already been registered assigning a specific monitor serial # to you.  Billing and Patient Assistance Program Information  We have supplied [employer] with any of your insurance information on file for billing purposes. [employer] offers a sliding scale Patient Assistance Program for patients that do not have  insurance, or whose insurance does not completely cover the cost of the ZIO monitor.  You must apply for the Patient Assistance Program to qualify for this discounted rate.  To apply, please call [employer] at [phone], select option 4, select option 2, ask to apply for  Patient Assistance Program. Meredeth will ask your household income, and how many people  are in your household. They will quote your out-of-pocket cost based on that information.  [employer] will also be able to set up a 44-month, interest-free payment plan if needed.  Applying the monitor   Shave hair from upper left chest.  Hold abrader disc by orange tab. Rub abrader in 40 strokes over the upper left chest as  indicated in your monitor instructions.  Clean area with 4 enclosed alcohol  pads. Let dry.  Apply patch as indicated in monitor instructions. Patch will be placed under collarbone on left  side of chest with arrow pointing upward.  Rub patch adhesive wings for 2 minutes. Remove white label marked 1. Remove the white  label marked 2. Rub patch adhesive wings for 2 additional minutes.  While looking in a mirror, press and release button in center of patch. A small green light will  flash 3-4 times. This will be your only indicator that the monitor has been turned on.  Do not shower for the first 24 hours. You may shower after the first 24 hours.  Press the button if you feel  a symptom. You will hear a small click. Record Date, Time and  Symptom in the Patient Logbook.  When you are ready to remove the patch, follow instructions on the last 2 pages of Patient  Logbook. Stick patch monitor onto the last page of Patient Logbook.  Place Patient Logbook in the blue and white box. Use locking tab on box and tape box closed  securely. The blue and white box has prepaid postage on it. Please place it in the mailbox as  soon as possible. Your physician should have your test results approximately 7 days after the  monitor has been mailed back to [facility].  Call [facility] Customer Care at [phone] if you have questions regarding  your ZIO XT patch monitor. Call them immediately if you see an orange light blinking on your  monitor.  If your monitor falls off in less than 4 days, contact our Monitor department at [phone].  If your monitor becomes loose or falls off after 4 days call [employer] at [phone] for  suggestions on securing your monitor

## 2023-11-08 ENCOUNTER — Ambulatory Visit: Payer: Self-pay | Admitting: Pulmonary Disease

## 2023-11-08 LAB — BASIC METABOLIC PANEL WITH GFR
BUN/Creatinine Ratio: 29 — ABNORMAL HIGH (ref 12–28)
BUN: 27 mg/dL (ref 8–27)
CO2: 20 mmol/L (ref 20–29)
Calcium: 9.6 mg/dL (ref 8.7–10.3)
Chloride: 102 mmol/L (ref 96–106)
Creatinine, Ser: 0.93 mg/dL (ref 0.57–1.00)
Glucose: 80 mg/dL (ref 70–99)
Potassium: 4.1 mmol/L (ref 3.5–5.2)
Sodium: 136 mmol/L (ref 134–144)
eGFR: 61 mL/min/1.73 (ref >59)

## 2023-11-08 LAB — CBC
Hematocrit: 41.9 % (ref 34.0–46.6)
Hemoglobin: 13.6 g/dL (ref 11.1–15.9)
MCH: 28.2 pg (ref 26.6–33.0)
MCHC: 32.5 g/dL (ref 31.5–35.7)
MCV: 87 fL (ref 79–97)
Platelets: 264 x10E3/uL (ref 150–450)
RBC: 4.83 x10E6/uL (ref 3.77–5.28)
RDW: 14.1 % (ref 11.7–15.4)
WBC: 8.8 x10E3/uL (ref 3.4–10.8)

## 2023-11-08 LAB — TSH: TSH: 0.22 u[IU]/mL — ABNORMAL LOW (ref 0.450–4.500)

## 2023-11-28 DIAGNOSIS — R42 Dizziness and giddiness: Secondary | ICD-10-CM | POA: Diagnosis not present

## 2023-11-28 DIAGNOSIS — I493 Ventricular premature depolarization: Secondary | ICD-10-CM | POA: Diagnosis not present

## 2023-12-07 ENCOUNTER — Telehealth: Payer: Self-pay | Admitting: Internal Medicine

## 2023-12-07 NOTE — Telephone Encounter (Signed)
 Returned call to patient. She reports she has been non-functional of late.. with minimal exertion she gets wiped out. She reports palps about 24/7.   She takes flecainide  50mg  BID. She had previously taken PRN dose prior to appt with Brandi NP on 9/15 and it made her feel worse, did not change her symptoms. No recent PRN use.   She stays well hydrated.   She drinks <1 soda daily on average.   She sleeps a total of 10 hours of sleep off and on -- 10p to 8a  She reports she has h/o OSA - no CPAP. Had a dental device, but unable to wear successfully. She now has a brace to wear at night for grinding her teeth.   Advised I have routed message to [facility] NP -- will also send to Dr. Waddell

## 2023-12-07 NOTE — Telephone Encounter (Signed)
  Patient is calling to follow her hear monitor result. She said her heart palpitations been 24/7 and her her heart been flip flopping. Having issue with short of breath too. She would like to get recommendation from [facility]

## 2023-12-07 NOTE — Telephone Encounter (Signed)
 Left message to call back. Monitor report in epic, result note pending.   Routed to [facility] NP

## 2023-12-09 DIAGNOSIS — I493 Ventricular premature depolarization: Secondary | ICD-10-CM | POA: Diagnosis not present

## 2023-12-09 DIAGNOSIS — R42 Dizziness and giddiness: Secondary | ICD-10-CM | POA: Diagnosis not present

## 2023-12-12 ENCOUNTER — Other Ambulatory Visit: Payer: Self-pay | Admitting: *Deleted

## 2023-12-12 ENCOUNTER — Ambulatory Visit (HOSPITAL_COMMUNITY)
Admission: RE | Admit: 2023-12-12 | Discharge: 2023-12-12 | Disposition: A | Discharge status: Home / Self Care | Source: Ambulatory Visit | Attending: Cardiology | Admitting: Cardiology

## 2023-12-12 DIAGNOSIS — R9431 Abnormal electrocardiogram [ECG] [EKG]: Secondary | ICD-10-CM | POA: Insufficient documentation

## 2023-12-12 DIAGNOSIS — R0609 Other forms of dyspnea: Secondary | ICD-10-CM | POA: Diagnosis not present

## 2023-12-12 DIAGNOSIS — R011 Cardiac murmur, unspecified: Secondary | ICD-10-CM | POA: Diagnosis not present

## 2023-12-12 DIAGNOSIS — I493 Ventricular premature depolarization: Secondary | ICD-10-CM | POA: Insufficient documentation

## 2023-12-12 LAB — ECHOCARDIOGRAM COMPLETE
Area-P 1/2: 3.08 cm2
P 1/2 time: 288 ms
S' Lateral: 2.1 cm

## 2023-12-14 NOTE — Telephone Encounter (Signed)
 Pt has appt 11/10. Wore 14 day monitor.

## 2023-12-14 NOTE — Telephone Encounter (Signed)
 If palpitations are all the time then I would like her to wear a 3 day zio as she may have reverted to persistent atrial fib.

## 2024-01-02 ENCOUNTER — Other Ambulatory Visit (HOSPITAL_COMMUNITY): Payer: Self-pay

## 2024-01-02 ENCOUNTER — Encounter: Payer: Self-pay | Admitting: Internal Medicine

## 2024-01-02 ENCOUNTER — Ambulatory Visit: Attending: Internal Medicine | Admitting: Internal Medicine

## 2024-01-02 VITALS — BP 142/66 | HR 64 | Ht <= 58 in | Wt 125.0 lb

## 2024-01-02 DIAGNOSIS — I272 Pulmonary hypertension, unspecified: Secondary | ICD-10-CM | POA: Insufficient documentation

## 2024-01-02 DIAGNOSIS — I493 Ventricular premature depolarization: Secondary | ICD-10-CM | POA: Insufficient documentation

## 2024-01-02 MED ORDER — FLECAINIDE ACETATE 50 MG PO TABS: 75.0000 mg | ORAL_TABLET | Freq: Two times a day (BID) | ORAL | 3 refills | Status: DC

## 2024-01-02 NOTE — Progress Notes (Signed)
 HPI Kirsten [name] returns today for followup. She is a pleasant 83 yo woman with a h/o palpitations who has been controlled on low dose flecainide . She has lost 40 lbs since her last visit as she is dieting though she has gained some back. She has been ill and had what sounds like a bowel obstruction. She recently wore a cardiac monitor showing PAC's and PVC's and bradycardia and her verapamil  was held. In the interim she notes she feels poorly. She has a lot of cardiac awareness and her most recent 2D echo showed worsening pulmonary HTN. PA estimated at 68.  No Known Allergies   Current Outpatient Medications  Medication Sig Dispense Refill   albuterol  (VENTOLIN  HFA) 108 (90 Base) MCG/ACT inhaler Inhale 2 puffs into the lungs every 6 (six) hours as needed for wheezing.     aspirin  81 MG tablet Take 81 mg by mouth daily.     Biotin 5000 MCG TABS Take 5,000 mcg by mouth daily. \     CINNAMON PO Take 1,000 mg by mouth daily.     estradiol (ESTRACE) 0.1 MG/GM vaginal cream Place vaginally.     estrogens, conjugated, (PREMARIN) 0.9 MG tablet Take 0.9 mg by mouth as directed. Take daily for 21 days then do not take for 7 days.     Glucosamine-Chondroitin (OSTEO BI-FLEX REGULAR STRENGTH PO) Take 1 tablet by mouth daily.     hydrochlorothiazide  (HYDRODIURIL ) 25 MG tablet Take 1 tablet by mouth daily.     LINZESS 72 MCG capsule 1 capsule at least 30 minutes before the first meal of the day on an empty stomach Orally Once a day for 30 days     mupirocin ointment (BACTROBAN) 2 % 1 Application.     polyethylene glycol powder (MIRALAX ) 17 GM/SCOOP powder Take 17 g by mouth 2 (two) times daily as needed for mild constipation. 255 g 1   primidone  (MYSOLINE ) 50 MG tablet Take 50 mg by mouth at bedtime.     rosuvastatin  (CRESTOR ) 10 MG tablet Take 1 tablet by mouth daily.     SYNTHROID  75 MCG tablet Take 1 tablet by mouth daily.     tiotropium (SPIRIVA  HANDIHALER) 18 MCG inhalation capsule PLACE 1 CAPSULE  INTO INHALER AND INHALE DAILY. (Patient taking differently: 18 mcg daily as needed (Shortness of breath). PLACE 1 CAPSULE INTO INHALER AND INHALE DAILY.) 90 capsule 4   triamcinolone  cream (KENALOG ) 0.1 % Apply 1 Application topically 2 (two) times daily.     Vitamin D, Ergocalciferol, (DRISDOL) 50000 UNITS CAPS Take 50,000 Units by mouth. Every other week on sunday     zolpidem (AMBIEN) 10 MG tablet Take 10 mg by mouth at bedtime as needed for sleep.      flecainide  (TAMBOCOR ) 50 MG tablet Take 1.5 tablets (75 mg total) by mouth 2 (two) times daily. TAKE 1 ADDITIONAL TABLET AS NEEDED FOR FAST HEART BEAT 180 tablet 3   pantoprazole  (PROTONIX ) 40 MG tablet 1 tablet Orally Once a day for esophagus for 30 days (Patient not taking: Reported on 01/02/2024)     No current facility-administered medications for this visit.     Past Medical History:  Diagnosis Date   Allergy    occasional seasonal   Asthma    COPD (chronic obstructive pulmonary disease) (HCC)    Emphysema of lung (HCC)    H/O: hysterectomy    Heart murmur    HTN (hypertension)    HTN (hypertension)  IGT (impaired glucose tolerance)    Irregular heart rate    take Flecinade   OSA (obstructive sleep apnea) 12/08/2017   Osteopenia    Palpitations    PONV (postoperative nausea and vomiting)    Thyroid  disease    hypothyroid   Tinnitus    Tremor, essential    Vertigo     ROS:   All systems reviewed and negative except as noted in the HPI.   Past Surgical History:  Procedure Laterality Date   CATARACT EXTRACTION     both eyes- February 2015   LAPAROSCOPY N/A 10/19/2022   Procedure: LAPAROSCOPY DIAGNOSTIC WITH LYSIS OF ADHESIONS;  Surgeon: Debby Hila, MD;  Location: [facility];  Service: General;  Laterality: N/A;   orthopedic procedure     both feet   TRIGGER FINGER RELEASE     right hand     Family History  Problem Relation Age of Onset   Kidney disease Mother    Cirrhosis Mother    Lung cancer Father     Heart disease Maternal Grandmother    Colon cancer Neg Hx    Esophageal cancer Neg Hx    Rectal cancer Neg Hx    Stomach cancer Neg Hx    Pancreatic cancer Neg Hx      Social History   Socioeconomic History   Marital status: Divorced    Spouse name: Not on file   Number of children: 0   Years of education: Not on file   Highest education level: Not on file  Occupational History   Occupation: retired armed forces logistics/support/administrative officer for [employer]  Tobacco Use   Smoking status: Never   Smokeless tobacco: Never  Substance and Sexual Activity   Alcohol  use: No   Drug use: No   Sexual activity: Not on file  Other Topics Concern   Not on file  Social History Narrative   Not on file   Social Drivers of Health   Financial Resource Strain: Not on file  Food Insecurity: No Food Insecurity (10/17/2022)   Hunger Vital Sign    Worried About Running Out of Food in the Last Year: Never true    Ran Out of Food in the Last Year: Never true  Transportation Needs: No Transportation Needs (10/17/2022)   PRAPARE - Administrator, Civil Service (Medical): No    Lack of Transportation (Non-Medical): No  Physical Activity: Not on file  Stress: Not on file  Social Connections: Not on file  Intimate Partner Violence: Not At Risk (10/17/2022)   Humiliation, Afraid, Rape, and Kick questionnaire    Fear of Current or Ex-Partner: No    Emotionally Abused: No    Physically Abused: No    Sexually Abused: No     BP (!) 142/66   Pulse 64   Ht 4' 10 (1.473 m)   Wt 125 lb (56.7 kg)   SpO2 95%   BMI 26.13 kg/m   Physical Exam:  Well appearing NAD HEENT: Unremarkable Neck:  No JVD, no thyromegally Lymphatics:  No adenopathy Back:  No CVA tenderness Lungs:  Clear with no wheezes HEART:  Regular rate rhythm, no murmurs, no rubs, no clicks Abd:  soft, positive bowel sounds, no organomegally, no rebound, no guarding Ext:  2 plus pulses, no edema, no cyanosis, no clubbing Skin:  No  rashes no nodules Neuro:  CN II through XII intact, motor grossly intact  DEVICE  Normal device function.  See PaceArt for details.  Assess/Plan:  Palpitations - these are worse on low dose flecainide  and I will increase the dose to 75 mg twice daily. She will continue. 2. PVC's - well controlled. We will follow. 3. HTN - her sbp is well controlled in the setting of weight loss and she is now a little dizzy and I have asked her to reduce her calan  to 1/2 tab daily. 4. Pulmonary HTN - I'll have her see Dr. Rolan.  Danelle Elick Aguilera,MD

## 2024-01-02 NOTE — Patient Instructions (Addendum)
 Medication Instructions:  Your physician has recommended you make the following change in your medication:  Increase flecainide  to 75 mg twice daily.  Lab Work: None ordered.  You may go to any [facility] Location for your lab work:  [facility] - [address] ([facility]) - [address] [phone] [address]  [place] - [address]   [place]  - [address]   [place] - [address] - [address] [place]  - [address] ([facility]   If you have labs (blood work) drawn today and your tests are completely normal, you will receive your results only by: [employer] (if you have MyChart)  If you have any lab test that is abnormal or we need to change your treatment, we will call you or send a MyChart message to review the results.  Testing/Procedures: None ordered.  Follow-Up: At [facility], you and your health needs are our priority.  As part of our continuing mission to provide you with exceptional heart care, we have created designated Provider Care Teams.  These Care Teams include your primary Cardiologist (physician) and Advanced Practice Providers (APPs -  Physician Assistants and Nurse Practitioners) who all work together to provide you with the care you need, when you need it.  We recommend signing up for the patient portal called MyChart.  Sign up information is provided on this After Visit Summary.  MyChart is used to connect with patients for Virtual Visits (Telemedicine).  Patients are able to view lab/test results, encounter notes, upcoming appointments, etc.  Non-urgent messages can be sent to your provider as well.   To learn more about what you can do with MyChart, go to [email or web address].    Your next appointment:   You will hear from Heart failure clinic to schedule your appointment.  EKG in 2  weeks.

## 2024-01-10 ENCOUNTER — Encounter: Payer: Self-pay | Admitting: Podiatry

## 2024-01-10 ENCOUNTER — Ambulatory Visit: Admitting: Podiatry

## 2024-01-10 DIAGNOSIS — D2372 Other benign neoplasm of skin of left lower limb, including hip: Secondary | ICD-10-CM | POA: Diagnosis not present

## 2024-01-10 DIAGNOSIS — M7752 Other enthesopathy of left foot: Secondary | ICD-10-CM | POA: Diagnosis not present

## 2024-01-10 MED ORDER — DEXAMETHASONE SODIUM PHOSPHATE 120 MG/30ML IJ SOLN
2.0000 mg | Freq: Once | INTRAMUSCULAR | Status: AC
  Administered 2024-01-10: 2 mg via INTRA_ARTICULAR

## 2024-01-10 NOTE — Progress Notes (Signed)
 She presents today states that this area appears just killing me as she refers to her callus subfifth met head left.  Objective: Vital signs are stable alert and oriented x 3.  Pulses are palpable.  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  Assessment: Bursitis fifth met left met head.  Painful benign skin lesion.  Plan: Debridement of benign skin lesion I injected the bursa today with dexamethasone  local anesthetic tolerated procedure well without complications follow-up with her in 2 months  She has a history of pulmonary hypertension and is being sent to a specialty clinic for this treatment.  Actually ask her how she is feeling next time she comes in.

## 2024-01-13 ENCOUNTER — Ambulatory Visit (HOSPITAL_COMMUNITY)
Admission: RE | Admit: 2024-01-13 | Discharge: 2024-01-13 | Disposition: A | Discharge status: Home / Self Care | Source: Ambulatory Visit | Attending: Cardiology | Admitting: Cardiology

## 2024-01-13 ENCOUNTER — Other Ambulatory Visit (HOSPITAL_COMMUNITY): Payer: Self-pay | Admitting: *Deleted

## 2024-01-13 ENCOUNTER — Encounter (HOSPITAL_COMMUNITY): Payer: Self-pay | Admitting: Cardiology

## 2024-01-13 VITALS — BP 142/72 | HR 66 | Ht <= 58 in | Wt 126.6 lb

## 2024-01-13 DIAGNOSIS — I272 Pulmonary hypertension, unspecified: Secondary | ICD-10-CM

## 2024-01-13 DIAGNOSIS — I5030 Unspecified diastolic (congestive) heart failure: Secondary | ICD-10-CM | POA: Insufficient documentation

## 2024-01-13 DIAGNOSIS — E039 Hypothyroidism, unspecified: Secondary | ICD-10-CM | POA: Diagnosis not present

## 2024-01-13 DIAGNOSIS — R002 Palpitations: Secondary | ICD-10-CM | POA: Diagnosis present

## 2024-01-13 LAB — BASIC METABOLIC PANEL WITH GFR
Anion gap: 11 (ref 5–15)
BUN: 25 mg/dL — ABNORMAL HIGH (ref 8–23)
CO2: 21 mmol/L — ABNORMAL LOW (ref 22–32)
Calcium: 9 mg/dL (ref 8.9–10.3)
Chloride: 107 mmol/L (ref 98–111)
Creatinine, Ser: 0.96 mg/dL (ref 0.44–1.00)
GFR, Estimated: 59 mL/min — ABNORMAL LOW (ref >60)
Glucose, Bld: 87 mg/dL (ref 70–99)
Potassium: 4 mmol/L (ref 3.5–5.1)
Sodium: 139 mmol/L (ref 135–145)

## 2024-01-13 LAB — CBC
HCT: 42.7 % (ref 36.0–46.0)
Hemoglobin: 13.8 g/dL (ref 12.0–15.0)
MCH: 28.2 pg (ref 26.0–34.0)
MCHC: 32.3 g/dL (ref 30.0–36.0)
MCV: 87.1 fL (ref 80.0–100.0)
Platelets: 257 K/uL (ref 150–400)
RBC: 4.9 MIL/uL (ref 3.87–5.11)
RDW: 14.3 % (ref 11.5–15.5)
WBC: 7.3 K/uL (ref 4.0–10.5)
nRBC: 0 % (ref 0.0–0.2)

## 2024-01-13 LAB — T4, FREE: Free T4: 1.52 ng/dL — ABNORMAL HIGH (ref 0.61–1.12)

## 2024-01-13 LAB — TSH: TSH: 0.118 u[IU]/mL — ABNORMAL LOW (ref 0.350–4.500)

## 2024-01-13 NOTE — H&P (View-Only) (Signed)
   ADVANCED HEART FAILURE NEW PATIENT CLINIC NOTE  Referring Physician: Loreli Elsie [name], MD  Primary Care: Loreli Elsie [name], MD Primary Cardiologist:  HPI: Kirsten [name] is a 83 y.o. female with a PMH of palptiations, hypothyroidism who presents for initial visit for further evaluation and treatment of heart failure/cardiomyopathy.      Patient with a previous cardiac history significant for palpitations.  She been followed by electrophysiology for some time with Dr. Waddell.  She had been told in the past that she had pulmonary hypertension but no significant etiology and she had not been on any medications for this.  She had been having worsening symptoms of palpitations, tremors to the point where she has had to get rid of her dog and has been walking significantly less in the past few months.  Echocardiogram showed significantly elevated PA pressures so she was referred to heart failure for further evaluation.     SUBJECTIVE:  Patient reports being significantly limited by her symptoms, she reports that she feels ongoing palpitations as well as an ongoing tremor that significantly affects her ability to conduct her activities of daily living.  She has had to reduce her physical activity significantly due to shortness of breath with exertion.  We reviewed her Zio patch which showed very low PVC burden, but her symptoms seem to correspond with episodes of junctional bradycardia.  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  PMH, current medications, allergies, social history, and family history reviewed in epic.  PHYSICAL EXAM: Vitals:   01/13/24 1121  BP: (!) 142/72  Pulse: 66  SpO2: 94%   GENERAL: Well nourished and in no apparent distress at rest.  PULM:  Normal work of breathing, clear to auscultation bilaterally. Respirations are unlabored.  CARDIAC:  JVP: Flat         Normal rate with regular  rhythm. No murmurs, rubs or gallops.  Trace edema. Warm and well perfused extremities. ABDOMEN: Soft, non-tender, non-distended. NEUROLOGIC: Patient is oriented x3 with no focal or lateralizing neurologic deficits.    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  Discussed need for right heart catheterization for optimal treatment. - RHC ordered - Suspect primarily group II, though may have a component of Group III - Further therapy pending pressures, but anticipate that may need diuretics/spironolactone  Tremors:  - While having ongoing palptiations, zio patch with symptoms more related to junctional rhythm  -Suspect will need PPM at some point - TSH/free T4 ordered today to rule out treatment induced hyperthyrodism  Follow up in 3 months  I have reviewed the risks, indications, and alternatives to cardiac catheterization +/- angioplasty or stenting with the patient. Risks include but are not limited to bleeding, infection, vascular injury, stroke, myocardial infection, arrhythmia, kidney injury, radiation-related injury in the case of prolonged fluoroscopy use, emergency cardiac surgery, and death. The patient understands the risks of serious complication is low (<1%) and he agrees to proceed.    Morene Brownie, MD Advanced Heart Failure Mechanical Circulatory Support 01/17/24

## 2024-01-13 NOTE — Patient Instructions (Signed)
 Medication Changes:  None, continue current medications  Lab Work:  Labs done today, your results will be available in MyChart, we will contact you for abnormal readings.  Testing/Procedures:  Heart Catheterization, see instructions below  Special Instructions // Education:   HEART CATHETERIZATION INSTRUCTIONS:  You are scheduled for a Cardiac Catheterization on Tuesday, December 2 with Dr. Odis Brownie.  1. Please arrive at the [facility] (Main Entrance A) at [facility]: [address] [place], [place] [address] at 7:00 AM (This time is 2 hour(s) before your procedure to ensure your preparation).   Free valet parking service is available. You will check in at ADMITTING. The support person will be asked to wait in the waiting room.  It is OK to have someone drop you off and come back when you are ready to be discharged.    Special note: Every effort is made to have your procedure done on time. Please understand that emergencies sometimes delay scheduled procedures.  2. Diet: Nothing to eat after midnight.   3. Hydration: On December 2, you may drink approved liquids (see below) until 2 hours before the procedure with 8 oz of water as your last intake.   List of approved liquids water, clear juice, clear tea, black coffee, fruit juices, non-citric and without pulp, carbonated beverages, Gatorade, Kool -Aid, plain Jello-O and plain ice popsicles.  4. Labs: DONE TODAY  5. Medication instructions in preparation for your procedure:   Tuesday 01/24/24 DO NOT TAKE Hydrocholothiazide  On the morning of your procedure, take your Aspirin  81 mg and any morning medicines NOT listed above.  You may use sips of water.  6. Plan to go home the same day, you will only stay overnight if medically necessary. 7. Bring a current list of your medications and current insurance cards. 8. You MUST have a responsible person to drive you home. 9. Someone MUST be with you the first 24 hours  after you arrive home or your discharge will be delayed. 10. Please wear clothes that are easy to get on and off and wear slip-on shoes.  Thank you for allowing us  to care for you!   -- [facility]   Follow-Up in: 2 months with Dr Brownie (Jan/Feb 2026), **PLEASE CALL OUR OFFICE IN DECEMBER TO SCHEDULE THIS APPOINTMENT   At the [facility], you and your health needs are our priority. We have a designated team specialized in the treatment of Heart Failure. This Care Team includes your primary Heart Failure Specialized Cardiologist (physician), Advanced Practice Providers (APPs- Physician Assistants and Nurse Practitioners), and Pharmacist who all work together to provide you with the care you need, when you need it.   You may see any of the following providers on your designated Care Team at your next follow up:  Dr. Toribio Fuel Dr. Ezra Shuck Dr. Odis Brownie Greig Mosses, NP Caffie Shed, [place] [facility] [place], [place] Beckey Coe, NP Jordan [name], NP Tinnie Redman, PharmD   Please be sure to bring in all your medications bottles to every appointment.   Need to Contact Us :  If you have any questions or concerns before your next appointment please send us  a message through [place] or call our office at [phone].    TO LEAVE A MESSAGE FOR THE NURSE SELECT OPTION 2, PLEASE LEAVE A MESSAGE INCLUDING: YOUR NAME DATE OF BIRTH CALL BACK NUMBER REASON FOR CALL**this is important as we prioritize the call backs  YOU WILL RECEIVE A CALL BACK THE SAME DAY AS LONG AS YOU CALL BEFORE 4:00 PM

## 2024-01-13 NOTE — Progress Notes (Signed)
   ADVANCED HEART FAILURE NEW PATIENT CLINIC NOTE  Referring Physician: Loreli Elsie [name], MD  Primary Care: Loreli Elsie [name], MD Primary Cardiologist:  HPI: Kirsten [name] is a 83 y.o. female with a PMH of palptiations, hypothyroidism who presents for initial visit for further evaluation and treatment of heart failure/cardiomyopathy.      Patient with a previous cardiac history significant for palpitations.  She been followed by electrophysiology for some time with Dr. Waddell.  She had been told in the past that she had pulmonary hypertension but no significant etiology and she had not been on any medications for this.  She had been having worsening symptoms of palpitations, tremors to the point where she has had to get rid of her dog and has been walking significantly less in the past few months.  Echocardiogram showed significantly elevated PA pressures so she was referred to heart failure for further evaluation.     SUBJECTIVE:  Patient reports being significantly limited by her symptoms, she reports that she feels ongoing palpitations as well as an ongoing tremor that significantly affects her ability to conduct her activities of daily living.  She has had to reduce her physical activity significantly due to shortness of breath with exertion.  We reviewed her Zio patch which showed very low PVC burden, but her symptoms seem to correspond with episodes of junctional bradycardia.  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  PMH, current medications, allergies, social history, and family history reviewed in epic.  PHYSICAL EXAM: Vitals:   01/13/24 1121  BP: (!) 142/72  Pulse: 66  SpO2: 94%   GENERAL: Well nourished and in no apparent distress at rest.  PULM:  Normal work of breathing, clear to auscultation bilaterally. Respirations are unlabored.  CARDIAC:  JVP: Flat         Normal rate with regular  rhythm. No murmurs, rubs or gallops.  Trace edema. Warm and well perfused extremities. ABDOMEN: Soft, non-tender, non-distended. NEUROLOGIC: Patient is oriented x3 with no focal or lateralizing neurologic deficits.    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  Discussed need for right heart catheterization for optimal treatment. - RHC ordered - Suspect primarily group II, though may have a component of Group III - Further therapy pending pressures, but anticipate that may need diuretics/spironolactone  Tremors:  - While having ongoing palptiations, zio patch with symptoms more related to junctional rhythm  -Suspect will need PPM at some point - TSH/free T4 ordered today to rule out treatment induced hyperthyrodism  Follow up in 3 months  I have reviewed the risks, indications, and alternatives to cardiac catheterization +/- angioplasty or stenting with the patient. Risks include but are not limited to bleeding, infection, vascular injury, stroke, myocardial infection, arrhythmia, kidney injury, radiation-related injury in the case of prolonged fluoroscopy use, emergency cardiac surgery, and death. The patient understands the risks of serious complication is low (<1%) and he agrees to proceed.    Morene Brownie, MD Advanced Heart Failure Mechanical Circulatory Support 01/17/24

## 2024-01-16 ENCOUNTER — Ambulatory Visit (HOSPITAL_COMMUNITY): Payer: Self-pay | Admitting: Cardiology

## 2024-01-16 DIAGNOSIS — E039 Hypothyroidism, unspecified: Secondary | ICD-10-CM

## 2024-01-17 ENCOUNTER — Ambulatory Visit

## 2024-01-17 VITALS — HR 55 | Ht <= 58 in | Wt 129.0 lb

## 2024-01-17 DIAGNOSIS — Z5181 Encounter for therapeutic drug level monitoring: Secondary | ICD-10-CM | POA: Diagnosis present

## 2024-01-17 NOTE — Progress Notes (Signed)
   Nurse Visit   Date of Encounter: 01/17/2024 ID: Kirsten [name], DOB 22-Jul-1940, MRN [id number]  PCP:  Loreli Elsie [name], MD   [facility] Providers Cardiologist:  Danelle Birmingham, MD Electrophysiologist:  Danelle Birmingham, MD      Visit Details   VS:  Pulse (!) 55   Ht 4' 10 (1.473 m)   Wt 129 lb (58.5 kg)   BMI 26.96 kg/m  , BMI Body mass index is 26.96 kg/m.  Wt Readings from Last 3 Encounters:  01/17/24 129 lb (58.5 kg)  01/13/24 126 lb 9.6 oz (57.4 kg)  01/02/24 125 lb (56.7 kg)     Reason for visit: EKG for increase flecainide  dosage Performed today: Vitals, EKG, and Provider consulted: Dr. Azobou (DOD) Changes (medications, testing, etc.) : no changes Length of Visit: 8 minutes    Medications Adjustments/Labs and Tests Ordered: Orders Placed This Encounter  Procedures   EKG 12-Lead   No orders of the defined types were placed in this encounter.    Signed, Channing Savich L, RN  01/17/2024 11:06 AM

## 2024-01-17 NOTE — Telephone Encounter (Signed)
 Patient aware to HOLD synthroid   Will call back to schedule repeat labs x 4 weeks  Message regarding thyroid  and biotin forwarded to provider

## 2024-01-17 NOTE — Patient Instructions (Signed)
 Medication Instructions:  Your physician recommends that you continue on your current medications as directed. Please refer to the Current Medication list given to you today.  *If you need a refill on your cardiac medications before your next appointment, please call your pharmacy*   Follow-Up: At [facility], you and your health needs are our priority.  As part of our continuing mission to provide you with exceptional heart care, our providers are all part of one team.  This team includes your primary Cardiologist (physician) and Advanced Practice Providers or APPs (Physician Assistants and Nurse Practitioners) who all work together to provide you with the care you need, when you need it.  Your next appointment:   Post Heart Cath in January   Provider:   Dr. Zenaida

## 2024-01-18 NOTE — Addendum Note (Signed)
 Addended by: Aideliz Garmany, DALTON HERO on: 01/18/2024 12:27 PM   Modules accepted: Orders

## 2024-01-23 ENCOUNTER — Telehealth (HOSPITAL_COMMUNITY): Payer: Self-pay

## 2024-01-23 NOTE — Telephone Encounter (Signed)
 Spoke to patient regarding procedure scheduled for tomorrow. Has transportation to and from. Aware of nothing to eat or drink after midnight.Holding hydrochlorothiazide  in the am .

## 2024-01-24 ENCOUNTER — Encounter (HOSPITAL_COMMUNITY)
Admission: RE | Disposition: A | Discharge status: Home / Self Care | Payer: Self-pay | Source: Home / Self Care | Attending: Cardiology

## 2024-01-24 ENCOUNTER — Ambulatory Visit (HOSPITAL_COMMUNITY)
Admission: RE | Admit: 2024-01-24 | Discharge: 2024-01-24 | Disposition: A | Discharge status: Home / Self Care | Attending: Cardiology | Admitting: Cardiology

## 2024-01-24 ENCOUNTER — Other Ambulatory Visit: Payer: Self-pay

## 2024-01-24 DIAGNOSIS — I272 Pulmonary hypertension, unspecified: Secondary | ICD-10-CM | POA: Diagnosis not present

## 2024-01-24 DIAGNOSIS — I509 Heart failure, unspecified: Secondary | ICD-10-CM | POA: Insufficient documentation

## 2024-01-24 DIAGNOSIS — I429 Cardiomyopathy, unspecified: Secondary | ICD-10-CM | POA: Insufficient documentation

## 2024-01-24 DIAGNOSIS — E039 Hypothyroidism, unspecified: Secondary | ICD-10-CM | POA: Diagnosis not present

## 2024-01-24 DIAGNOSIS — R251 Tremor, unspecified: Secondary | ICD-10-CM | POA: Insufficient documentation

## 2024-01-24 LAB — POCT I-STAT EG7
Acid-Base Excess: 0 mmol/L (ref 0.0–2.0)
Acid-base deficit: 1 mmol/L (ref 0.0–2.0)
Bicarbonate: 25.2 mmol/L (ref 20.0–28.0)
Bicarbonate: 26.2 mmol/L (ref 20.0–28.0)
Calcium, Ion: 1.22 mmol/L (ref 1.15–1.40)
Calcium, Ion: 1.25 mmol/L (ref 1.15–1.40)
HCT: 39 % (ref 36.0–46.0)
HCT: 40 % (ref 36.0–46.0)
Hemoglobin: 13.3 g/dL (ref 12.0–15.0)
Hemoglobin: 13.6 g/dL (ref 12.0–15.0)
O2 Saturation: 64 %
O2 Saturation: 66 %
Potassium: 3.6 mmol/L (ref 3.5–5.1)
Potassium: 3.6 mmol/L (ref 3.5–5.1)
Sodium: 138 mmol/L (ref 135–145)
Sodium: 139 mmol/L (ref 135–145)
TCO2: 27 mmol/L (ref 22–32)
TCO2: 28 mmol/L (ref 22–32)
pCO2, Ven: 44.8 mmHg (ref 44–60)
pCO2, Ven: 45.5 mmHg (ref 44–60)
pH, Ven: 7.358 (ref 7.25–7.43)
pH, Ven: 7.369 (ref 7.25–7.43)
pO2, Ven: 35 mmHg (ref 32–45)
pO2, Ven: 36 mmHg (ref 32–45)

## 2024-01-24 SURGERY — RIGHT HEART CATH
Anesthesia: LOCAL

## 2024-01-24 MED ORDER — SODIUM CHLORIDE 0.9% FLUSH: 3.0000 mL | INTRAVENOUS | Status: DC | PRN

## 2024-01-24 MED ORDER — HEPARIN (PORCINE) IN NACL 1000-0.9 UT/500ML-% IV SOLN
INTRAVENOUS | Status: DC | PRN
  Administered 2024-01-24: 500 mL

## 2024-01-24 MED ORDER — FREE WATER: 250.0000 mL | Freq: Once | Status: DC

## 2024-01-24 MED ORDER — SODIUM CHLORIDE 0.9 % IV SOLN: 250.0000 mL | INTRAVENOUS | Status: DC | PRN

## 2024-01-24 MED ORDER — MIDAZOLAM HCL (PF) 2 MG/2ML IJ SOLN
INTRAMUSCULAR | Status: DC | PRN
  Administered 2024-01-24: 1 mg via INTRAVENOUS

## 2024-01-24 MED ORDER — SODIUM CHLORIDE 0.9% FLUSH: 3.0000 mL | Freq: Two times a day (BID) | INTRAVENOUS | Status: DC

## 2024-01-24 MED ORDER — MIDAZOLAM HCL 2 MG/2ML IJ SOLN
INTRAMUSCULAR | Status: AC
  Filled 2024-01-24: qty 2

## 2024-01-24 MED ORDER — FENTANYL CITRATE (PF) 100 MCG/2ML IJ SOLN
INTRAMUSCULAR | Status: AC
  Filled 2024-01-24: qty 2

## 2024-01-24 MED ORDER — FENTANYL CITRATE (PF) 100 MCG/2ML IJ SOLN
INTRAMUSCULAR | Status: DC | PRN
  Administered 2024-01-24: 25 ug via INTRAVENOUS

## 2024-01-24 MED ORDER — LIDOCAINE HCL (PF) 1 % IJ SOLN
INTRAMUSCULAR | Status: AC
  Filled 2024-01-24: qty 30

## 2024-01-24 SURGICAL SUPPLY — 5 items
CATH BALLN WEDGE 5F 110CM (CATHETERS) IMPLANT
PACK CARDIAC CATHETERIZATION (CUSTOM PROCEDURE TRAY) ×1 IMPLANT
SHEATH GLIDE SLENDER 4/5FR (SHEATH) IMPLANT
TRANSDUCER W/STOPCOCK (MISCELLANEOUS) IMPLANT
TUBING ART PRESS 72 MALE/FEM (TUBING) IMPLANT

## 2024-01-24 NOTE — Telephone Encounter (Signed)
 Discussed with patient during RHC today

## 2024-01-24 NOTE — Discharge Instructions (Signed)

## 2024-01-24 NOTE — Progress Notes (Signed)
 Discharge instructions reviewed with patient and neighbor at bedside. Denies questions concerns.  Incision site remains clean dry and intact. No s/s of complications. PT escorted from the unit via wheel chair to personal vehicle.

## 2024-01-24 NOTE — Interval H&P Note (Signed)
 History and Physical Interval Note:  01/24/2024 9:09 AM  Kirsten [name]  has presented today for surgery, with the diagnosis of hp.  The various methods of treatment have been discussed with the patient and family. After consideration of risks, benefits and other options for treatment, the patient has consented to  Procedure(s): RIGHT HEART CATH (N/A) as a surgical intervention.  The patient's history has been reviewed, patient examined, no change in status, stable for surgery.  I have reviewed the patient's chart and labs.  Questions were answered to the patient's satisfaction.     [name]

## 2024-01-25 ENCOUNTER — Encounter (HOSPITAL_COMMUNITY): Payer: Self-pay | Admitting: Cardiology

## 2024-01-27 ENCOUNTER — Telehealth (HOSPITAL_COMMUNITY): Payer: Self-pay | Admitting: Cardiology

## 2024-01-27 DIAGNOSIS — I272 Pulmonary hypertension, unspecified: Secondary | ICD-10-CM

## 2024-01-27 MED ORDER — LOSARTAN POTASSIUM 25 MG PO TABS: 25.0000 mg | ORAL_TABLET | Freq: Every day | ORAL | 3 refills | Status: DC

## 2024-01-27 NOTE — Telephone Encounter (Signed)
 Pt aware.

## 2024-01-27 NOTE — Telephone Encounter (Signed)
 Patient called to report elevation in b/p since stopping medication recently- Verapamil  d/c'd mid September  B/P readings today  154/81 144/71 156/75  Reports b/p was elevated during cath and was given something to help bring it down. Pt wondered if she needs to be on something for b/p    Denies CP Reports flutters and palps

## 2024-01-27 NOTE — Telephone Encounter (Signed)
 Follow up with EP regarding palpitations. Start losartan  losartan  25 mg daily. Will need BMET at f/u

## 2024-01-30 NOTE — Telephone Encounter (Signed)
 Patient called to report since starting new b/p medications she has experienced increase in fatigue   Losartan  25 mg x 3 doses No vitals available however she is aware her HR is low at 52  Reports she takes medication the AM  Would like to know if she should stop medication, decrease or continue with feeling so tired   Please advise

## 2024-01-31 NOTE — Telephone Encounter (Signed)
 Pt aware and voiced understanding

## 2024-02-20 ENCOUNTER — Ambulatory Visit: Admitting: Internal Medicine

## 2024-02-20 ENCOUNTER — Ambulatory Visit (HOSPITAL_COMMUNITY)
Admission: RE | Admit: 2024-02-20 | Discharge: 2024-02-20 | Disposition: A | Discharge status: Home / Self Care | Source: Ambulatory Visit | Attending: Cardiology | Admitting: Cardiology

## 2024-02-20 ENCOUNTER — Ambulatory Visit (HOSPITAL_COMMUNITY): Payer: Self-pay | Admitting: Physician Assistant

## 2024-02-20 DIAGNOSIS — E039 Hypothyroidism, unspecified: Secondary | ICD-10-CM | POA: Insufficient documentation

## 2024-02-20 DIAGNOSIS — I272 Pulmonary hypertension, unspecified: Secondary | ICD-10-CM | POA: Diagnosis present

## 2024-02-20 LAB — BASIC METABOLIC PANEL WITH GFR
Anion gap: 13 (ref 5–15)
BUN: 30 mg/dL — ABNORMAL HIGH (ref 8–23)
CO2: 22 mmol/L (ref 22–32)
Calcium: 9.4 mg/dL (ref 8.9–10.3)
Chloride: 104 mmol/L (ref 98–111)
Creatinine, Ser: 1.21 mg/dL — ABNORMAL HIGH (ref 0.44–1.00)
GFR, Estimated: 45 mL/min — ABNORMAL LOW
Glucose, Bld: 107 mg/dL — ABNORMAL HIGH (ref 70–99)
Potassium: 4.4 mmol/L (ref 3.5–5.1)
Sodium: 138 mmol/L (ref 135–145)

## 2024-02-20 LAB — TSH: TSH: 73.6 u[IU]/mL — ABNORMAL HIGH (ref 0.350–4.500)

## 2024-02-21 ENCOUNTER — Telehealth (HOSPITAL_COMMUNITY): Payer: Self-pay

## 2024-02-21 MED ORDER — LEVOTHYROXINE SODIUM 50 MCG PO TABS: 50.0000 ug | ORAL_TABLET | Freq: Every day | ORAL | 3 refills | Status: DC

## 2024-02-21 MED ORDER — SYNTHROID 50 MCG PO TABS: 50.0000 ug | ORAL_TABLET | Freq: Every day | ORAL | 3 refills | Status: DC

## 2024-02-21 NOTE — Telephone Encounter (Signed)
 Patient returned call to report insurance will only pay for brand name medication Rx updated

## 2024-02-21 NOTE — Addendum Note (Signed)
 Addended by: Yonis Carreon, DALTON HERO on: 02/21/2024 03:59 PM   Modules accepted: Orders

## 2024-02-21 NOTE — Telephone Encounter (Signed)
 Called patient. She is agreeable and new Rx sent to her pharmacy

## 2024-02-21 NOTE — Telephone Encounter (Signed)
 Start synthroid  50 mg daily, previously on 75 mg daily (that dose was probably too much for her).  TSH and free T4 in 4 weeks.

## 2024-02-21 NOTE — Telephone Encounter (Signed)
 Patient called in stating she is concerned about her Thyroid  levels being elevated so quickly. And she stated she is having powerful palpitations and they are keeping her up at night to the point she can't sleep, they do not hurt she said but she can feel them. Patient would like to know if she should go back on her Synthroid  she has plenty and how she should start taking it and what she can do about the palpitations? Please advise

## 2024-02-27 ENCOUNTER — Ambulatory Visit (HOSPITAL_COMMUNITY)
Admission: RE | Admit: 2024-02-27 | Discharge: 2024-02-27 | Disposition: A | Discharge status: Home / Self Care | Source: Ambulatory Visit | Attending: Cardiology | Admitting: Cardiology

## 2024-02-27 VITALS — BP 138/72 | HR 55 | Wt 132.0 lb

## 2024-02-27 DIAGNOSIS — R0602 Shortness of breath: Secondary | ICD-10-CM | POA: Insufficient documentation

## 2024-02-27 DIAGNOSIS — R001 Bradycardia, unspecified: Secondary | ICD-10-CM | POA: Diagnosis not present

## 2024-02-27 DIAGNOSIS — Z79890 Hormone replacement therapy (postmenopausal): Secondary | ICD-10-CM | POA: Insufficient documentation

## 2024-02-27 DIAGNOSIS — I110 Hypertensive heart disease with heart failure: Secondary | ICD-10-CM | POA: Diagnosis not present

## 2024-02-27 DIAGNOSIS — R251 Tremor, unspecified: Secondary | ICD-10-CM | POA: Diagnosis not present

## 2024-02-27 DIAGNOSIS — I272 Pulmonary hypertension, unspecified: Secondary | ICD-10-CM | POA: Diagnosis not present

## 2024-02-27 DIAGNOSIS — I509 Heart failure, unspecified: Secondary | ICD-10-CM | POA: Diagnosis not present

## 2024-02-27 DIAGNOSIS — I429 Cardiomyopathy, unspecified: Secondary | ICD-10-CM | POA: Insufficient documentation

## 2024-02-27 DIAGNOSIS — R002 Palpitations: Secondary | ICD-10-CM | POA: Diagnosis not present

## 2024-02-27 DIAGNOSIS — E039 Hypothyroidism, unspecified: Secondary | ICD-10-CM | POA: Insufficient documentation

## 2024-02-27 DIAGNOSIS — I440 Atrioventricular block, first degree: Secondary | ICD-10-CM | POA: Diagnosis not present

## 2024-02-27 DIAGNOSIS — I493 Ventricular premature depolarization: Secondary | ICD-10-CM | POA: Insufficient documentation

## 2024-02-27 MED ORDER — AMLODIPINE BESYLATE 5 MG PO TABS: 5.0000 mg | ORAL_TABLET | Freq: Every day | ORAL | 3 refills | Status: AC

## 2024-02-27 MED ORDER — SYNTHROID 50 MCG PO TABS: 50.0000 ug | ORAL_TABLET | Freq: Every day | ORAL | 3 refills | Status: DC

## 2024-02-27 MED ORDER — SILDENAFIL CITRATE 20 MG PO TABS: 20.0000 mg | ORAL_TABLET | Freq: Three times a day (TID) | ORAL | 3 refills | Status: DC

## 2024-02-27 NOTE — Patient Instructions (Addendum)
 Medication Changes:  START SILDENAFIL  20MG  THREE TIMES DAILY   START AMLODIPINE  5MG  ONCE DAILY   STOP LOSARTAN   Testing/Procedures:  PULMONARY FUNCTION TESTS AS SCHEDULED  No smoking, no breathing treatments, and no caffeine 4 hours prior to testing.   Follow-Up in: 3 months with Dr. Zenaida PLEASE CALL OUR OFFICE AROUND FEBRUARY  TO GET SCHEDULED FOR YOUR APPOINTMENT. PHONE NUMBER IS [phone] OPTION 2   At the [facility], you and your health needs are our priority. We have a designated team specialized in the treatment of Heart Failure. This Care Team includes your primary Heart Failure Specialized Cardiologist (physician), Advanced Practice Providers (APPs- Physician Assistants and Nurse Practitioners), and Pharmacist who all work together to provide you with the care you need, when you need it.   You may see any of the following providers on your designated Care Team at your next follow up:  Dr. Toribio Fuel Dr. Ezra Shuck Dr. Odis Zenaida Greig Mosses, NP Caffie Shed, [place] [facility] [place], [place] Beckey Coe, NP Jordan [name], NP Tinnie Redman, PharmD   Please be sure to bring in all your medications bottles to every appointment.   Need to Contact Us :  If you have any questions or concerns before your next appointment please send us  a message through [place] or call our office at [phone].    TO LEAVE A MESSAGE FOR THE NURSE SELECT OPTION 2, PLEASE LEAVE A MESSAGE INCLUDING: YOUR NAME DATE OF BIRTH CALL BACK NUMBER REASON FOR CALL**this is important as we prioritize the call backs  YOU WILL RECEIVE A CALL BACK THE SAME DAY AS LONG AS YOU CALL BEFORE 4:00 PM

## 2024-02-27 NOTE — Progress Notes (Signed)
" ° °  ADVANCED HEART FAILURE FOLLOW UP CLINIC NOTE  Referring Physician: Loreli Elsie [name], MD  Primary Care: Loreli Elsie [name], MD Primary Cardiologist:  HPI: Kirsten [name] is a 84 y.o. female with a PMH of palptiations, hypothyroidism who presents for initial visit for further evaluation and treatment of heart failure/cardiomyopathy.      Patient with a previous cardiac history significant for palpitations.  She been followed by electrophysiology for some time with Dr. Waddell.  She had been told in the past that she had pulmonary hypertension but no significant etiology and she had not been on any medications for this.  She had been having worsening symptoms of palpitations, tremors to the point where she has had to get rid of her dog and has been walking significantly less in the past few months.  Echocardiogram showed significantly elevated PA pressures so she was referred to heart failure for further evaluation.     SUBJECTIVE:  Patient restarted recently on levothyroxine , doing better. She reports ongoing issues with shortness of breath with exertion and palpitations. These symptoms are extremely limiting and have been going for the past few months. She reports some worsening facial swelling on losartan .   PMH, current medications, allergies, social history, and family history reviewed in epic.  PHYSICAL EXAM: Vitals:   02/27/24 1349  BP: 138/72  Pulse: (!) 55  SpO2: 96%    GENERAL: NAD, fair appearing PULM:  Normal work of breathing, CTAB CARDIAC:  JVP: flat         Normal rate with regular rhythm. No murmurs, rubs or gallops.  Trace edema. Warm and well perfused extremities. ABDOMEN: Soft, non-tender, non-distended. NEUROLOGIC: Patient is oriented x3 with no focal or lateralizing neurologic deficits.     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 Will start low dose sildenafil  and assess response. - PFTs ordered - Pending PFTs will need repeat HRCT - V/Q scan at next visit - RHC reviewed - Start sildenafil  20mg  TID  Tremors:  - While having ongoing palptiations, zio patch with symptoms more related to junctional rhythm  -Suspect will need PPM at some point - Patient felt worse on high dose of flecanide, can reduce dose back to 2 pills daily from 3, espeically with low PVC burden. Suspect flecanide may be contributing to symptoms but will defer to upcoming EP visit - Restarting levothyroxine , cautioned to stop biotin  HTN:  - Start amlodipine  - Stop losartan   Follow up in 3 months   Morene Brownie, MD Advanced Heart Failure Mechanical Circulatory Support 02/28/2024 "

## 2024-03-05 ENCOUNTER — Ambulatory Visit (HOSPITAL_COMMUNITY)
Admission: RE | Admit: 2024-03-05 | Discharge: 2024-03-05 | Disposition: A | Discharge status: Home / Self Care | Source: Ambulatory Visit | Attending: Cardiology | Admitting: Cardiology

## 2024-03-05 ENCOUNTER — Ambulatory Visit (HOSPITAL_COMMUNITY): Payer: Self-pay | Admitting: Cardiology

## 2024-03-05 DIAGNOSIS — I272 Pulmonary hypertension, unspecified: Secondary | ICD-10-CM | POA: Diagnosis present

## 2024-03-05 LAB — PULMONARY FUNCTION TEST
DL/VA % pred: 79 %
DL/VA: 3.39 ml/min/mmHg/L
DLCO unc % pred: 56 %
DLCO unc: 8.5 ml/min/mmHg
FEF 25-75 Pre: 0.87 L/s
FEF2575-%Pred-Pre: 88 %
FEV1-%Pred-Pre: 81 %
FEV1-Pre: 1.08 L
FEV1FVC-%Pred-Pre: 105 %
FEV6-%Pred-Pre: 81 %
FEV6-Pre: 1.39 L
FEV6FVC-%Pred-Pre: 107 %
FVC-%Pred-Pre: 76 %
FVC-Pre: 1.39 L
Pre FEV1/FVC ratio: 78 %
Pre FEV6/FVC Ratio: 100 %
RV % pred: 135 %
RV: 2.88 L
TLC % pred: 104 %
TLC: 4.35 L

## 2024-03-08 ENCOUNTER — Telehealth (HOSPITAL_COMMUNITY): Payer: Self-pay | Admitting: Cardiology

## 2024-03-08 NOTE — Telephone Encounter (Addendum)
 Patient called to report decrease in UOP and facial swelling  Reports she can tell her weight is up however no weights are available Reports mild SOB  Reports she also contact PCP for same problem and was able to get add on appt 1/16  Will call back if changes are needed

## 2024-03-09 LAB — LAB REPORT - SCANNED: EGFR: 59.9

## 2024-03-15 ENCOUNTER — Other Ambulatory Visit (HOSPITAL_COMMUNITY): Payer: Self-pay | Admitting: Cardiology

## 2024-03-15 MED ORDER — SYNTHROID 50 MCG PO TABS: 50.0000 ug | ORAL_TABLET | Freq: Every day | ORAL | 3 refills | Status: AC

## 2024-03-20 ENCOUNTER — Ambulatory Visit: Attending: Cardiology | Admitting: Cardiology

## 2024-03-20 ENCOUNTER — Encounter: Payer: Self-pay | Admitting: Cardiology

## 2024-03-20 VITALS — BP 124/76 | HR 70 | Ht <= 58 in | Wt 130.5 lb

## 2024-03-20 DIAGNOSIS — I493 Ventricular premature depolarization: Secondary | ICD-10-CM | POA: Diagnosis not present

## 2024-03-20 DIAGNOSIS — R002 Palpitations: Secondary | ICD-10-CM | POA: Diagnosis not present

## 2024-03-20 DIAGNOSIS — I272 Pulmonary hypertension, unspecified: Secondary | ICD-10-CM | POA: Diagnosis not present

## 2024-03-20 DIAGNOSIS — I10 Essential (primary) hypertension: Secondary | ICD-10-CM | POA: Diagnosis not present

## 2024-03-20 NOTE — Progress Notes (Signed)
 " Electrophysiology Office Note:   Date:  03/20/2024  ID:  Kirsten [name], DOB 02-07-1941, MRN [id number]  Primary Cardiologist: Danelle Birmingham, MD Primary Heart Failure: None Electrophysiologist: Danelle Birmingham, MD      History of Present Illness:   Kirsten [name] is a 84 y.o. female with h/o pulmonary hypertension, hypertension, palpitations seen today for routine electrophysiology followup.   Discussed the use of AI scribe software for clinical note transcription with the patient, who gave verbal consent to proceed.  History of Present Illness Kirsten [name] is an 84 year old female with pulmonary hypertension who presents with heart palpitations and shortness of breath.  She experiences heart fluttering and flipping sensations, accompanied by shortness of breath and episodes where 'things go dark.' These symptoms occur several times a day, necessitating her to lay down on the floor to prevent falls. Her Apple Watch often sends alerts thinking she has fallen due to these frequent episodes.  She has been on sildenafil  for pulmonary hypertension but continues to experience significant shortness of breath. Her heart rate has been recorded as low as 35 beats per minute, particularly during early morning hours, although she is usually awake at that time.  She has been on flecainide  for approximately eight years. The medication initially resolved her symptoms, but they returned in August. Her current regimen is two pills a day, though she is unsure of the exact dosage. She has not noticed any improvement with adjustments to the flecainide  dosage and reports feeling worse with recent changes.  She has a history of pulmonary hypertension, which was initially described as mild but was later reported to her as severe. She has also experienced junctional rhythm, which she describes as her heart 'having a beat and then catching up.'  She lives alone and has had to give up her dog due to her inability to walk  it safely, which she describes as a significant loss. She feels frustrated and exhausted from the ongoing symptoms and the impact on her daily life.   she denies chest pain, palpitations, dyspnea, PND, orthopnea, nausea, vomiting, dizziness, syncope, edema, weight gain, or early satiety.   Review of systems complete and found to be negative unless listed in HPI.   EP Information / Studies Reviewed:    EKG is ordered today. Personal review as below.  EKG Interpretation Date/Time:  Tuesday March 20 2024 14:43:01 EST Ventricular Rate:  70 PR Interval:    QRS Duration:  80 QT Interval:  414 QTC Calculation: 447 R Axis:   81  Text Interpretation: Accelerated Junctional rhythm When compared with ECG of 17-Jan-2024 10:44, Junctional rhythm has replaced Sinus rhythm Confirmed by Arda Daggs ([id number]) on 03/20/2024 3:00:25 PM     Physical Exam:   VS:  BP 124/76 (BP Location: Left Arm, Patient Position: Sitting, Cuff Size: Normal)   Pulse 70   Ht 4' 10 (1.473 m)   Wt 130 lb 8 oz (59.2 kg)   SpO2 95%   BMI 27.27 kg/m    Wt Readings from Last 3 Encounters:  03/20/24 130 lb 8 oz (59.2 kg)  02/27/24 132 lb (59.9 kg)  01/24/24 128 lb (58.1 kg)     GEN: Well nourished, well developed in no acute distress NECK: No JVD; No carotid bruits CARDIAC: Regular rate and rhythm, no murmurs, rubs, gallops RESPIRATORY:  Clear to auscultation without rales, wheezing or rhonchi  ABDOMEN: Soft, non-tender, non-distended EXTREMITIES:  No edema; No deformity   Risk Assessment/Calculations:  ASSESSMENT AND PLAN:    1.  Palpitations: Has PACs and PVCs.  On flecainide .  She feels quite poorly.  She is in junctional rhythm today.  She wore a cardiac monitor, personally reviewed that showed no PACs or PVCs.  I am concerned that her junctional rhythm may be giving her symptoms.  Kirsten [name] stop her flecainide  today.  I Kirsten [name] also discuss this further with her heart failure cardiologist as there may be a  pulmonary hypertension component.  2.  Hypertension: Well-controlled  3.  Pulmonary hypertension: Per heart failure cardiology  Follow up with EP Team in 4 weeks  Signed, Kirsten Ortner Gladis Norton, MD  "

## 2024-03-20 NOTE — Patient Instructions (Signed)
 Medication Instructions:  Your physician has recommended you make the following change in your medication:  1) STOP taking flecainide  *If you need a refill on your cardiac medications before your next appointment, please call your pharmacy*  Follow-Up: At [facility], you and your health needs are our priority.  As part of our continuing mission to provide you with exceptional heart care, our providers are all part of one team.  This team includes your primary Cardiologist (physician) and Advanced Practice Providers or APPs (Physician Assistants and Nurse Practitioners) who all work together to provide you with the care you need, when you need it.  Your next appointment:   4-6 weeks  Provider:   Daphne Barrack, NP

## 2024-03-30 ENCOUNTER — Telehealth (HOSPITAL_COMMUNITY): Payer: Self-pay

## 2024-03-30 MED ORDER — SILDENAFIL CITRATE 20 MG PO TABS: 10.0000 mg | ORAL_TABLET | Freq: Three times a day (TID) | ORAL | Status: AC

## 2024-03-30 NOTE — Telephone Encounter (Signed)
 Patient called to report that the Sildenafil  is too much for her. She reports that it makes her very fatigue and weak. She wants to know if she can decrease from tid to bid or even every day. Please advise.

## 2024-03-30 NOTE — Addendum Note (Signed)
 Addended by: ALVAN COPA R on: 03/30/2024 12:07 PM   Modules accepted: Orders

## 2024-03-30 NOTE — Telephone Encounter (Signed)
Patient advised and verbalized understanding. Med list updated to reflect changes.  ? ?

## 2024-04-17 ENCOUNTER — Ambulatory Visit (HOSPITAL_COMMUNITY): Admitting: Cardiology

## 2024-05-08 ENCOUNTER — Ambulatory Visit: Admitting: Podiatry
